# Patient Record
Sex: Female | Born: 1950 | Race: White | Hispanic: No | Marital: Married | Smoking: Never smoker
Health system: Southern US, Community
[De-identification: ages and names within clinical notes are randomized; demographics above are authoritative.]

## PROBLEM LIST (undated history)

## (undated) DIAGNOSIS — E119 Type 2 diabetes mellitus without complications: Secondary | ICD-10-CM

## (undated) DIAGNOSIS — Z8719 Personal history of other diseases of the digestive system: Secondary | ICD-10-CM

## (undated) DIAGNOSIS — E785 Hyperlipidemia, unspecified: Secondary | ICD-10-CM

## (undated) DIAGNOSIS — K5790 Diverticulosis of intestine, part unspecified, without perforation or abscess without bleeding: Secondary | ICD-10-CM

## (undated) HISTORY — DX: Diverticulosis of intestine, part unspecified, without perforation or abscess without bleeding: K57.90

## (undated) HISTORY — DX: Type 2 diabetes mellitus without complications: E11.9

## (undated) HISTORY — DX: Personal history of other diseases of the digestive system: Z87.19

## (undated) HISTORY — DX: Hyperlipidemia, unspecified: E78.5

## (undated) HISTORY — PX: CHOLECYSTECTOMY: SHX55

---

## 1998-06-14 ENCOUNTER — Ambulatory Visit (HOSPITAL_COMMUNITY): Admission: RE | Admit: 1998-06-14 | Discharge: 1998-06-14 | Payer: Self-pay | Admitting: Obstetrics and Gynecology

## 1998-06-14 ENCOUNTER — Encounter: Payer: Self-pay | Admitting: Obstetrics and Gynecology

## 1999-08-15 ENCOUNTER — Encounter: Payer: Self-pay | Admitting: Obstetrics and Gynecology

## 1999-08-15 ENCOUNTER — Ambulatory Visit (HOSPITAL_COMMUNITY): Admission: RE | Admit: 1999-08-15 | Discharge: 1999-08-15 | Payer: Self-pay | Admitting: Obstetrics and Gynecology

## 1999-08-23 ENCOUNTER — Encounter: Payer: Self-pay | Admitting: Obstetrics and Gynecology

## 1999-08-23 ENCOUNTER — Ambulatory Visit (HOSPITAL_COMMUNITY): Admission: RE | Admit: 1999-08-23 | Discharge: 1999-08-23 | Payer: Self-pay

## 1999-11-16 ENCOUNTER — Other Ambulatory Visit: Admission: RE | Admit: 1999-11-16 | Discharge: 1999-11-16 | Payer: Self-pay | Admitting: Obstetrics and Gynecology

## 2000-10-01 ENCOUNTER — Ambulatory Visit (HOSPITAL_COMMUNITY): Admission: RE | Admit: 2000-10-01 | Discharge: 2000-10-01 | Payer: Self-pay | Admitting: Obstetrics and Gynecology

## 2000-10-01 ENCOUNTER — Encounter: Payer: Self-pay | Admitting: Obstetrics and Gynecology

## 2000-11-10 ENCOUNTER — Other Ambulatory Visit: Admission: RE | Admit: 2000-11-10 | Discharge: 2000-11-10 | Payer: Self-pay | Admitting: Obstetrics and Gynecology

## 2001-08-12 HISTORY — PX: OTHER SURGICAL HISTORY: SHX169

## 2001-11-16 ENCOUNTER — Other Ambulatory Visit: Admission: RE | Admit: 2001-11-16 | Discharge: 2001-11-16 | Payer: Self-pay | Admitting: Obstetrics and Gynecology

## 2002-11-11 ENCOUNTER — Other Ambulatory Visit: Admission: RE | Admit: 2002-11-11 | Discharge: 2002-11-11 | Payer: Self-pay | Admitting: Obstetrics and Gynecology

## 2003-08-13 HISTORY — PX: COLONOSCOPY: SHX174

## 2003-09-02 ENCOUNTER — Encounter (INDEPENDENT_AMBULATORY_CARE_PROVIDER_SITE_OTHER): Payer: Self-pay | Admitting: Specialist

## 2003-09-02 ENCOUNTER — Ambulatory Visit (HOSPITAL_COMMUNITY): Admission: RE | Admit: 2003-09-02 | Discharge: 2003-09-02 | Payer: Self-pay | Admitting: *Deleted

## 2003-11-14 ENCOUNTER — Other Ambulatory Visit: Admission: RE | Admit: 2003-11-14 | Discharge: 2003-11-14 | Payer: Self-pay | Admitting: Obstetrics and Gynecology

## 2004-11-14 ENCOUNTER — Other Ambulatory Visit: Admission: RE | Admit: 2004-11-14 | Discharge: 2004-11-14 | Payer: Self-pay | Admitting: *Deleted

## 2005-11-18 ENCOUNTER — Other Ambulatory Visit: Admission: RE | Admit: 2005-11-18 | Discharge: 2005-11-18 | Payer: Self-pay | Admitting: Obstetrics & Gynecology

## 2006-11-24 ENCOUNTER — Other Ambulatory Visit: Admission: RE | Admit: 2006-11-24 | Discharge: 2006-11-24 | Payer: Self-pay | Admitting: Obstetrics & Gynecology

## 2007-04-08 ENCOUNTER — Ambulatory Visit: Payer: Self-pay | Admitting: Gastroenterology

## 2007-04-16 ENCOUNTER — Encounter: Payer: Self-pay | Admitting: Gastroenterology

## 2007-04-16 ENCOUNTER — Ambulatory Visit (HOSPITAL_COMMUNITY): Admission: RE | Admit: 2007-04-16 | Discharge: 2007-04-16 | Payer: Self-pay | Admitting: Gastroenterology

## 2007-04-24 ENCOUNTER — Ambulatory Visit: Payer: Self-pay | Admitting: Gastroenterology

## 2007-11-09 DIAGNOSIS — E118 Type 2 diabetes mellitus with unspecified complications: Secondary | ICD-10-CM | POA: Insufficient documentation

## 2007-11-09 DIAGNOSIS — Z8719 Personal history of other diseases of the digestive system: Secondary | ICD-10-CM | POA: Insufficient documentation

## 2007-11-09 DIAGNOSIS — E119 Type 2 diabetes mellitus without complications: Secondary | ICD-10-CM

## 2007-11-09 DIAGNOSIS — K222 Esophageal obstruction: Secondary | ICD-10-CM | POA: Insufficient documentation

## 2007-11-09 HISTORY — DX: Personal history of other diseases of the digestive system: Z87.19

## 2007-11-09 HISTORY — DX: Type 2 diabetes mellitus without complications: E11.9

## 2007-11-26 ENCOUNTER — Other Ambulatory Visit: Admission: RE | Admit: 2007-11-26 | Discharge: 2007-11-26 | Payer: Self-pay | Admitting: Obstetrics & Gynecology

## 2008-12-13 ENCOUNTER — Other Ambulatory Visit: Admission: RE | Admit: 2008-12-13 | Discharge: 2008-12-13 | Payer: Self-pay | Admitting: Obstetrics and Gynecology

## 2010-12-25 NOTE — Assessment & Plan Note (Signed)
[facility]                         GASTROENTEROLOGY OFFICE NOTE   NAME:Ann Griffith, Ann Griffith                      MRN:          [id number]  DATE:04/08/2007                            DOB:          Mar 17, 1951    PRIMARY CARE PHYSICIAN:  Dr. Rema Fendt at [facility].   REASON FOR VISIT:  Dysphagia.   HISTORY OF PRESENT ILLNESS:  Ann Griffith is a very pleasant 60 year old  woman who has had at least 4 years of mild intermittent dysphagia. This  is solid food only. She says it happens approximately 10 times per year,  does not seem to be getting more frequent, but she did have a more  severe episode about a week ago where the food took about 15 minutes to  clear once it was stuck in her lower esophagus as it usually gets stuck.  She ended up vomiting up and that relieved the symptoms. She has no GERD  symptoms. She is never on PPIs.   REVIEW OF SYSTEMS:  Her weight is essentially stable over the past year.  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   PAST MEDICAL HISTORY:  Status post cholecystectomy in 1976, diabetes for  a year and a half.   CURRENT MEDICATIONS:  Premarin, Prometrium, fish oil.   ALLERGIES:  AMOXICILLIN.   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 Non  smoker, drinks alcohol intermittently.   FAMILY HISTORY:  No colon cancer, colon polyps in family. She herself  had a colonoscopy in 2005. She does not remember who did it but she was  told she had polyps.   PHYSICAL EXAMINATION:  She is 5 feet 7 inches, 191 pounds, blood  pressure 122/78, pulse 68.  CONSTITUTIONALLY: Generally well-appearing.  NEUROLOGIC: Alert and oriented x3.  EYES: Extraocular movements intact.  MOUTH: Oropharynx moist, no lesions.  NECK: Supple, no lymphadenopathy.  CARDIOVASCULAR: HEART: Regular rate and rhythm.  LUNGS: Clear to auscultation bilaterally.  ABDOMEN:  Soft, nontender, nondistended.  EXTREMITIES: No lower extremity edema.  SKIN: No rashes or lesions on visible extremities.   ASSESSMENT/PLAN:  A 60 year old woman with non progressive intermittent  solid food dysphagia for at least 4 years.   I suspect this is a benign etiology from peptic stricturing or perhaps a  lower esophageal ring such as a Schatzki's ring. I see no reason for any  further blood test or imaging studies and I will proceed directly with  upper endoscopy at her soonest convenience, and we will plan for balloon  dilation at that time. She does not want to do this for 2 weeks. She is  going on vacation. I instructed her that in the meantime she should chew  her food as carefully as possible and try to eat slowly. Since she has  no gastroesophageal reflux disease symptoms, I would not put her on a  PPI unless I see esophagitis at the time of her upper endoscopy.     Rachael Fee, MD  Electronically Signed    DPJ/MedQ  DD:  04/08/2007  DT: 04/09/2007  Job #: [id number]

## 2011-04-24 DIAGNOSIS — K635 Polyp of colon: Secondary | ICD-10-CM | POA: Insufficient documentation

## 2011-04-24 DIAGNOSIS — E1169 Type 2 diabetes mellitus with other specified complication: Secondary | ICD-10-CM | POA: Insufficient documentation

## 2011-05-17 LAB — TSH: TSH: 2.9 u[IU]/mL (ref <=5.90)

## 2011-12-11 LAB — LIPID PANEL: Cholesterol: 228 mg/dL — AB (ref 0–200)

## 2011-12-31 LAB — LIPID PANEL
LDL Cholesterol: 156 mg/dL
Triglycerides: 68 mg/dL (ref 40–160)

## 2011-12-31 LAB — HEMOGLOBIN A1C: Hgb A1c MFr Bld: 6.6 % — AB (ref 4.0–6.0)

## 2011-12-31 LAB — BASIC METABOLIC PANEL: Glucose: 133 mg/dL

## 2011-12-31 LAB — CBC AND DIFFERENTIAL: WBC: 5.8 10^3/mL

## 2012-03-30 ENCOUNTER — Ambulatory Visit (INDEPENDENT_AMBULATORY_CARE_PROVIDER_SITE_OTHER): Payer: 59 | Admitting: Family Medicine

## 2012-03-30 ENCOUNTER — Encounter: Payer: Self-pay | Admitting: Family Medicine

## 2012-03-30 VITALS — BP 124/66 | HR 86 | Temp 98.6°F | Resp 16 | Ht 66.5 in | Wt 197.0 lb

## 2012-03-30 DIAGNOSIS — G5600 Carpal tunnel syndrome, unspecified upper limb: Secondary | ICD-10-CM

## 2012-03-30 DIAGNOSIS — G5603 Carpal tunnel syndrome, bilateral upper limbs: Secondary | ICD-10-CM

## 2012-03-30 NOTE — Patient Instructions (Signed)
Given handout from Sports Med HEP book.

## 2012-03-30 NOTE — Progress Notes (Signed)
CC: Ann Griffith is a 61 y.o. female is here for Establish Care and Hand Pain   Subjective: HPI: New patient visit to establish care and do to bilateral hand pain.   bilateral hand pain however left side much more bothersome than the right. Pain is located in the thenar eminence often radiating distally into the thumb. Pain is described only as a discomfort. Pain is worse when painting or doing a bit of movements such as typing or tearing at her work. When pain is at its worst she feels weakness with flexion of the thumb and has trouble opening bottles. Admits to waking in the morning with numb sensation in the thenar region of her hand. No other discomfort, nor motor or sensory disturbances in either hand. This is been present for matter of months and seems to fluctuate on a weekly basis. She believes that she had similar pain in the past and wore a brace however unsure whether or not this helped. No other interventions as of yet. She denies any neck pain, motor sensory disturbances in the upper extremities, elbow pain, skin changes at the site of her discomfort, nor muscle atrophy in the affected hands.   Review Of Systems Outlined In HPI  Past Medical History  Diagnosis Date  . DIABETES MELLITUS, TYPE II 11/09/2007    Qualifier: Diagnosis of  By: Alesia Richards    . CHOLELITHIASIS, HX OF 11/09/2007    Qualifier: Diagnosis of  By: Alesia Richards       History reviewed. No pertinent family history.   History  Substance Use Topics  . Smoking status: Never Smoker   . Smokeless tobacco: Never Used  . Alcohol Use: No     Objective: Filed Vitals:   03/30/12 1534  BP: 124/66  Pulse: 86  Temp: 98.6 F (37 C)  Resp: 16    General: Alert and Oriented, No Acute Distress Neck: Full ROM without pain.   Extremities: No peripheral edema.  Strong peripheral pulses. No muscle atrophy of hands.  Full ROM and strength in bilateral hands/wrists without pain.  No joint enlargement  nor redness/warmth.  Negative finklestein's, no pain in anatomical snuff box.  Negative tinel's, negative Phalen's. Skin: Warm and dry.  Assessment & Plan: Ann Griffith was seen today for establish care and hand pain.  Diagnoses and associated orders for this visit:  Bilateral carpal tunnel syndrome  Discussed with patient that her symptoms and history are most consistent with carpal tunnel syndrome, mild suspicion of osteoarthritis. She was fitted with a wrist brace on the left wrist to immobilize the wrist at night, but can be worn all hours of the day it helping symptoms. We will use this as a diagnostic tool also comparing potential improvement to the right wrist not receiving intervention. Stretching exercises demonstrated and she was given a handout. If not improving in 2-3 weeks we'll consider nerve conduction studies to rule in or out carpal tunnel syndrome prior to more aggressive therapy.   Return in about 2 weeks (around 04/13/2012).  Requested Prescriptions    No prescriptions requested or ordered in this encounter

## 2012-03-31 ENCOUNTER — Encounter: Payer: Self-pay | Admitting: Family Medicine

## 2012-04-14 ENCOUNTER — Ambulatory Visit (INDEPENDENT_AMBULATORY_CARE_PROVIDER_SITE_OTHER): Payer: 59 | Admitting: Family Medicine

## 2012-04-14 ENCOUNTER — Encounter: Payer: Self-pay | Admitting: Family Medicine

## 2012-04-14 ENCOUNTER — Ambulatory Visit (INDEPENDENT_AMBULATORY_CARE_PROVIDER_SITE_OTHER): Payer: 59

## 2012-04-14 VITALS — BP 127/75 | HR 87 | Temp 99.0°F | Resp 16 | Wt 196.0 lb

## 2012-04-14 DIAGNOSIS — M545 Low back pain, unspecified: Secondary | ICD-10-CM

## 2012-04-14 DIAGNOSIS — IMO0002 Reserved for concepts with insufficient information to code with codable children: Secondary | ICD-10-CM

## 2012-04-14 DIAGNOSIS — X500XXA Overexertion from strenuous movement or load, initial encounter: Secondary | ICD-10-CM

## 2012-04-14 DIAGNOSIS — S39012A Strain of muscle, fascia and tendon of lower back, initial encounter: Secondary | ICD-10-CM

## 2012-04-14 DIAGNOSIS — M5137 Other intervertebral disc degeneration, lumbosacral region: Secondary | ICD-10-CM

## 2012-04-14 MED ORDER — HYDROCODONE-ACETAMINOPHEN 5-500 MG PO TABS: 1.0000 | ORAL_TABLET | Freq: Every evening | ORAL | Status: DC | PRN

## 2012-04-14 MED ORDER — HYDROCODONE-ACETAMINOPHEN 5-500 MG PO TABS: 1.0000 | ORAL_TABLET | Freq: Every evening | ORAL | Status: AC | PRN

## 2012-04-14 MED ORDER — METAXALONE 800 MG PO TABS: 800.0000 mg | ORAL_TABLET | Freq: Three times a day (TID) | ORAL | Status: AC | PRN

## 2012-04-14 MED ORDER — METAXALONE 800 MG PO TABS: 800.0000 mg | ORAL_TABLET | Freq: Three times a day (TID) | ORAL | Status: DC | PRN

## 2012-04-14 NOTE — Progress Notes (Signed)
CC: Ann Griffith is a 61 y.o. female is here for Back Pain   Subjective: HPI:  Left low back pain which came on immediately after she was carrying a vacuum cleaner down a flight of stairs. Pain has mild radiation of pain around the left buttock and down the back and side of the proximal left thigh no further than the knee. Pain is exacerbated by lying flat or with bending forward. Pain is been bothersome enough to where it gets in the way of her sleep. She's been using a heating pad without much help of her symptoms. She's never had this before. The pain is described as sharp. Presently 24 hours a day severity 10 out of 10 however she does feel that there is been some mild improvement from this morning now without any specific intervention. She denies any motor or sensory disturbances in the lower extremities, denies saddle paresthesia, bowel or bladder incontinence, knee pain, groin pain, hip pain, rash the site of her discomfort, GI discomfort, nor any urologic abnormalities.   Review Of Systems Outlined In HPI  Past Medical History  Diagnosis Date  . DIABETES MELLITUS, TYPE II 11/09/2007    Qualifier: Diagnosis of  By: Alesia Richards    . CHOLELITHIASIS, HX OF 11/09/2007    Qualifier: Diagnosis of  By: Alesia Richards       Family History  Problem Relation Age of Onset  . Diabetes Mother      History  Substance Use Topics  . Smoking status: Never Smoker   . Smokeless tobacco: Never Used  . Alcohol Use: No     Objective: Filed Vitals:   04/14/12 1510  BP: 127/75  Pulse: 87  Temp: 99 F (37.2 C)  Resp: 16    General: Alert and Oriented, No Acute Distress Cardiac: Regular rate and rhythm.  Abdomen: Soft nontender Extremities: No peripheral edema.  Strong peripheral pulses.  MSK: Negative straight leg raise, negative log roll, no pain with internal or external rotation of the femur, stork test positive on the left, FABER negative. Full range of motion and  strength in both lower extremities. Normal gait. Skin: Warm and dry.  Assessment & Plan: [name] was seen today for back pain.  Diagnoses and associated orders for this visit:  Left low back pain - DG Lumbar Spine Complete; Future  Back strain - Discontinue: HYDROcodone-acetaminophen (VICODIN) 5-500 MG per tablet; Take 1 tablet by mouth at bedtime as needed for pain. - Discontinue: metaxalone (SKELAXIN) 800 MG tablet; Take 1 tablet (800 mg total) by mouth 3 (three) times daily as needed for pain. - HYDROcodone-acetaminophen (VICODIN) 5-500 MG per tablet; Take 1 tablet by mouth at bedtime as needed for pain. - metaxalone (SKELAXIN) 800 MG tablet; Take 1 tablet (800 mg total) by mouth 3 (three) times daily as needed for pain.    Plain films of the back not showing any acute bony abnormality. We'll treat as muscle/back strain sprain with above medications, encourage patient to stay relatively active as tolerated. Signs and symptoms requring emergent/urgent reevaluation were discussed with the patient. Return in 2 weeks to ensure improvement.  Return in about 2 weeks (around 04/28/2012).  Requested Prescriptions   Signed Prescriptions Disp Refills  . HYDROcodone-acetaminophen (VICODIN) 5-500 MG per tablet 30 tablet 0    Sig: Take 1 tablet by mouth at bedtime as needed for pain.  . metaxalone (SKELAXIN) 800 MG tablet 40 tablet 1    Sig: Take 1 tablet (800 mg total) by  mouth 3 (three) times daily as needed for pain.

## 2012-04-15 ENCOUNTER — Encounter: Payer: Self-pay | Admitting: Family Medicine

## 2012-04-16 ENCOUNTER — Encounter: Payer: Self-pay | Admitting: Family Medicine

## 2012-06-23 ENCOUNTER — Encounter: Payer: Self-pay | Admitting: Family Medicine

## 2012-06-23 DIAGNOSIS — E119 Type 2 diabetes mellitus without complications: Secondary | ICD-10-CM

## 2012-10-09 DIAGNOSIS — E559 Vitamin D deficiency, unspecified: Secondary | ICD-10-CM | POA: Insufficient documentation

## 2013-01-14 ENCOUNTER — Encounter: Payer: Self-pay | Admitting: *Deleted

## 2013-01-15 ENCOUNTER — Encounter: Payer: Self-pay | Admitting: Nurse Practitioner

## 2013-01-15 ENCOUNTER — Ambulatory Visit (INDEPENDENT_AMBULATORY_CARE_PROVIDER_SITE_OTHER): Payer: 59 | Admitting: Nurse Practitioner

## 2013-01-15 VITALS — BP 138/70 | HR 74 | Resp 14 | Ht 66.5 in | Wt 196.6 lb

## 2013-01-15 DIAGNOSIS — N952 Postmenopausal atrophic vaginitis: Secondary | ICD-10-CM

## 2013-01-15 DIAGNOSIS — N898 Other specified noninflammatory disorders of vagina: Secondary | ICD-10-CM

## 2013-01-15 MED ORDER — FLUCONAZOLE 150 MG PO TABS: 150.0000 mg | ORAL_TABLET | Freq: Once | ORAL | Status: DC

## 2013-01-15 MED ORDER — ESTRADIOL 10 MCG VA TABS: 1.0000 | ORAL_TABLET | VAGINAL | Status: DC

## 2013-01-15 NOTE — Progress Notes (Signed)
Subjective:     Patient ID: Ann Griffith, female   DOB: 08-19-1950, 62 y.o.   MRN: [id number]  Vaginal Pain The patient's primary symptoms include genital itching. The current episode started in the past 7 days. The problem occurs intermittently. The problem has been waxing and waning. Pertinent negatives include no chills, dysuria, fever, flank pain, frequency, hematuria, nausea, urgency or vomiting. The symptoms are aggravated by intercourse. She has tried antifungals for the symptoms. The treatment provided moderate relief. She is postmenopausal.   Symptoms of vaginal itching and discharge that she used OTC Monistat for several wk's ago.  symptoms were improved then again after sexual activity had discomfort with slight itching again. She feels dryness with sex and feels like 'cuts' in the vagina. Uses lubrication prn. She is on HRT.  Getting ready to go to [place] for 2 wk's this weekend and wants to make sure no problems while she is gone. No change in partners.  Review of Systems  Constitutional: Negative.  Negative for fever and chills.  Respiratory: Negative.   Gastrointestinal: Negative.  Negative for nausea and vomiting.  Genitourinary: Positive for vaginal pain and dyspareunia. Negative for dysuria, urgency, frequency, hematuria and flank pain.       Symptoms are related to dyspareunia.  Neurological: Negative.   Psychiatric/Behavioral: Negative.        Objective:   Physical Exam  Constitutional: She is oriented to person, place, and time. She appears well-developed and well-nourished.  Pulmonary/Chest: Effort normal.  Abdominal: Soft. She exhibits no distension and no mass. There is no tenderness. There is no rebound and no guarding.  Genitourinary:  Very small amount of vaginal disharge.  Ph 4.5, NSS - atrophic changes no clue cells, KOH no yeast.  Neurological: She is alert and oriented to person, place, and time.  Psychiatric: She has a normal mood and affect. Her behavior  is normal. Judgment and thought content normal.       Assessment:    Atrophic Vaginitis Recent Yeast vaginitis    Plan:     Samples of Vagifem 2 X week until AEX in about a month Rx. For Diflucan to take with her on trip in case of a recurrent yeast infection Call back if symptoms worsens

## 2013-01-15 NOTE — Patient Instructions (Signed)
Call back if symptoms worsens  

## 2013-01-18 NOTE — Progress Notes (Signed)
Reviewed personally.  M. Suzanne Govani Radloff, MD.  

## 2013-04-21 ENCOUNTER — Ambulatory Visit: Payer: Self-pay | Admitting: Obstetrics & Gynecology

## 2013-04-23 ENCOUNTER — Encounter: Payer: Self-pay | Admitting: Obstetrics & Gynecology

## 2013-04-23 ENCOUNTER — Ambulatory Visit (INDEPENDENT_AMBULATORY_CARE_PROVIDER_SITE_OTHER): Payer: 59 | Admitting: Obstetrics & Gynecology

## 2013-04-23 VITALS — BP 138/70 | HR 60 | Resp 20 | Ht 66.5 in | Wt 198.0 lb

## 2013-04-23 DIAGNOSIS — E119 Type 2 diabetes mellitus without complications: Secondary | ICD-10-CM

## 2013-04-23 DIAGNOSIS — Z0000 Encounter for general adult medical examination without abnormal findings: Secondary | ICD-10-CM

## 2013-04-23 DIAGNOSIS — R6882 Decreased libido: Secondary | ICD-10-CM

## 2013-04-23 DIAGNOSIS — Z01419 Encounter for gynecological examination (general) (routine) without abnormal findings: Secondary | ICD-10-CM

## 2013-04-23 LAB — COMPREHENSIVE METABOLIC PANEL
Albumin: 4 g/dL (ref 3.5–5.2)
Alkaline Phosphatase: 40 U/L (ref 39–117)
BUN: 16 mg/dL (ref 6–23)
Calcium: 8.9 mg/dL (ref 8.4–10.5)
Chloride: 105 mEq/L (ref 96–112)
Glucose, Bld: 144 mg/dL — ABNORMAL HIGH (ref 70–99)
Potassium: 4.2 mEq/L (ref 3.5–5.3)

## 2013-04-23 LAB — HEMOGLOBIN A1C
Hgb A1c MFr Bld: 7.6 % — ABNORMAL HIGH (ref <5.7)
Mean Plasma Glucose: 171 mg/dL — ABNORMAL HIGH (ref <117)

## 2013-04-23 LAB — POCT URINALYSIS DIPSTICK
Bilirubin, UA: NEGATIVE
Ketones, UA: NEGATIVE
pH, UA: 5

## 2013-04-23 LAB — LIPID PANEL
HDL: 62 mg/dL (ref >39)
LDL Cholesterol: 129 mg/dL — ABNORMAL HIGH (ref 0–99)
Triglycerides: 76 mg/dL (ref <150)

## 2013-04-23 MED ORDER — TESTOSTERONE PROPIONATE 2 % TD OINT: TOPICAL_OINTMENT | TRANSDERMAL | Status: DC

## 2013-04-23 MED ORDER — ESTRADIOL-NORETHINDRONE ACET 1-0.5 MG PO TABS: 1.0000 | ORAL_TABLET | Freq: Every day | ORAL | Status: DC

## 2013-04-23 NOTE — Progress Notes (Signed)
62 y.o. G1P1 MarriedCaucasianF here for annual exam.  Noticing vulvar itching.  Tried olive oil, vagisil, vit E lotion, and OTC product for itching.  Nothing helped.  No bleeding or discharge.  No odor.    Patient's last menstrual period was 08/12/2000.          Sexually active: yes  The current method of family planning is none.    Exercising: no  not regularly Smoker:  no  Health Maintenance: Pap:  03/26/12 WNL/negative HR HPV History of abnormal Pap:  no MMG:  01/28/12 normal/aware due, will make appointment Colonoscopy:  2009-scheduled for 10/14 BMD:   6/09 (normal) TDaP:  12/21/09 Zostavax: done Screening Labs: done today, Hb today: 12.5, Urine today: WBC-+1, RBC-+1   reports that she has never smoked. She has never used smokeless tobacco. She reports that she drinks about 0.5 ounces of alcohol per week. She reports that she does not use illicit drugs.  Past Medical History  Diagnosis Date  . DIABETES MELLITUS, TYPE II 11/09/2007    Qualifier: Diagnosis of  By: Alesia Richards    . CHOLELITHIASIS, HX OF 11/09/2007    Qualifier: Diagnosis of  By: Alesia Richards    . Hyperlipidemia   . Diverticulosis     Past Surgical History  Procedure Laterality Date  . Cholecystectomy    . Colonoscopy  08/2003  . Bone spur removed  2003    left heel    Current Outpatient Prescriptions  Medication Sig Dispense Refill  . aspirin 81 MG tablet Take 81 mg by mouth daily.      . cholecalciferol (VITAMIN D) 1000 UNITS tablet Take 1,000 Units by mouth 3 (three) times daily.       . Cinnamon 500 MG capsule Take 1,000 mg by mouth daily.      . Estradiol 10 MCG TABS Place 1 tablet (10 mcg total) vaginally 2 (two) times a week. Lot # [id number] Exp. 02/2105  8 tablet  11  . estradiol-norethindrone (ACTIVELLA) 1-0.5 MG per tablet Take 1 tablet by mouth daily.      . fish oil-omega-3 fatty acids 1000 MG capsule Take 1 g by mouth daily.      . Red Yeast Rice 600 MG CAPS Take by mouth daily.       . Testosterone Propionate 2 % OINT Place onto the skin 2 (two) times a week.       No current facility-administered medications for this visit.    Family History  Problem Relation Age of Onset  . Diabetes Mother     ROS:  Pertinent items are noted in HPI.  Otherwise, a comprehensive ROS was negative.  Exam:   BP 138/70  Pulse 60  Resp 20  Ht 5' 6.5" (1.689 m)  Wt 198 lb (89.812 kg)  BMI 31.48 kg/m2  LMP 08/12/2000  Weight change: +2  Height: 5' 6.5" (168.9 cm)  Ht Readings from Last 3 Encounters:  04/23/13 5' 6.5" (1.689 m)  01/15/13 5' 6.5" (1.689 m)  03/30/12 5' 6.5" (1.689 m)    General appearance: alert, cooperative and appears stated age Head: Normocephalic, without obvious abnormality, atraumatic Neck: no adenopathy, supple, symmetrical, trachea midline and thyroid normal to inspection and palpation Lungs: clear to auscultation bilaterally Breasts: normal appearance, no masses or tenderness Heart: regular rate and rhythm Abdomen: soft, non-tender; bowel sounds normal; no masses,  no organomegaly Extremities: extremities normal, atraumatic, no cyanosis or edema Skin: Skin color, texture, turgor normal. No rashes or lesions  Lymph nodes: Cervical, supraclavicular, and axillary nodes normal. No abnormal inguinal nodes palpated Neurologic: Grossly normal   Pelvic: External genitalia:  no lesions              Urethra:  normal appearing urethra with no masses, tenderness or lesions              Bartholins and Skenes: normal                 Vagina: normal appearing vagina with normal color and discharge, no lesions              Cervix: no lesions              Pap taken: no Bimanual Exam:  Uterus:  normal size, contour, position, consistency, mobility, non-tender              Adnexa: normal adnexa and no mass, fullness, tenderness               Rectovaginal: Confirms               Anus:  normal sphincter tone, no lesions  A:  Well Woman with normal exam PMP on  HRT Diet controlled DM, PCP has moved Sun exposure--sees derm yearly Decreased libido  P:   Mammogram yearly pap smear with neg HR HPV 8/13.  No Pap today. CMP, TSH, Vit D, Lipids Total testosterone HbA1C Continue HRT 1.0/0.5mg  daily #90/4RF Topical testosterone 2% ointment twice weekly.  #60 grams/1 RF. return annually or prn  An After Visit Summary was printed and given to the patient.

## 2013-04-23 NOTE — Addendum Note (Signed)
Addended by: Jerene Bears on: 04/23/2013 02:23 PM   Modules accepted: Orders

## 2013-04-23 NOTE — Addendum Note (Signed)
Addended by: Jerene Bears on: 04/23/2013 02:25 PM   Modules accepted: Orders

## 2013-04-23 NOTE — Patient Instructions (Addendum)

## 2013-04-24 LAB — TESTOSTERONE: Testosterone: 49 ng/dL (ref 10–70)

## 2013-04-27 ENCOUNTER — Telehealth: Payer: Self-pay

## 2013-04-27 NOTE — Telephone Encounter (Signed)
[email or web address]

## 2013-04-29 ENCOUNTER — Telehealth: Payer: Self-pay

## 2013-04-29 NOTE — Telephone Encounter (Signed)
[email or web address]

## 2013-04-29 NOTE — Telephone Encounter (Signed)
Message copied by Elisha Headland on Thu Apr 29, 2013  9:25 AM ------      Message from: Jerene Bears      Created: Wed Apr 28, 2013  2:08 PM       Please call.  HbA1C 7.6.  Diabetes not controlled.  Needs PCP (old one moved).  She needs to be on medication for this.  Cholesterol Ok.  LDLs a little up but ok to watch.  TSH ok.  Vit D ok.  CMP ok. ------

## 2013-04-30 ENCOUNTER — Ambulatory Visit: Payer: 59 | Admitting: Physician Assistant

## 2013-04-30 NOTE — Telephone Encounter (Signed)
Patient did not make appointment and so it was cancelled. Appointment just needs rescheduled.

## 2013-05-03 NOTE — Telephone Encounter (Signed)
New appointment made for patient 05/05/13. Left detailed message on voicemail.

## 2013-05-04 NOTE — Telephone Encounter (Signed)
Pt has some more questions regarding her  results

## 2013-05-05 ENCOUNTER — Ambulatory Visit (INDEPENDENT_AMBULATORY_CARE_PROVIDER_SITE_OTHER): Payer: 59 | Admitting: Physician Assistant

## 2013-05-05 ENCOUNTER — Encounter: Payer: Self-pay | Admitting: Physician Assistant

## 2013-05-05 VITALS — BP 127/71 | HR 77 | Wt 201.0 lb

## 2013-05-05 DIAGNOSIS — I7300 Raynaud's syndrome without gangrene: Secondary | ICD-10-CM

## 2013-05-05 DIAGNOSIS — E119 Type 2 diabetes mellitus without complications: Secondary | ICD-10-CM

## 2013-05-05 LAB — POCT UA - MICROALBUMIN

## 2013-05-05 MED ORDER — METFORMIN HCL 500 MG PO TABS: ORAL_TABLET | ORAL | Status: DC

## 2013-05-05 MED ORDER — AMLODIPINE BESYLATE 2.5 MG PO TABS: 2.5000 mg | ORAL_TABLET | Freq: Every day | ORAL | Status: DC

## 2013-05-05 NOTE — Telephone Encounter (Signed)
Message left to return call to nurse at [phone].   

## 2013-05-05 NOTE — Patient Instructions (Addendum)
Diabetes Meal Planning Guide The diabetes meal planning guide is a tool to help you plan your meals and snacks. It is important for people with diabetes to manage their blood glucose (sugar) levels. Choosing the right foods and the right amounts throughout your day will help control your blood glucose. Eating right can even help you improve your blood pressure and reach or maintain a healthy weight. CARBOHYDRATE COUNTING MADE EASY When you eat carbohydrates, they turn to sugar. This raises your blood glucose level. Counting carbohydrates can help you control this level so you feel better. When you plan your meals by counting carbohydrates, you can have more flexibility in what you eat and balance your medicine with your food intake. Carbohydrate counting simply means adding up the total amount of carbohydrate grams in your meals and snacks. Try to eat about the same amount at each meal. Foods with carbohydrates are listed below. Each portion below is 1 carbohydrate serving or 15 grams of carbohydrates. Ask your dietician how many grams of carbohydrates you should eat at each meal or snack. Grains and Starches  1 slice bread.   English muffin or hotdog/hamburger bun.   cup cold cereal (unsweetened).   cup cooked pasta or rice.   cup starchy vegetables (corn, potatoes, peas, beans, winter squash).  1 tortilla (6 inches).   bagel.  1 waffle or pancake (size of a CD).   cup cooked cereal.  4 to 6 small crackers. *Whole grain is recommended. Fruit  1 cup fresh unsweetened berries, melon, papaya, pineapple.  1 small fresh fruit.   banana or mango.   cup fruit juice (4 oz unsweetened).   cup canned fruit in natural juice or water.  2 tbs dried fruit.  12 to 15 grapes or cherries. Milk and Yogurt  1 cup fat-free or 1% milk.  1 cup soy milk.  6 oz light yogurt with sugar-free sweetener.  6 oz low-fat soy yogurt.  6 oz plain yogurt. Vegetables  1 cup raw or  cup  cooked is counted as 0 carbohydrates or a "free" food.  If you eat 3 or more servings at 1 meal, count them as 1 carbohydrate serving. Other Carbohydrates   oz chips or pretzels.   cup ice cream or frozen yogurt.   cup sherbet or sorbet.  2 inch square cake, no frosting.  1 tbs honey, sugar, jam, jelly, or syrup.  2 small cookies.  3 squares of graham crackers.  3 cups popcorn.  6 crackers.  1 cup broth-based soup.  Count 1 cup casserole or other mixed foods as 2 carbohydrate servings.  Foods with less than 20 calories in a serving may be counted as 0 carbohydrates or a "free" food. You may want to purchase a book or computer software that lists the carbohydrate gram counts of different foods. In addition, the nutrition facts panel on the labels of the foods you eat are a good source of this information. The label will tell you how big the serving size is and the total number of carbohydrate grams you will be eating per serving. Divide this number by 15 to obtain the number of carbohydrate servings in a portion. Remember, 1 carbohydrate serving equals 15 grams of carbohydrate. SERVING SIZES Measuring foods and serving sizes helps you make sure you are getting the right amount of food. The list below tells how big or small some common serving sizes are.  1 oz.........4 stacked dice.  3 oz.........Deck of cards.  1 tsp........Tip   of little finger.  1 tbs......[name] KitchenMarland KitchenThumb.  2 tbs.......[name] KitchenGolf ball.   cup......[name] KitchenHalf of a fist.  1 cup.......[name] KitchenA fist. SAMPLE DIABETES MEAL PLAN Below is a sample meal plan that includes foods from the grain and starches, dairy, vegetable, fruit, and meat groups. A dietician can individualize a meal plan to fit your calorie needs and tell you the number of servings needed from each food group. However, controlling the total amount of carbohydrates in your meal or snack is more important than making sure you include all of the food groups at every  meal. You may interchange carbohydrate containing foods (dairy, starches, and fruits). The meal plan below is an example of a 2000 calorie diet using carbohydrate counting. This meal plan has 17 carbohydrate servings. Breakfast  1 cup oatmeal (2 carb servings).   cup light yogurt (1 carb serving).  1 cup blueberries (1 carb serving).   cup almonds. Snack  1 large apple (2 carb servings).  1 low-fat string cheese stick. Lunch  Chicken breast salad.  1 cup spinach.   cup chopped tomatoes.  2 oz chicken breast, sliced.  2 tbs low-fat [place] dressing.  12 whole-wheat crackers (2 carb servings).  12 to 15 grapes (1 carb serving).  1 cup low-fat milk (1 carb serving). Snack  1 cup carrots.   cup hummus (1 carb serving). Dinner  3 oz broiled salmon.  1 cup brown rice (3 carb servings). Snack  1  cups steamed broccoli (1 carb serving) drizzled with 1 tsp olive oil and lemon juice.  1 cup light pudding (2 carb servings). DIABETES MEAL PLANNING WORKSHEET Your dietician can use this worksheet to help you decide how many servings of foods and what types of foods are right for you.  BREAKFAST Food Group and Servings / Carb Servings Grain/Starches __________________________________ Dairy __________________________________________ Vegetable ______________________________________ Fruit ___________________________________________ Meat __________________________________________ Fat ____________________________________________ LUNCH Food Group and Servings / Carb Servings Grain/Starches ___________________________________ Dairy ___________________________________________ Fruit ____________________________________________ Meat ___________________________________________ Fat _____________________________________________ Laural Golden Food Group and Servings / Carb Servings Grain/Starches ___________________________________ Dairy  ___________________________________________ Fruit ____________________________________________ Meat ___________________________________________ Fat _____________________________________________ SNACKS Food Group and Servings / Carb Servings Grain/Starches ___________________________________ Dairy ___________________________________________ Vegetable _______________________________________ Fruit ____________________________________________ Meat ___________________________________________ Fat _____________________________________________ DAILY TOTALS Starches _________________________ Vegetable ________________________ Fruit ____________________________ Dairy ____________________________ Meat ____________________________ Fat ______________________________ Document Released: 04/25/2005 Document Revised: 10/21/2011 Document Reviewed: 03/06/2009 [employer] Patient Information 2014 [employer].   Raynaud's Syndrome Raynaud's Syndrome is a disorder of the blood vessels in your hands and feet. It occurs when small arteries of the arms/hands or legs/feet become sensitive to cold or emotional upset. This causes the arteries to constrict, or narrow, and reduces blood flow to the area. The color in the fingers or toes changes from white to bluish to red and this is not usually painful. There may be numbness and tingling. Sores on the skin (ulcers) can form. Symptoms are usually relieved by warming. HOME CARE INSTRUCTIONS   Avoid exposure to cold. Keep your whole body warm and dry. Dress in layers. Wear mittens or gloves when handling ice or frozen food and when outdoors. Use holders for glasses or cans containing cold drinks. If possible, stay indoors during cold weather.  Limit your use of caffeine. Switch to decaffeinated coffee, tea, and soda pop. Avoid chocolate.  Avoid smoking or being around cigarette smoke. Smoke will make symptoms worse.  Wear loose fitting socks and comfortable, roomy  shoes.  Avoid vibrating tools and machinery.  If possible, avoid stressful and emotional situations. Exercise, meditation and  yoga may help you cope with stress. Biofeedback may be useful.  Ask your caregiver about medicine (calcium channel blockers) that may control Raynaud's phenomena. SEEK MEDICAL CARE IF:   Your discomfort becomes worse, despite conservative treatment.  You develop sores on your fingers and toes that do not heal. Document Released: 07/26/2000 Document Revised: 10/21/2011 Document Reviewed: 08/02/2008 [facility] Patient Information 2014 [place], [place].

## 2013-05-05 NOTE — Progress Notes (Signed)
  Subjective:    Patient ID: Ann Griffith, female    DOB: 1950/10/17, 62 y.o.   MRN: [id number]  HPI Patient is a 62 year old female who presents to the clinic to discuss elevated A1c. Patient was seen by her GYN earlier this month on September 12. She had fasting labs done in A1c was elevated at 7.6. She has never been on diabetic medication. She does have a glucometer at home and checks her sugars occasionally. Her fasting sugars are usually around 140. She denies any chest pains, palpitations, vision blurriness or tingling in feet. Her last eye visit was last November. She plans on making another appointment before the end of the year. Patient wears glasses but no other abnormality seen. Patient admits to a healthy diet. She was walking regularly before the summer but has since stopped walking. She has been to the diabetic nutrition class before.  Patient does have ongoing cold toes that sometimes turn blue. She's had this for many years. Does not seem to be getting better or worse. She has never tried anything to make better.   Review of Systems     Objective:   Physical Exam  Constitutional: She is oriented to person, place, and time. She appears well-developed and well-nourished.  HENT:  Head: Normocephalic and atraumatic.  Cardiovascular: Normal rate, regular rhythm and normal heart sounds.   Pulmonary/Chest: Effort normal and breath sounds normal.  Neurological: She is alert and oriented to person, place, and time.  Skin: Skin is warm and dry.  Psychiatric: She has a normal mood and affect.          Assessment & Plan:  Diabetes mellitus type 2-A1c was 7.6 this month. Patient was started on metformin 500 mg twice a day with meals. If patient able to tolerate medication well then he can increase to 1000 mg twice a day with food. Side effects of nausea and diarrhea were discussed. Patient had kidney function recently checked and creatinine was perfect. Patient was given a handout  on diabetic meal planning. Patient was offered a nutritionist referral. Patient declined stating she hurriedly spoken with nutritionist. Encouraged patient to decrease carbohydrates and increase protein. Patient encouraged to stay active and walking regularly. Recheck A1c in 3 months. Microalbumin was done today and normal. Patient will get a flu shot at work and reports early have pneumonia vaccine.  Raynauds syndrome- started patient on low dose of amlodipine 2.5mg  to see if helps with symptoms. Follow up as needed. Gave HO.

## 2013-05-07 NOTE — Telephone Encounter (Signed)
Patient had appointment 05/05/13 with primary care. Will close encounter.

## 2013-05-07 NOTE — Telephone Encounter (Signed)
Attempted phone call. Phone rang and rang. No answer.

## 2013-06-04 ENCOUNTER — Telehealth: Payer: Self-pay | Admitting: Obstetrics & Gynecology

## 2013-06-04 NOTE — Telephone Encounter (Signed)
Left Message To Call Back  

## 2013-06-04 NOTE — Telephone Encounter (Signed)
Patient needs a refill of her hormone replacement.  [facility] [place]

## 2013-06-07 ENCOUNTER — Encounter: Payer: Self-pay | Admitting: Family Medicine

## 2013-06-07 DIAGNOSIS — K5790 Diverticulosis of intestine, part unspecified, without perforation or abscess without bleeding: Secondary | ICD-10-CM | POA: Insufficient documentation

## 2013-06-07 NOTE — Telephone Encounter (Signed)
Left Message To Call Back  Re: Patient needing refill on HRT  04/23/13 Activella #90/4 refills was sent to [facility] 04/23/13 Testosterone Propionate 2% #60 gm/1 refill was sent to [facility]

## 2013-06-09 NOTE — Telephone Encounter (Signed)
Left Message To Call Back  

## 2013-06-14 NOTE — Telephone Encounter (Signed)
Contacted patient x3 regarding patient needing a refill of HRT  04/23/13 Activella #90/4 refills was sent to [facility]  04/23/13 Testosterone Propionate 2% #60 gm/1 refill was sent to [facility] pharmacy  Please advise on next step?

## 2013-06-14 NOTE — Telephone Encounter (Signed)
You have notified her and left a message.  Refills are done.  Ok to close encounter.

## 2013-07-06 ENCOUNTER — Encounter: Payer: Self-pay | Admitting: Family Medicine

## 2013-08-09 ENCOUNTER — Ambulatory Visit (INDEPENDENT_AMBULATORY_CARE_PROVIDER_SITE_OTHER): Payer: 59

## 2013-08-09 ENCOUNTER — Encounter: Payer: Self-pay | Admitting: Physician Assistant

## 2013-08-09 ENCOUNTER — Ambulatory Visit (INDEPENDENT_AMBULATORY_CARE_PROVIDER_SITE_OTHER): Payer: 59 | Admitting: Physician Assistant

## 2013-08-09 VITALS — BP 126/69 | HR 84 | Wt 190.0 lb

## 2013-08-09 DIAGNOSIS — I7300 Raynaud's syndrome without gangrene: Secondary | ICD-10-CM

## 2013-08-09 DIAGNOSIS — M25561 Pain in right knee: Secondary | ICD-10-CM

## 2013-08-09 DIAGNOSIS — M25569 Pain in unspecified knee: Secondary | ICD-10-CM

## 2013-08-09 DIAGNOSIS — E119 Type 2 diabetes mellitus without complications: Secondary | ICD-10-CM

## 2013-08-09 MED ORDER — METFORMIN HCL 500 MG PO TABS: ORAL_TABLET | ORAL | Status: DC

## 2013-08-09 NOTE — Patient Instructions (Addendum)
Raynaud's Syndrome Raynaud's Syndrome is a disorder of the blood vessels in your hands and feet. It occurs when small arteries of the arms/hands or legs/feet become sensitive to cold or emotional upset. This causes the arteries to constrict, or narrow, and reduces blood flow to the area. The color in the fingers or toes changes from white to bluish to red and this is not usually painful. There may be numbness and tingling. Sores on the skin (ulcers) can form. Symptoms are usually relieved by warming. HOME CARE INSTRUCTIONS   Avoid exposure to cold. Keep your whole body warm and dry. Dress in layers. Wear mittens or gloves when handling ice or frozen food and when outdoors. Use holders for glasses or cans containing cold drinks. If possible, stay indoors during cold weather.  Limit your use of caffeine. Switch to decaffeinated coffee, tea, and soda pop. Avoid chocolate.  Avoid smoking or being around cigarette smoke. Smoke will make symptoms worse.  Wear loose fitting socks and comfortable, roomy shoes.  Avoid vibrating tools and machinery.  If possible, avoid stressful and emotional situations. Exercise, meditation and yoga may help you cope with stress. Biofeedback may be useful.  Ask your caregiver about medicine (calcium channel blockers) that may control Raynaud's phenomena. SEEK MEDICAL CARE IF:   Your discomfort becomes worse, despite conservative treatment.  You develop sores on your fingers and toes that do not heal. Document Released: 07/26/2000 Document Revised: 10/21/2011 Document Reviewed: 08/02/2008 [facility] Patient Information 2014 [place], [place].  Knee Exercises EXERCISES RANGE OF MOTION(ROM) AND STRETCHING EXERCISES These exercises may help you when beginning to rehabilitate your injury. Your symptoms may resolve with or without further involvement from your physician, physical therapist or athletic trainer. While completing these exercises, remember:   Restoring tissue  flexibility helps normal motion to return to the joints. This allows healthier, less painful movement and activity.  An effective stretch should be held for at least 30 seconds.  A stretch should never be painful. You should only feel a gentle lengthening or release in the stretched tissue. STRETCH - Knee Extension, Prone  Lie on your stomach on a firm surface, such as a bed or countertop. Place your right / left knee and leg just beyond the edge of the surface. You may wish to place a towel under the far end of your right / left thigh for comfort.  Relax your leg muscles and allow gravity to straighten your knee. Your clinician may advise you to add an ankle weight if more resistance is helpful for you.  You should feel a stretch in the back of your right / left knee. Hold this position for __________ seconds. Repeat __________ times. Complete this stretch __________ times per day. * Your physician, physical therapist or athletic trainer may ask you to add ankle weight to enhance your stretch.  RANGE OF MOTION - Knee Flexion, Active  Lie on your back with both knees straight. (If this causes back discomfort, bend your opposite knee, placing your foot flat on the floor.)  Slowly slide your heel back toward your buttocks until you feel a gentle stretch in the front of your knee or thigh.  Hold for __________ seconds. Slowly slide your heel back to the starting position. Repeat __________ times. Complete this exercise __________ times per day.  STRETCH - Quadriceps, Prone   Lie on your stomach on a firm surface, such as a bed or padded floor.  Bend your right / left knee and grasp your ankle. If  you are unable to reach, your ankle or pant leg, use a belt around your foot to lengthen your reach.  Gently pull your heel toward your buttocks. Your knee should not slide out to the side. You should feel a stretch in the front of your thigh and/or knee.  Hold this position for __________  seconds. Repeat __________ times. Complete this stretch __________ times per day.  STRETCH  Hamstrings, Supine   Lie on your back. Loop a belt or towel over the ball of your right / left foot.  Straighten your right / left knee and slowly pull on the belt to raise your leg. Do not allow the right / left knee to bend. Keep your opposite leg flat on the floor.  Raise the leg until you feel a gentle stretch behind your right / left knee or thigh. Hold this position for __________ seconds. Repeat __________ times. Complete this stretch __________ times per day.  STRENGTHENING EXERCISES These exercises may help you when beginning to rehabilitate your injury. They may resolve your symptoms with or without further involvement from your physician, physical therapist or athletic trainer. While completing these exercises, remember:   Muscles can gain both the endurance and the strength needed for everyday activities through controlled exercises.  Complete these exercises as instructed by your physician, physical therapist or athletic trainer. Progress the resistance and repetitions only as guided.  You may experience muscle soreness or fatigue, but the pain or discomfort you are trying to eliminate should never worsen during these exercises. If this pain does worsen, stop and make certain you are following the directions exactly. If the pain is still present after adjustments, discontinue the exercise until you can discuss the trouble with your clinician. STRENGTH - Quadriceps, Isometrics  Lie on your back with your right / left leg extended and your opposite knee bent.  Gradually tense the muscles in the front of your right / left thigh. You should see either your knee cap slide up toward your hip or increased dimpling just above the knee. This motion will push the back of the knee down toward the floor/mat/bed on which you are lying.  Hold the muscle as tight as you can without increasing your pain  for __________ seconds.  Relax the muscles slowly and completely in between each repetition. Repeat __________ times. Complete this exercise __________ times per day.  STRENGTH - Quadriceps, Short Arcs   Lie on your back. Place a __________ inch towel roll under your knee so that the knee slightly bends.  Raise only your lower leg by tightening the muscles in the front of your thigh. Do not allow your thigh to rise.  Hold this position for __________ seconds. Repeat __________ times. Complete this exercise __________ times per day.  OPTIONAL ANKLE WEIGHTS: Begin with ____________________, but DO NOT exceed ____________________. Increase in 1 pound/0.5 kilogram increments.  STRENGTH - Quadriceps, Straight Leg Raises  Quality counts! Watch for signs that the quadriceps muscle is working to insure you are strengthening the correct muscles and not "cheating" by substituting with healthier muscles.  Lay on your back with your right / left leg extended and your opposite knee bent.  Tense the muscles in the front of your right / left thigh. You should see either your knee cap slide up or increased dimpling just above the knee. Your thigh may even quiver.  Tighten these muscles even more and raise your leg 4 to 6 inches off the floor. Hold for __________ seconds.  Keeping these muscles tense, lower your leg.  Relax the muscles slowly and completely in between each repetition. Repeat __________ times. Complete this exercise __________ times per day.  STRENGTH - Hamstring, Curls  Lay on your stomach with your legs extended. (If you lay on a bed, your feet may hang over the edge.)  Tighten the muscles in the back of your thigh to bend your right / left knee up to 90 degrees. Keep your hips flat on the bed/floor.  Hold this position for __________ seconds.  Slowly lower your leg back to the starting position. Repeat __________ times. Complete this exercise __________ times per day.  OPTIONAL  ANKLE WEIGHTS: Begin with ____________________, but DO NOT exceed ____________________. Increase in 1 pound/0.5 kilogram increments.  STRENGTH  Quadriceps, Squats  Stand in a door frame so that your feet and knees are in line with the frame.  Use your hands for balance, not support, on the frame.  Slowly lower your weight, bending at the hips and knees. Keep your lower legs upright so that they are parallel with the door frame. Squat only within the range that does not increase your knee pain. Never let your hips drop below your knees.  Slowly return upright, pushing with your legs, not pulling with your hands. Repeat __________ times. Complete this exercise __________ times per day.  STRENGTH - Quadriceps, Wall Slides  Follow guidelines for form closely. Increased knee pain often results from poorly placed feet or knees.  Lean against a smooth wall or door and walk your feet out 18-24 inches. Place your feet hip-width apart.  Slowly slide down the wall or door until your knees bend __________ degrees.* Keep your knees over your heels, not your toes, and in line with your hips, not falling to either side.  Hold for __________ seconds. Stand up to rest for __________ seconds in between each repetition. Repeat __________ times. Complete this exercise __________ times per day. * Your physician, physical therapist or athletic trainer will alter this angle based on your symptoms and progress. Document Released: 06/12/2005 Document Revised: 10/21/2011 Document Reviewed: 11/10/2008 [facility] Patient Information 2014 [place], [place].

## 2013-08-09 NOTE — Progress Notes (Signed)
   Subjective:    Patient ID: Ann Griffith, female    DOB: July 26, 1951, 62 y.o.   MRN: [id number]  HPI Pt presents to the clinic to follow up on Diabetes and discuss right knee pain.   DM- pt is taking 1000mg  metformin twice a day. She had some GI SE at first but they improved signifcantly after a couple of weeks. Pt has been eating much less and trying to make much better choices. She has lost 11lbs in 3 months. She continues to check her sugars mostly fasting and have been around 120 and sometimes less. Continues to have toes turning blue and very cold. Using amdopline and not noticed a lot of difference but with medication her feet have not hurt like they were.   Right knee pain started approximately 16 months ago when she slid outside. She never had knee evaluated. She is concerned that she is off and on discomfort of the lateral right knee. This past week he felt like it was bothering her more. Sometimes there is shooting pain in the lateral side of the to. She denies any ever giving way or he'll not she's going to fall. Nothing seems to make worse. She has not tried anything to make better. She has not taken any ibuprofen because she does not like to take medication.   Review of Systems     Objective:   Physical Exam  Constitutional: She is oriented to person, place, and time. She appears well-developed and well-nourished.  HENT:  Head: Normocephalic and atraumatic.  Cardiovascular: Normal rate, regular rhythm and normal heart sounds.   Pulmonary/Chest: Effort normal and breath sounds normal.  Musculoskeletal:  Range of motion of right knee normal and without pain. Some mild 1+ crepitus over her right knee. Patient able to reach full extension and flexion. No significant joint tenderness to palpation. Negative McMurray's. Patient points to discomfort over the more lateral side of knee close to where the IT band inserts.  Neurological: She is alert and oriented to person, place, and  time.  Psychiatric: She has a normal mood and affect. Her behavior is normal.          Assessment & Plan:  DM-  Lab Results  Component Value Date   HGBA1C 6.3 08/09/2013  down from 7.6. Great job. Continue on metformin and work on weight loss over the next 3 months and will consider dropping and down and potentially taking off medication if needed. Sent refill today.   Raynaud's syndrome-reassured patient that Norvasc was not decreasing her blood pressure significantly. She wants to stop for a month and see if there is any real benefit in taking daily. I discussed that time if you have that let me know if she wants a refill. Gave handout with home treatment on different ways to make symptoms better.  Right knee pain-will get x-ray today. I suspect there is some arthritis and possibly some tendinitis or IT band inflammation around the knee. I sent her home with some stretches for the knee. Encouraged ibuprofen regularly for the next couple of weeks. If not improving or worsening could consider MRI of the knee or a trial of physical therapy. Followup in 6 weeks.

## 2013-10-04 ENCOUNTER — Other Ambulatory Visit: Payer: Self-pay | Admitting: *Deleted

## 2013-10-04 ENCOUNTER — Telehealth: Payer: Self-pay | Admitting: Obstetrics & Gynecology

## 2013-10-04 MED ORDER — TESTOSTERONE PROPIONATE 2 % TD OINT: TOPICAL_OINTMENT | TRANSDERMAL | Status: DC

## 2013-10-04 NOTE — Telephone Encounter (Signed)
Patient requesting a refill on testosterone.  [place] [phone]

## 2013-10-04 NOTE — Telephone Encounter (Addendum)
Last AEX and refill 04/23/2013, 60g/1 refill Next appt 05/09/14.

## 2013-10-05 NOTE — Telephone Encounter (Signed)
Patient notified that rx was sent to pharmacy  ?

## 2013-10-28 ENCOUNTER — Other Ambulatory Visit: Payer: Self-pay | Admitting: Physician Assistant

## 2013-11-05 ENCOUNTER — Encounter: Payer: Self-pay | Admitting: Physician Assistant

## 2013-11-05 ENCOUNTER — Ambulatory Visit (INDEPENDENT_AMBULATORY_CARE_PROVIDER_SITE_OTHER): Payer: 59 | Admitting: Physician Assistant

## 2013-11-05 VITALS — BP 115/61 | HR 75 | Ht 67.0 in | Wt 188.0 lb

## 2013-11-05 DIAGNOSIS — E119 Type 2 diabetes mellitus without complications: Secondary | ICD-10-CM

## 2013-11-05 DIAGNOSIS — I7300 Raynaud's syndrome without gangrene: Secondary | ICD-10-CM

## 2013-11-05 LAB — POCT GLYCOSYLATED HEMOGLOBIN (HGB A1C): HEMOGLOBIN A1C: 6.5

## 2013-11-05 MED ORDER — METFORMIN HCL 500 MG PO TABS: ORAL_TABLET | ORAL | Status: DC

## 2013-11-05 MED ORDER — AMLODIPINE BESYLATE 2.5 MG PO TABS: ORAL_TABLET | ORAL | Status: DC

## 2013-11-05 MED ORDER — AMBULATORY NON FORMULARY MEDICATION: Status: DC

## 2013-11-05 NOTE — Progress Notes (Signed)
   Subjective:    Patient ID: Ann Griffith, female    DOB: 09/12/1950, 63 y.o.   MRN: [id number]  HPI Patient presents to the clinic to followup on diabetes.  DM-patient stooled well on metformin twice a day. Patient denies any hypoglycemic events. She does not check her sugars regularly but when she does it is fasting and usually around 120. She denies any exercise but does try to keep to a low sugar, low carb diet. She denies any increased thirst, neuropathy, vision changes. She does have a cold sensation in her feet from Raynaud syndrome that Norvasc is helping.   Review of Systems     Objective:   Physical Exam  Constitutional: She is oriented to person, place, and time. She appears well-developed and well-nourished.  HENT:  Head: Normocephalic and atraumatic.  Cardiovascular: Normal rate, regular rhythm and normal heart sounds.   Pulmonary/Chest: Effort normal and breath sounds normal.  Neurological: She is alert and oriented to person, place, and time.  Pedal pulses 2+ bilateral.  Skin: Skin is dry.  Bilateral feet are normal color with no cyanosis or color changes. Capillary refill is less than 3 seconds.    Psychiatric: She has a normal mood and affect. Her behavior is normal.          Assessment & Plan:  Diabetes Mellitus, controlled- A1C 6.5. Discussed with patient to stay on metformin daily. We have discussed tapering down his A1c continue to improve. It has actually gone up by .2 point. Continue with low carb, low sugar diet as well as staying active with exercise. I exam is up to date. Exam was normal with good sensation. Labs are up to date. We'll recheck in 6 months.  Raynaud syndrome-per patient Norvasc has helped with pain but now with cold sensation. She does feel like improving with warmer weather. Discuss circulation testing. Since pulses were normal as well as covering do not to light is negative. Continue on Norvasc 2.5 mg daily.

## 2014-04-25 ENCOUNTER — Telehealth: Payer: Self-pay | Admitting: Obstetrics & Gynecology

## 2014-04-25 NOTE — Telephone Encounter (Signed)
Left message to call back about rescheduled appointment

## 2014-04-27 ENCOUNTER — Ambulatory Visit: Payer: 59 | Admitting: Obstetrics & Gynecology

## 2014-05-02 ENCOUNTER — Ambulatory Visit (INDEPENDENT_AMBULATORY_CARE_PROVIDER_SITE_OTHER): Payer: 59 | Admitting: Physician Assistant

## 2014-05-02 ENCOUNTER — Encounter: Payer: Self-pay | Admitting: Physician Assistant

## 2014-05-02 VITALS — BP 130/75 | HR 79 | Ht 67.0 in | Wt 178.0 lb

## 2014-05-02 DIAGNOSIS — Z79899 Other long term (current) drug therapy: Secondary | ICD-10-CM

## 2014-05-02 DIAGNOSIS — H6990 Unspecified Eustachian tube disorder, unspecified ear: Secondary | ICD-10-CM | POA: Insufficient documentation

## 2014-05-02 DIAGNOSIS — E119 Type 2 diabetes mellitus without complications: Secondary | ICD-10-CM

## 2014-05-02 DIAGNOSIS — H6980 Other specified disorders of Eustachian tube, unspecified ear: Secondary | ICD-10-CM | POA: Insufficient documentation

## 2014-05-02 DIAGNOSIS — H9209 Otalgia, unspecified ear: Secondary | ICD-10-CM

## 2014-05-02 DIAGNOSIS — I7300 Raynaud's syndrome without gangrene: Secondary | ICD-10-CM

## 2014-05-02 DIAGNOSIS — H6981 Other specified disorders of Eustachian tube, right ear: Secondary | ICD-10-CM

## 2014-05-02 DIAGNOSIS — Z13220 Encounter for screening for lipoid disorders: Secondary | ICD-10-CM

## 2014-05-02 DIAGNOSIS — Z0189 Encounter for other specified special examinations: Secondary | ICD-10-CM

## 2014-05-02 DIAGNOSIS — Z23 Encounter for immunization: Secondary | ICD-10-CM

## 2014-05-02 DIAGNOSIS — Z0000 Encounter for general adult medical examination without abnormal findings: Secondary | ICD-10-CM

## 2014-05-02 DIAGNOSIS — E118 Type 2 diabetes mellitus with unspecified complications: Secondary | ICD-10-CM

## 2014-05-02 DIAGNOSIS — H9201 Otalgia, right ear: Secondary | ICD-10-CM

## 2014-05-02 LAB — POCT GLYCOSYLATED HEMOGLOBIN (HGB A1C): Hemoglobin A1C: 6.1

## 2014-05-02 MED ORDER — METHYLPREDNISOLONE (PAK) 4 MG PO TABS: ORAL_TABLET | ORAL | Status: DC

## 2014-05-02 MED ORDER — FLUTICASONE PROPIONATE 50 MCG/ACT NA SUSP: 2.0000 | Freq: Every day | NASAL | Status: DC

## 2014-05-02 MED ORDER — METFORMIN HCL 500 MG PO TABS: ORAL_TABLET | ORAL | Status: DC

## 2014-05-02 MED ORDER — AMLODIPINE BESYLATE 2.5 MG PO TABS: ORAL_TABLET | ORAL | Status: DC

## 2014-05-02 NOTE — Progress Notes (Signed)
   Subjective:    Patient ID: Ann Griffith, female    DOB: 09-29-1950, 63 y.o.   MRN: [id number]  HPI Pt presents to the clinic for 6 month DM follow up.   DM- doing well. Continues to walk 2 miles a day. She has lost 23lbs over last year. She does not regularly check sugars. She is on metformin 1000mg  bid. Denies any hypoglyemic events.   She has had on and off again right ear pain. Not tried anything. Sometimes will get sharp pain shoot through right ear after drinking a diet soda. No fever, chills, ST. Off and on sinus pressure. She has been having on and off dizziness.   She continues to have problems with coldness, pain and color changes of toes bilaterally. They have started to hurt more at night until she can't sleep until she takes ibuprofen pm. Taking norvasc daily. Toes never stay blue but do stay cold most of the time. Seems to be worse at night.   Review of Systems  All other systems reviewed and are negative.      Objective:   Physical Exam  Constitutional: She is oriented to person, place, and time. She appears well-developed and well-nourished.  HENT:  Head: Normocephalic and atraumatic.  Right Ear: External ear normal.  Left Ear: External ear normal.  Nose: Nose normal.  Mouth/Throat: Oropharynx is clear and moist. No oropharyngeal exudate.  Right ear seems to be slightly bulging due to fluid behind ear. No pain to palpation. No blood or pus.   Eyes: Conjunctivae are normal. Right eye exhibits no discharge. Left eye exhibits no discharge.  Neck: Normal range of motion. Neck supple.  Cardiovascular: Normal rate, regular rhythm and normal heart sounds.   Pulmonary/Chest: Effort normal and breath sounds normal. She has no wheezes.  Lymphadenopathy:    She has no cervical adenopathy.  Neurological: She is alert and oriented to person, place, and time.  Skin: Skin is dry.  Pedal pulse 2+ symmetric. Bilateral toes cold but not blue.   Psychiatric: She has a normal  mood and affect. Her behavior is normal.          Assessment & Plan:  DM- .[name] Lab Results  Component Value Date   HGBA1C 6.1 05/02/2014   Improved from 6 months ago. Discussed going down to metformin 500mg  1 po bid. Recheck in 6 months. Continue diet and exercise. Way to go on weight loss. Last eye exam 06/2013. Discussed with patient. Follow up in 6 months.   Flu shot given without complication today.   Raynaud syndrome- gave handout. Discussed most treatment is preventative. Decrease caffeine and keep toes warm and covered. Increase Norvasc to 5mg  daily. If no significant improvement follow up to discuss other treatment options. Red flag of toes staying blue discussed and to call office. Continue to take ibuprofen as needed for pain. Will check cmp.   Ordered fasting labs. Needs CPE.   Eustachian tube dysfunction right ear- flonase 2 sprays each nostril daily. If not improving in next week. Take medrol dose pak. Continue on maintence flonase. Follow up as needed.

## 2014-05-02 NOTE — Patient Instructions (Signed)
flonase 2 sprays each nostril for next 3-5 days.  If not improving start medrol dose pak for next couple of days.   Raynaud's Syndrome Raynaud's Syndrome is a disorder of the blood vessels in your hands and feet. It occurs when small arteries of the arms/hands or legs/feet become sensitive to cold or emotional upset. This causes the arteries to constrict, or narrow, and reduces blood flow to the area. The color in the fingers or toes changes from white to bluish to red and this is not usually painful. There may be numbness and tingling. Sores on the skin (ulcers) can form. Symptoms are usually relieved by warming. HOME CARE INSTRUCTIONS   Avoid exposure to cold. Keep your whole body warm and dry. Dress in layers. Wear mittens or gloves when handling ice or frozen food and when outdoors. Use holders for glasses or cans containing cold drinks. If possible, stay indoors during cold weather.  Limit your use of caffeine. Switch to decaffeinated coffee, tea, and soda pop. Avoid chocolate.  Avoid smoking or being around cigarette smoke. Smoke will make symptoms worse.  Wear loose fitting socks and comfortable, roomy shoes.  Avoid vibrating tools and machinery.  If possible, avoid stressful and emotional situations. Exercise, meditation and yoga may help you cope with stress. Biofeedback may be useful.  Ask your caregiver about medicine (calcium channel blockers) that may control Raynaud's phenomena. SEEK MEDICAL CARE IF:   Your discomfort becomes worse, despite conservative treatment.  You develop sores on your fingers and toes that do not heal. Document Released: 07/26/2000 Document Revised: 10/21/2011 Document Reviewed: 08/02/2008 [facility] Patient Information 2015 [place], [place]. This information is not intended to replace advice given to you by your health care provider. Make sure you discuss any questions you have with your health care provider.

## 2014-05-05 ENCOUNTER — Other Ambulatory Visit: Payer: Self-pay

## 2014-05-05 NOTE — Telephone Encounter (Signed)
Last AEX: 04/23/13 Last refill:04/23/13 #90 X 4 Current AEX:11/11/14  Please advise

## 2014-05-06 MED ORDER — ESTRADIOL-NORETHINDRONE ACET 1-0.5 MG PO TABS: 1.0000 | ORAL_TABLET | Freq: Every day | ORAL | Status: DC

## 2014-05-09 ENCOUNTER — Ambulatory Visit: Payer: 59 | Admitting: Obstetrics & Gynecology

## 2014-05-30 ENCOUNTER — Telehealth: Payer: Self-pay | Admitting: Obstetrics & Gynecology

## 2014-05-30 NOTE — Telephone Encounter (Signed)
lmtcb re: aex with Dr. [name] Heck 06/02/14

## 2014-06-13 ENCOUNTER — Encounter: Payer: Self-pay | Admitting: Physician Assistant

## 2014-06-16 ENCOUNTER — Ambulatory Visit (INDEPENDENT_AMBULATORY_CARE_PROVIDER_SITE_OTHER): Payer: 59 | Admitting: Obstetrics & Gynecology

## 2014-06-16 ENCOUNTER — Encounter: Payer: Self-pay | Admitting: Obstetrics & Gynecology

## 2014-06-16 VITALS — BP 130/66 | HR 80 | Resp 18 | Ht 67.0 in | Wt 180.0 lb

## 2014-06-16 DIAGNOSIS — Z124 Encounter for screening for malignant neoplasm of cervix: Secondary | ICD-10-CM

## 2014-06-16 DIAGNOSIS — Z01419 Encounter for gynecological examination (general) (routine) without abnormal findings: Secondary | ICD-10-CM

## 2014-06-16 DIAGNOSIS — Z0000 Encounter for general adult medical examination without abnormal findings: Secondary | ICD-10-CM

## 2014-06-16 LAB — POCT URINALYSIS DIPSTICK
Bilirubin, UA: NEGATIVE
GLUCOSE UA: NEGATIVE
KETONES UA: NEGATIVE
Nitrite, UA: NEGATIVE
Protein, UA: NEGATIVE
UROBILINOGEN UA: NEGATIVE
pH, UA: 5

## 2014-06-16 MED ORDER — ESTRADIOL-NORETHINDRONE ACET 1-0.5 MG PO TABS: 1.0000 | ORAL_TABLET | Freq: Every day | ORAL | Status: DC

## 2014-06-16 MED ORDER — ESTRADIOL 0.1 MG/GM VA CREA: TOPICAL_CREAM | VAGINAL | Status: DC

## 2014-06-16 NOTE — Progress Notes (Signed)
63 y.o. G1P1 MarriedCaucasianF here for annual exam.  Doing well.  hbA1C is 6.1.   Now on metformin 500mg  bid.    Sees dermatologist every two years now.  Has been seen this year.    Reports increased pain with intercourse, just with insertion.  Fine once finally gets past this point.  Patient's last menstrual period was 08/12/2000.          Sexually active: Yes.    The current method of family planning is post menopausal status.    Exercising: No.  The patient does not participate in regular exercise at present. Smoker:  no  Health Maintenance: Pap: 03/2012 Neg. HR HPV: neg History of abnormal Pap:  no MMG: 06/2013 BIRADS1: Neg. Has appt next month Colonoscopy: 06/2013, Dr. Michail Sermon Repeat 5 years  BMD:  01/2008 Normal, recommended doing this year TDaP: 2011 Screening Labs: PCP, Hb today: PCP, Urine today: RBC:Mod, WBC:Mod   reports that she has never smoked. She has never used smokeless tobacco. She reports that she drinks about 0.5 oz of alcohol per week. She reports that she does not use illicit drugs.  Past Medical History  Diagnosis Date  . DIABETES MELLITUS, TYPE II 11/09/2007    Qualifier: Diagnosis of  By: [name] Mcalpine    . CHOLELITHIASIS, HX OF 11/09/2007    Qualifier: Diagnosis of  By: [name] Mcalpine    . Hyperlipidemia   . Diverticulosis     Past Surgical History  Procedure Laterality Date  . Cholecystectomy    . Colonoscopy  08/2003  . Bone spur removed  2003    left heel    Current Outpatient Prescriptions  Medication Sig Dispense Refill  . AMBULATORY NON FORMULARY MEDICATION Test strips and lancets.  Testing once a day.   Dx: 250.00 100 strip 1  . amLODipine (NORVASC) 2.5 MG tablet TAKE 1-2 TABLET (2.5 MG TOTAL) BY MOUTH DAILY. 60 tablet 5  . aspirin 81 MG tablet Take 81 mg by mouth daily.    . cholecalciferol (VITAMIN D) 1000 UNITS tablet Take 1,000 Units by mouth 3 (three) times daily.     . Cinnamon 500 MG capsule Take 1,000 mg by mouth  daily.    [name] estradiol-norethindrone (ACTIVELLA) 1-0.5 MG per tablet Take 1 tablet by mouth daily. 90 tablet 1  . fish oil-omega-3 fatty acids 1000 MG capsule Take 1 g by mouth daily.    [name] ibuprofen (ADVIL,MOTRIN) 800 MG tablet Take 800 mg by mouth.    . metFORMIN (GLUCOPHAGE) 500 MG tablet Take 2 tabs twice a day with meals. 180 tablet 5  . Misc. Devices MISC One touch Ultra test strips 50/package    . PREVIDENT 5000 SENSITIVE 1.1-5 % PSTE   6  . Testosterone Propionate 2 % OINT Apply 1/4 tsp twice weekly (Patient taking differently: Apply once a month) 60 g 1  . clobetasol ointment (TEMOVATE) 3.42 % Apply 1 application topically 2 (two) times daily.   0  . fluticasone (FLONASE) 50 MCG/ACT nasal spray Place 2 sprays into both nostrils daily. 16 g 2   No current facility-administered medications for this visit.    Family History  Problem Relation Age of Onset  . Diabetes Mother     ROS:  Pertinent items are noted in HPI.  Otherwise, a comprehensive ROS was negative.  Exam:   BP 130/66 mmHg  Pulse 80  Resp 18  Ht 5\' 7"  (1.702 m)  Wt 180 lb (81.647 kg)  BMI 28.19 kg/m2  LMP 08/12/2000  Weight change: -18#  Height: 5\' 7"  (170.2 cm)  Ht Readings from Last 3 Encounters:  06/16/14 5\' 7"  (1.702 m)  05/02/14 5\' 7"  (1.702 m)  11/05/13 5\' 7"  (1.702 m)   General appearance: alert, cooperative and appears stated age Head: Normocephalic, without obvious abnormality, atraumatic Neck: no adenopathy, supple, symmetrical, trachea midline and thyroid normal to inspection and palpation Lungs: clear to auscultation bilaterally Breasts: normal appearance, no masses or tenderness Heart: regular rate and rhythm Abdomen: soft, non-tender; bowel sounds normal; no masses,  no organomegaly Extremities: extremities normal, atraumatic, no cyanosis or edema Skin: Skin color, texture, turgor normal. No rashes or lesions Lymph nodes: Cervical, supraclavicular, and axillary nodes normal. No abnormal  inguinal nodes palpated Neurologic: Grossly normal   Pelvic: External genitalia:  no lesions              Urethra:  normal appearing urethra with no masses, tenderness or lesions              Bartholins and Skenes: normal                 Vagina: normal appearing vagina with normal color and discharge, no lesions              Cervix: no lesions              Pap taken: Yes.   Bimanual Exam:  Uterus:  normal size, contour, position, consistency, mobility, non-tender              Adnexa: normal adnexa and no mass, fullness, tenderness               Rectovaginal: Confirms               Anus:  normal sphincter tone, no lesions  A:  Well Woman with normal exam PMP on HRT DM, on metformin.  Being seen every six months now. Sun exposure--sees derm yearly Decreased libido Vaginal atrophy  P: Mammogram yearly pap smear with neg HR HPV 8/13. Pap today Continue HRT 1.0/0.5mg  daily #90/4RF.  Will decrease to 1/2 tab daily starting in January and see if she can wean down her dosage. Topical estrace cream, small amount externally twice weekly Topical testosterone 2% ointment twice weekly. #60 grams/1 RF.  Written rx to pt. Labs now with PCP. F/U yearly or prn  An After Visit Summary was printed and given to the patient.

## 2014-06-17 LAB — IPS PAP TEST WITH REFLEX TO HPV

## 2014-07-25 LAB — HM DIABETES EYE EXAM

## 2014-08-16 ENCOUNTER — Encounter: Payer: Self-pay | Admitting: Physician Assistant

## 2014-08-31 ENCOUNTER — Telehealth: Payer: Self-pay | Admitting: Obstetrics & Gynecology

## 2014-08-31 NOTE — Telephone Encounter (Signed)
Pharmacy calling requesting prescription information. "How much testosterone cream to apply daily". Last seen 06/16/14.

## 2014-08-31 NOTE — Telephone Encounter (Signed)
[place] and s/w Pamala Hurry clarified that patient is to apply 1/4 tsp twice weekly.  Routed to provider for review, encounter closed.

## 2014-11-02 ENCOUNTER — Encounter: Payer: Self-pay | Admitting: Physician Assistant

## 2014-11-02 ENCOUNTER — Ambulatory Visit (INDEPENDENT_AMBULATORY_CARE_PROVIDER_SITE_OTHER): Payer: Medicare HMO | Admitting: Physician Assistant

## 2014-11-02 ENCOUNTER — Other Ambulatory Visit: Payer: Self-pay | Admitting: Obstetrics & Gynecology

## 2014-11-02 VITALS — BP 132/84 | HR 85 | Ht 67.0 in

## 2014-11-02 DIAGNOSIS — Z13220 Encounter for screening for lipoid disorders: Secondary | ICD-10-CM | POA: Diagnosis not present

## 2014-11-02 DIAGNOSIS — E118 Type 2 diabetes mellitus with unspecified complications: Secondary | ICD-10-CM

## 2014-11-02 DIAGNOSIS — R809 Proteinuria, unspecified: Secondary | ICD-10-CM

## 2014-11-02 DIAGNOSIS — Z79899 Other long term (current) drug therapy: Secondary | ICD-10-CM | POA: Diagnosis not present

## 2014-11-02 DIAGNOSIS — H8111 Benign paroxysmal vertigo, right ear: Secondary | ICD-10-CM | POA: Insufficient documentation

## 2014-11-02 DIAGNOSIS — H8113 Benign paroxysmal vertigo, bilateral: Secondary | ICD-10-CM

## 2014-11-02 LAB — COMPLETE METABOLIC PANEL WITH GFR
ALT: 15 U/L (ref 0–35)
AST: 15 U/L (ref 0–37)
Albumin: 4.4 g/dL (ref 3.5–5.2)
Alkaline Phosphatase: 33 U/L — ABNORMAL LOW (ref 39–117)
BILIRUBIN TOTAL: 0.5 mg/dL (ref 0.2–1.2)
BUN: 12 mg/dL (ref 6–23)
CHLORIDE: 103 meq/L (ref 96–112)
CO2: 24 meq/L (ref 19–32)
Calcium: 9.7 mg/dL (ref 8.4–10.5)
Creat: 0.96 mg/dL (ref 0.50–1.10)
GFR, Est African American: 72 mL/min
GFR, Est Non African American: 63 mL/min
GLUCOSE: 128 mg/dL — AB (ref 70–99)
Potassium: 4.3 mEq/L (ref 3.5–5.3)
Sodium: 138 mEq/L (ref 135–145)
Total Protein: 7.3 g/dL (ref 6.0–8.3)

## 2014-11-02 LAB — LIPID PANEL
CHOL/HDL RATIO: 3.8 ratio
Cholesterol: 253 mg/dL — ABNORMAL HIGH (ref 0–200)
HDL: 67 mg/dL (ref >=46)
LDL Cholesterol: 170 mg/dL — ABNORMAL HIGH (ref 0–99)
TRIGLYCERIDES: 82 mg/dL (ref <150)
VLDL: 16 mg/dL (ref 0–40)

## 2014-11-02 LAB — POCT UA - MICROALBUMIN
Creatinine, POC: 50 mg/dL
MICROALBUMIN (UR) POC: 10 mg/L

## 2014-11-02 LAB — POCT GLYCOSYLATED HEMOGLOBIN (HGB A1C): Hemoglobin A1C: 6.4

## 2014-11-02 LAB — TSH: TSH: 2.578 u[IU]/mL (ref 0.350–4.500)

## 2014-11-02 MED ORDER — LISINOPRIL 2.5 MG PO TABS: 2.5000 mg | ORAL_TABLET | Freq: Every day | ORAL | Status: DC

## 2014-11-02 MED ORDER — METFORMIN HCL 500 MG PO TABS: ORAL_TABLET | ORAL | Status: DC

## 2014-11-02 NOTE — Telephone Encounter (Signed)
Medication refill request: Mimvey. Pt is on Activella  Last AEX: 06/16/14 SM Next AEX: 08/24/15 SM Last MMG (if hormonal medication request): 06/28/13 BIRADS1:Neg Refill authorized: 06/16/14 #90tabs/4Refills to [facility]

## 2014-11-02 NOTE — Progress Notes (Signed)
   Subjective:    Patient ID: Ann Griffith, female    DOB: 02-27-1951, 64 y.o.   MRN: [id number]  HPI Patient is a 64 year old female who presents to the clinic to follow-up on her well-controlled diabetes. She has no complaints or concerns today. She is not regularly checking her sugars. She denies any hypoglycemic events. She is only taking metformin 500 mg 1 tab twice a day. She is trying to stay active and watching her carbs and sugar intake.  Raynauds syndrome-patient does need refill on Norvasc. Seems to be helping significantly.  BPV- episodes of vertigo. None today. Off and on.    Review of Systems  All other systems reviewed and are negative.      Objective:   Physical Exam  Constitutional: She is oriented to person, place, and time. She appears well-developed and well-nourished.  HENT:  Head: Normocephalic and atraumatic.  Cardiovascular: Normal rate, regular rhythm and normal heart sounds.   Pulmonary/Chest: Effort normal and breath sounds normal. She has no wheezes.  Neurological: She is alert and oriented to person, place, and time.  Skin: Skin is dry.  Psychiatric: She has a normal mood and affect. Her behavior is normal.          Assessment & Plan:  DM- .[name] Lab Results  Component Value Date   HGBA1C 6.4 11/02/2014   mircoalbumin elevated. Added lisinopril today. BG controlled. BP borderline for diabetic. We'll continue to monitor. Continue metformin 500 mg 1 tab twice daily. Patient did not get her lipid level drawn at last visit. Reordered today along with cmp, TSH, and vitamin D.   raynauds syndrome- refilled Norvasc today.  BPV- gave copy of epley manuevers to use as needed. If allergy related consider flonase.

## 2014-11-03 ENCOUNTER — Other Ambulatory Visit: Payer: Self-pay | Admitting: Physician Assistant

## 2014-11-03 ENCOUNTER — Encounter: Payer: Self-pay | Admitting: Physician Assistant

## 2014-11-03 DIAGNOSIS — E785 Hyperlipidemia, unspecified: Secondary | ICD-10-CM | POA: Insufficient documentation

## 2014-11-03 LAB — VITAMIN D 25 HYDROXY (VIT D DEFICIENCY, FRACTURES): Vit D, 25-Hydroxy: 29 ng/mL — ABNORMAL LOW (ref 30–100)

## 2014-11-07 ENCOUNTER — Other Ambulatory Visit: Payer: Self-pay | Admitting: Physician Assistant

## 2014-11-07 MED ORDER — ATORVASTATIN CALCIUM 80 MG PO TABS: 80.0000 mg | ORAL_TABLET | Freq: Every day | ORAL | Status: DC

## 2014-11-11 ENCOUNTER — Ambulatory Visit: Payer: 59 | Admitting: Obstetrics & Gynecology

## 2014-11-16 ENCOUNTER — Telehealth: Payer: Self-pay | Admitting: Obstetrics & Gynecology

## 2014-11-16 NOTE — Telephone Encounter (Signed)
Patient wants to talk with the nurse she is available after 1pm today . No information given

## 2014-11-16 NOTE — Telephone Encounter (Signed)
Cut in 1/2 for one month and then take every other day for about a month and then every third day until they are gone.  If symptoms return and are significant, she will need to call back.  Thanks.

## 2014-11-16 NOTE — Telephone Encounter (Signed)
Spoke with patient and given instructions from Dr. [name] Heck patient feels she has enough tablets to last her through April, advised to cut in half and continue as directed until out of medication. Patient advised to call back if any problems. Patient verbalized understanding.   Routing to provider for final review. Patient agreeable to disposition. Will close encounter

## 2014-11-16 NOTE — Telephone Encounter (Signed)
Spoke with patient. She is on Mimvey 1-0.5 mg. She states that price has increased from $10.00 per month to greater than $100.00. She is asking if Dr. [name] Heck would be okay if she DC use. Advised patient that it is likely Dr. [name] Heck will be okay with DC but will obtain instructions on how to best wean off. Patient states she has enough pills to last her for one month. Advised would return call with instructions. Patient agreeable.

## 2014-12-02 ENCOUNTER — Ambulatory Visit (INDEPENDENT_AMBULATORY_CARE_PROVIDER_SITE_OTHER): Payer: Medicare HMO | Admitting: Physician Assistant

## 2014-12-02 ENCOUNTER — Encounter: Payer: Self-pay | Admitting: Physician Assistant

## 2014-12-02 VITALS — BP 116/75 | HR 84 | Ht 67.0 in | Wt 179.0 lb

## 2014-12-02 DIAGNOSIS — M546 Pain in thoracic spine: Secondary | ICD-10-CM | POA: Diagnosis not present

## 2014-12-02 DIAGNOSIS — M549 Dorsalgia, unspecified: Secondary | ICD-10-CM

## 2014-12-02 MED ORDER — CYCLOBENZAPRINE HCL 10 MG PO TABS: 10.0000 mg | ORAL_TABLET | Freq: Three times a day (TID) | ORAL | Status: DC | PRN

## 2014-12-02 MED ORDER — IBUPROFEN 800 MG PO TABS: 800.0000 mg | ORAL_TABLET | Freq: Three times a day (TID) | ORAL | Status: DC | PRN

## 2014-12-02 NOTE — Progress Notes (Signed)
   Subjective:    Patient ID: Ann Griffith, female    DOB: 1951-01-04, 64 y.o.   MRN: [id number]  HPI  Pt presents to the clinic with on and off right upper back pain that sometimes will radiate numbness into right arm. Most of time resolves with time and rest. 3 days ago making pizza and rolling out dough and started to feel discomfort. Burning and discomfort just would not go away. No injury. No neck pain.  1 week ago arm and hand 1 week pizza. Called [facility]eq[facility]. Tried to use some heat and did help some. Seems a little better today.    Review of Systems  All other systems reviewed and are negative.      Objective:   Physical Exam  Constitutional: She is oriented to person, place, and time. She appears well-developed and well-nourished.  HENT:  Head: Normocephalic and atraumatic.  Musculoskeletal:  Tenderness over right supraspinatus and infraspinatues.  No cspine tenderness.  Full ROM without pain of right shoulder.  No bony tenderness at the shoulder.  Negative hawkins and empty can.  Hand grip 5/5.   Neurological: She is alert and oriented to person, place, and time.  Skin: Skin is dry.  Psychiatric: She has a normal mood and affect. Her behavior is normal.          Assessment & Plan:  Upper right back pain- I do think there is some spasms and possible impingement of the supraspinatus and infraspinatus. Discussed Ice. Given specific exercise. ibupropfen 800mg  given. Flexeril also given. Sedation warning. Follow up in worsening or no improvement in 1 week.

## 2014-12-21 ENCOUNTER — Telehealth: Payer: Self-pay | Admitting: Obstetrics & Gynecology

## 2014-12-21 NOTE — Telephone Encounter (Signed)
Spoke with patient. Patient states that she has reduced her Mimvey 1-0.5mg   to taking 1/2 pill every three days to wean off. "I am having a lot of hot flashes and only have 11 pills left. I am trying to come off of it because it is so expensive. Is there anything that I could take that would be less expensive? I also want to ask her if I can continue using my testosterone cream?" Advised patient will speak with Dr.Miller regarding medication alternatives and continuation and return call with further recommendations. Patient is agreeable.

## 2014-12-21 NOTE — Telephone Encounter (Signed)
Left message to call [name] at [phone]. 

## 2014-12-21 NOTE — Telephone Encounter (Signed)
Patient calling with questions about her hormones and hot flashes.

## 2014-12-21 NOTE — Telephone Encounter (Signed)
Returning a call to [name]. °

## 2014-12-22 NOTE — Telephone Encounter (Signed)
She could switch to estradiol 0.5mg  tabs with provera 2.5mg  tabs.  She will need to take two but this will cost a lot less.  Ok to continue using the testosterone cream as well.

## 2014-12-22 NOTE — Telephone Encounter (Signed)
Left message to call Girtrude Enslin at [phone]. 

## 2014-12-22 NOTE — Telephone Encounter (Signed)
Returning  A call to [facility]

## 2014-12-22 NOTE — Telephone Encounter (Signed)
Spoke with patient. Advised of message as seen below from [place]. Patient is agreeable and verbalizes understanding. Patient would like to check with her insurance company regarding medication coverage and return call.

## 2015-02-07 NOTE — Telephone Encounter (Signed)
Patient to check with insurance company regarding change of medication and return call if change is desire. Routing to provider as Juluis Rainier. Will close encounter.

## 2015-02-07 NOTE — Telephone Encounter (Signed)
Has this been resolved or is follow up needed?

## 2015-02-10 ENCOUNTER — Other Ambulatory Visit: Payer: Self-pay | Admitting: Physician Assistant

## 2015-03-14 ENCOUNTER — Encounter: Payer: Self-pay | Admitting: Family Medicine

## 2015-03-14 ENCOUNTER — Ambulatory Visit (INDEPENDENT_AMBULATORY_CARE_PROVIDER_SITE_OTHER): Payer: Medicare HMO | Admitting: Family Medicine

## 2015-03-14 VITALS — BP 119/74 | HR 77 | Ht 67.0 in | Wt 178.0 lb

## 2015-03-14 DIAGNOSIS — L219 Seborrheic dermatitis, unspecified: Secondary | ICD-10-CM | POA: Diagnosis not present

## 2015-03-14 MED ORDER — KETOCONAZOLE 2 % EX CREA: 1.0000 "application " | TOPICAL_CREAM | Freq: Every day | CUTANEOUS | Status: DC

## 2015-03-14 NOTE — Progress Notes (Signed)
Ann Griffith is a 64 y.o. female who presents to [place]  today for rash. Patient has a mild dry patchy macular rash on the left nasal labile fold. This is been present for a few weeks. She has not tried any treatment. It is mildly itchy. No new cosmetics or medicines. No fevers chills nausea vomiting or diarrhea.   Past Medical History  Diagnosis Date  . DIABETES MELLITUS, TYPE II 11/09/2007    Qualifier: Diagnosis of  By: [name] Mcalpine    . CHOLELITHIASIS, HX OF 11/09/2007    Qualifier: Diagnosis of  By: [name] Mcalpine    . Hyperlipidemia   . Diverticulosis    Past Surgical History  Procedure Laterality Date  . Cholecystectomy    . Colonoscopy  08/2003  . Bone spur removed  2003    left heel   History  Substance Use Topics  . Smoking status: Never Smoker   . Smokeless tobacco: Never Used  . Alcohol Use: 0.5 oz/week    1 drink(s) per week   ROS as above Medications: Current Outpatient Prescriptions  Medication Sig Dispense Refill  . AMBULATORY NON FORMULARY MEDICATION Test strips and lancets.  Testing once a day.   Dx: 250.00 100 strip 1  . amLODipine (NORVASC) 2.5 MG tablet TAKE 1-2 TABLET (2.5 MG TOTAL) BY MOUTH DAILY. 60 tablet 5  . aspirin 81 MG tablet Take 81 mg by mouth daily.    [name] atorvastatin (LIPITOR) 80 MG tablet Take 1 tablet (80 mg total) by mouth daily. 90 tablet 4  . cholecalciferol (VITAMIN D) 1000 UNITS tablet Take 1,000 Units by mouth 3 (three) times daily.     . Cinnamon 500 MG capsule Take 1,000 mg by mouth daily.    . clobetasol ointment (TEMOVATE) 1.74 % Apply 1 application topically as needed.   0  . cyclobenzaprine (FLEXERIL) 10 MG tablet Take 1 tablet (10 mg total) by mouth 3 (three) times daily as needed for muscle spasms. 60 tablet 0  . estradiol (ESTRACE) 0.1 MG/GM vaginal cream Use small amount topically twice weekly 42.5 g 1  . estradiol-norethindrone (ACTIVELLA) 1-0.5 MG per tablet Take 1  tablet by mouth daily.    . fish oil-omega-3 fatty acids 1000 MG capsule Take 1 g by mouth daily.    [name] ibuprofen (ADVIL,MOTRIN) 800 MG tablet Take 1 tablet (800 mg total) by mouth every 8 (eight) hours as needed. 60 tablet 1  . lisinopril (PRINIVIL,ZESTRIL) 2.5 MG tablet Take 1 tablet (2.5 mg total) by mouth daily. 30 tablet 5  . metFORMIN (GLUCOPHAGE) 500 MG tablet Take 2 tabs twice a day with meals. 180 tablet 5  . Misc. Devices MISC One touch Ultra test strips 50/package    . PREVIDENT 5000 SENSITIVE 1.1-5 % PSTE   6  . Testosterone Propionate 2 % OINT Apply 1/4 tsp twice weekly (Patient taking differently: Apply once a month) 60 g 1  . ketoconazole (NIZORAL) 2 % cream Apply 1 application topically daily. 30 g 1   No current facility-administered medications for this visit.   Allergies  Allergen Reactions  . Amoxicillin Swelling     Exam:  BP 119/74 mmHg  Pulse 77  Ht 5\' 7"  (1.702 m)  Wt 178 lb (80.74 kg)  BMI 27.87 kg/m2  LMP 08/12/2000 Gen: Well NAD HEENT: EOMI,  MMM Lungs: Normal work of breathing. CTABL Heart: RRR no MRG Abd: NABS, Soft. Nondistended, Nontender Exts: Brisk capillary refill, warm and well  perfused.  Skin: Mildly erythematous scaly rash left nasolabial fold. Nontender.  No results found for this or any previous visit (from the past 24 hour(s)). No results found.   Please see individual assessment and plan sections.

## 2015-03-14 NOTE — Assessment & Plan Note (Signed)
Treated with ketoconazole and hydrocortisone cream.

## 2015-03-14 NOTE — Patient Instructions (Signed)
Thank you for coming in today. Use the ketoconazole cream and OTC hydrocortisone cream as needed.   Seborrheic Dermatitis Seborrheic dermatitis involves pink or red skin with greasy, flaky scales. This is often found on the scalp, eyebrows, nose, bearded area, and on or behind the ears. It can also occur on the central chest. It often occurs where there are more oil (sebaceous) glands. This condition is also known as dandruff. When this condition affects a baby's scalp, it is called cradle cap. It may come and go for no known reason. It can occur at any time of life from infancy to old age. CAUSES  The cause is unknown. It is not the result of too little moisture or too much oil. In some people, seborrheic dermatitis flare-ups seem to be triggered by stress. It also commonly occurs in people with certain diseases such as Parkinson's disease or HIV/AIDS. SYMPTOMS   Thick scales on the scalp.  Redness on the face or in the armpits.  The skin may seem oily or dry, but moisturizers do not help.  In infants, seborrheic dermatitis appears as scaly redness that does not seem to bother the baby. In some babies, it affects only the scalp. In others, it also affects the neck creases, armpits, groin, or behind the ears.  In adults and adolescents, seborrheic dermatitis may affect only the scalp. It may look patchy or spread out, with areas of redness and flaking. Other areas commonly affected include:  Eyebrows.  Eyelids.  Forehead.  Skin behind the ears.  Outer ears.  Chest.  Armpits.  Nose creases.  Skin creases under the breasts.  Skin between the buttocks.  Groin.  Some adults and adolescents feel itching or burning in the affected areas. DIAGNOSIS  Your caregiver can usually tell what the problem is by doing a physical exam. TREATMENT   Cortisone (steroid) ointments, creams, and lotions can help decrease inflammation.  Babies can be treated with baby oil to soften the  scales, then they may be washed with baby shampoo. If this does not help, a prescription topical steroid medicine may work.  Adults can use medicated shampoos.  Your caregiver may prescribe corticosteroid cream and shampoo containing an antifungal or yeast medicine (ketoconazole). Hydrocortisone or anti-yeast cream can be rubbed directly onto seborrheic dermatitis patches. Yeast does not cause seborrheic dermatitis, but it seems to add to the problem. In infants, seborrheic dermatitis is often worst during the first year of life. It tends to disappear on its own as the child grows. However, it may return during the teenage years. In adults and adolescents, seborrheic dermatitis tends to be a long-lasting condition that comes and goes over many years. HOME CARE INSTRUCTIONS   Use prescribed medicines as directed.  In infants, do not aggressively remove the scales or flakes on the scalp with a comb or by other means. This may lead to hair loss. SEEK MEDICAL CARE IF:   The problem does not improve from the medicated shampoos, lotions, or other medicines given by your caregiver.  You have any other questions or concerns. Document Released: 07/29/2005 Document Revised: 01/28/2012 Document Reviewed: 12/18/2009 [facility] Patient Information 2015 [place], [place]. This information is not intended to replace advice given to you by your health care provider. Make sure you discuss any questions you have with your health care provider.

## 2015-05-05 ENCOUNTER — Ambulatory Visit (INDEPENDENT_AMBULATORY_CARE_PROVIDER_SITE_OTHER): Payer: Medicare HMO | Admitting: Physician Assistant

## 2015-05-05 ENCOUNTER — Encounter: Payer: Self-pay | Admitting: Physician Assistant

## 2015-05-05 VITALS — BP 125/67 | HR 67 | Ht 67.0 in | Wt 176.0 lb

## 2015-05-05 DIAGNOSIS — E118 Type 2 diabetes mellitus with unspecified complications: Secondary | ICD-10-CM | POA: Diagnosis not present

## 2015-05-05 DIAGNOSIS — R413 Other amnesia: Secondary | ICD-10-CM

## 2015-05-05 DIAGNOSIS — R194 Change in bowel habit: Secondary | ICD-10-CM

## 2015-05-05 DIAGNOSIS — Z23 Encounter for immunization: Secondary | ICD-10-CM | POA: Diagnosis not present

## 2015-05-05 DIAGNOSIS — E785 Hyperlipidemia, unspecified: Secondary | ICD-10-CM

## 2015-05-05 LAB — POCT GLYCOSYLATED HEMOGLOBIN (HGB A1C): Hemoglobin A1C: 6.6

## 2015-05-05 MED ORDER — LISINOPRIL 2.5 MG PO TABS: 2.5000 mg | ORAL_TABLET | Freq: Every day | ORAL | Status: DC

## 2015-05-05 MED ORDER — SIMVASTATIN 40 MG PO TABS: 40.0000 mg | ORAL_TABLET | Freq: Every day | ORAL | Status: DC

## 2015-05-05 NOTE — Addendum Note (Signed)
Addended by: Beatris Ship L on: 05/05/2015 10:16 AM   Modules accepted: Orders

## 2015-05-05 NOTE — Patient Instructions (Signed)
Align/probiotic IBS diet.  Irritable Bowel Syndrome Irritable bowel syndrome (IBS) is caused by a disturbance of normal bowel function and is a common digestive disorder. You may also hear this condition called spastic colon, mucous colitis, and irritable colon. There is no cure for IBS. However, symptoms often gradually improve or disappear with a good diet, stress management, and medicine. This condition usually appears in late adolescence or early adulthood. Women develop it twice as often as men. CAUSES  After food has been digested and absorbed in the small intestine, waste material is moved into the large intestine, or colon. In the colon, water and salts are absorbed from the undigested products coming from the small intestine. The remaining residue, or fecal material, is held for elimination. Under normal circumstances, gentle, rhythmic contractions of the bowel walls push the fecal material along the colon toward the rectum. In IBS, however, these contractions are irregular and poorly coordinated. The fecal material is either retained too long, resulting in constipation, or expelled too soon, producing diarrhea. SIGNS AND SYMPTOMS  The most common symptom of IBS is abdominal pain. It is often in the lower left side of the abdomen, but it may occur anywhere in the abdomen. The pain comes from spasms of the bowel muscles happening too much and from the buildup of gas and fecal material in the colon. This pain:  Can range from sharp abdominal cramps to a dull, continuous ache.  Often worsens soon after eating.  Is often relieved by having a bowel movement or passing gas. Abdominal pain is usually accompanied by constipation, but it may also produce diarrhea. The diarrhea often occurs right after a meal or upon waking up in the morning. The stools are often soft, watery, and flecked with mucus. Other symptoms of IBS include:  Bloating.  Loss of appetite.  Heartburn.  Backache.  Dull  pain in the arms or shoulders.  Nausea.  Burping.  Vomiting.  Gas. IBS may also cause symptoms that are unrelated to the digestive system, such as:  Fatigue.  Headaches.  Anxiety.  Shortness of breath.  Trouble concentrating.  Dizziness. These symptoms tend to come and go. DIAGNOSIS  The symptoms of IBS may seem like symptoms of other, more serious digestive disorders. Your health care provider may want to perform tests to exclude these disorders.  TREATMENT Many medicines are available to help correct bowel function or relieve bowel spasms and abdominal pain. Among the medicines available are:  Laxatives for severe constipation and to help restore normal bowel habits.  Specific antidiarrheal medicines to treat severe or lasting diarrhea.  Antispasmodic agents to relieve intestinal cramps. Your health care provider may also decide to treat you with a mild tranquilizer or sedative during unusually stressful periods in your life. Your health care provider may also prescribe antidepressant medicine. The use of this medicine has been shown to reduce pain and other symptoms of IBS. Remember that if any medicine is prescribed for you, you should take it exactly as directed. Make sure your health care provider knows how well it worked for you. HOME CARE INSTRUCTIONS   Take all medicines as directed by your health care provider.  Avoid foods that are high in fat or oils, such as heavy cream, butter, frankfurters, sausage, and other fatty meats.  Avoid foods that make you go to the bathroom, such as fruit, fruit juice, and dairy products.  Cut out carbonated drinks, chewing gum, and "gassy" foods such as beans and cabbage. This may help  relieve bloating and burping.  Eat foods with bran, and drink plenty of liquids with the bran foods. This helps relieve constipation.  Keep track of what foods seem to bring on your symptoms.  Avoid emotionally charged situations or  circumstances that produce anxiety.  Start or continue exercising.  Get plenty of rest and sleep. Document Released: 07/29/2005 Document Revised: 08/03/2013 Document Reviewed: 03/18/2008 [facility] Patient Information 2015 [place], [place]. This information is not intended to replace advice given to you by your health care provider. Make sure you discuss any questions you have with your health care provider.

## 2015-05-05 NOTE — Progress Notes (Addendum)
Ann Griffith is a 64 y.o. female who presents to [facility]  today for follow up for diabetes. She reports she has discontinued her atorvastatin due to memory impairment, she feels that her memory/congition has returned to full function since discontinuing atorvastatin. She would like to try simvastatin instead because her husband takes it without any side effects. She has also discontinued her pm dose of 1000 mg metformin, but is still taking her am dose. She reports eating a healthy diet and exercising regularly with her husband at the [facility]. She also voices concerns of alternating diarrhea and constipation. She denies any medication or diet changes. She denies any N/V or abdominal pain. Not tried anything to make better or worse. No blood in stool.    Past Medical History  Diagnosis Date  . DIABETES MELLITUS, TYPE II 11/09/2007    Qualifier: Diagnosis of  By: [name] Mcalpine    . CHOLELITHIASIS, HX OF 11/09/2007    Qualifier: Diagnosis of  By: [name] Mcalpine    . Hyperlipidemia   . Diverticulosis    Past Surgical History  Procedure Laterality Date  . Cholecystectomy    . Colonoscopy  08/2003  . Bone spur removed  2003    left heel   Social History  Substance Use Topics  . Smoking status: Never Smoker   . Smokeless tobacco: Never Used  . Alcohol Use: 0.5 oz/week    1 drink(s) per week   family history includes Diabetes in her mother.  ROS as above Medications: Current Outpatient Prescriptions  Medication Sig Dispense Refill  . AMBULATORY NON FORMULARY MEDICATION Test strips and lancets.  Testing once a day.   Dx: 250.00 100 strip 1  . amLODipine (NORVASC) 2.5 MG tablet TAKE 1-2 TABLET (2.5 MG TOTAL) BY MOUTH DAILY. 60 tablet 5  . aspirin 81 MG tablet Take 81 mg by mouth daily.    . cholecalciferol (VITAMIN D) 1000 UNITS tablet Take 1,000 Units by mouth 3 (three) times daily.     . Cinnamon 500 MG capsule Take 1,000 mg by mouth  daily.    . clobetasol ointment (TEMOVATE) 8.67 % Apply 1 application topically as needed.   0  . cyclobenzaprine (FLEXERIL) 10 MG tablet Take 1 tablet (10 mg total) by mouth 3 (three) times daily as needed for muscle spasms. 60 tablet 0  . estradiol (ESTRACE) 0.1 MG/GM vaginal cream Use small amount topically twice weekly 42.5 g 1  . estradiol-norethindrone (ACTIVELLA) 1-0.5 MG per tablet Take 1 tablet by mouth daily.    . fish oil-omega-3 fatty acids 1000 MG capsule Take 1 g by mouth daily.    [name] ibuprofen (ADVIL,MOTRIN) 800 MG tablet Take 1 tablet (800 mg total) by mouth every 8 (eight) hours as needed. 60 tablet 1  . ketoconazole (NIZORAL) 2 % cream Apply 1 application topically daily. 30 g 1  . lisinopril (PRINIVIL,ZESTRIL) 2.5 MG tablet Take 1 tablet (2.5 mg total) by mouth daily. 30 tablet 5  . metFORMIN (GLUCOPHAGE) 500 MG tablet Take 2 tabs twice a day with meals. 180 tablet 5  . Misc. Devices MISC One touch Ultra test strips 50/package    . PREVIDENT 5000 SENSITIVE 1.1-5 % PSTE   6  . Testosterone Propionate 2 % OINT Apply 1/4 tsp twice weekly (Patient taking differently: Apply once a month) 60 g 1   No current facility-administered medications for this visit.   Allergies  Allergen Reactions  . Amoxicillin Swelling  .  Atorvastatin Other (See Comments)    Memory loss     Exam:  BP 125/67 mmHg  Pulse 67  Ht 5\' 7"  (1.702 m)  Wt 176 lb (79.833 kg)  BMI 27.56 kg/m2  LMP 08/12/2000 Gen: Well NAD, alert and oriented to time, person, and place HEENT: EOMI,  MMM Lungs: Normal work of breathing. CTABL Heart: RRR no MRG Abd: NABS, Soft. Nondistended, Nontender Exts: Brisk capillary refill, warm and well perfused.   Results for orders placed or performed in visit on 05/05/15 (from the past 24 hour(s))  POCT HgB A1C     Status: None   Collection Time: 05/05/15  8:54 AM  Result Value Ref Range   Hemoglobin A1C 6.6    No results found.   6CIT: 2- normal   Assessment and  Plan:  DM- Continue am 1000 mg dose of metformin and restart pm 1000 mg dose of metformin. Continue to improve diet by eliminating sweets, and continue exercise regimen. .. Lab Results  Component Value Date   HGBA1C 6.6 05/05/2015   A!C looks good. Increased a little from 3 months ago.  Eye exam up to date.  Flu and prevnar given today.   Hyperlipidemia- Allow patient to try 40 mg simvastatin. Discontinue immediately if memory/congition symptoms return.    Memory Changes- Patient feels that atorvastatin caused changes in memory/cognition, but deficits resolved completely after discontinuing atorvastatin. 6CIT score today was 2, and no deficits were observed today. I'm not convinced atorvastatin caused a true cognition deficit. Will monitor cognition on simvastatin and discontinue if symptoms return.   Bowel habit changes- Encouraged patient to keep log of foods eaten and associated symptoms. Encouraged patient to increase daily intake of fiber/water, as well as try a probiotic. Return if symptoms do not improve with diet changes. Certainly seems like could be start of IBS symptoms. Keep food diary.   Well adult screening- Administered prevnar 13 vaccine in office today   Reviewed and changes made by Iran Planas PA-C

## 2015-05-05 NOTE — Progress Notes (Deleted)
   Subjective:    Patient ID: Ann Griffith, female    DOB: 23-Sep-1950, 64 y.o.   MRN: [id number]  HPI   Wants simvastatin  Review of Systems     Objective:   Physical Exam        Assessment & Plan:

## 2015-05-07 ENCOUNTER — Other Ambulatory Visit: Payer: Self-pay | Admitting: Physician Assistant

## 2015-08-04 ENCOUNTER — Encounter: Payer: Self-pay | Admitting: Physician Assistant

## 2015-08-04 ENCOUNTER — Ambulatory Visit (INDEPENDENT_AMBULATORY_CARE_PROVIDER_SITE_OTHER): Payer: Medicare HMO | Admitting: Physician Assistant

## 2015-08-04 VITALS — BP 130/62 | HR 74 | Ht 67.0 in | Wt 169.0 lb

## 2015-08-04 DIAGNOSIS — M7551 Bursitis of right shoulder: Secondary | ICD-10-CM

## 2015-08-04 DIAGNOSIS — Z114 Encounter for screening for human immunodeficiency virus [HIV]: Secondary | ICD-10-CM

## 2015-08-04 DIAGNOSIS — Z79899 Other long term (current) drug therapy: Secondary | ICD-10-CM | POA: Diagnosis not present

## 2015-08-04 DIAGNOSIS — M79604 Pain in right leg: Secondary | ICD-10-CM

## 2015-08-04 DIAGNOSIS — E785 Hyperlipidemia, unspecified: Secondary | ICD-10-CM | POA: Diagnosis not present

## 2015-08-04 DIAGNOSIS — G4700 Insomnia, unspecified: Secondary | ICD-10-CM

## 2015-08-04 DIAGNOSIS — E118 Type 2 diabetes mellitus with unspecified complications: Secondary | ICD-10-CM

## 2015-08-04 DIAGNOSIS — Z1159 Encounter for screening for other viral diseases: Secondary | ICD-10-CM

## 2015-08-04 LAB — POCT GLYCOSYLATED HEMOGLOBIN (HGB A1C): Hemoglobin A1C: 6.2

## 2015-08-04 MED ORDER — IBUPROFEN 800 MG PO TABS: 800.0000 mg | ORAL_TABLET | Freq: Three times a day (TID) | ORAL | Status: DC | PRN

## 2015-08-04 NOTE — Patient Instructions (Addendum)
Magnesium 500mg  daily. See treatment for RLS.  Melatonin 3mg  but can go up to 10mg .  Stay on same dose of diabetic medication.   Restless Legs Syndrome Restless legs syndrome is a condition that causes uncomfortable feelings or sensations in the legs, especially while sitting or lying down. The sensations usually cause an overwhelming urge to move the legs. The arms can also sometimes be affected. The condition can range from mild to severe. The symptoms often interfere with a person's ability to sleep. CAUSES The cause of this condition is not known. RISK FACTORS This condition is more likely to develop in:  People who are older than age 65.  Pregnant women. In general, restless legs syndrome is more common in women than in men.  People who have a family history of the condition.  People who have certain medical conditions, such as iron deficiency, kidney disease, Parkinson disease, or nerve damage.  People who take certain medicines, such as medicines for high blood pressure, nausea, colds, allergies, depression, and some heart conditions. SYMPTOMS The main symptom of this condition is uncomfortable sensations in the legs. These sensations may be:  Described as pulling, tingling, prickling, throbbing, crawling, or burning.  Worse while you are sitting or lying down.  Worse during periods of rest or inactivity.  Worse at night, often interfering with your sleep.  Accompanied by a very strong urge to move your legs.  Temporarily relieved by movement of your legs. The sensations usually affect both sides of the body. The arms can also be affected, but this is rare. People who have this condition often have tiredness during the day because of their lack of sleep at night. DIAGNOSIS This condition may be diagnosed based on your description of the symptoms. You may also have tests, including blood tests, to check for other conditions that may lead to your symptoms. In some cases, you  may be asked to spend some time in a sleep lab so your sleeping can be monitored. TREATMENT Treatment for this condition is focused on managing the symptoms. Treatment may include:  Self-help and lifestyle changes.  Medicines. HOME CARE INSTRUCTIONS  Take medicines only as directed by your health care provider.  Try these methods to get temporary relief from the uncomfortable sensations:  Massage your legs.  Walk or stretch.  Take a cold or hot bath.  Practice good sleep habits. For example, go to bed and get up at the same time every day.  Exercise regularly.  Practice ways of relaxing, such as yoga or meditation.  Avoid caffeine and alcohol.  Do not use any tobacco products, including cigarettes, chewing tobacco, or electronic cigarettes. If you need help quitting, ask your health care provider.  Keep all follow-up visits as directed by your health care provider. This is important. SEEK MEDICAL CARE IF: Your symptoms do not improve with treatment, or they get worse.   This information is not intended to replace advice given to you by your health care provider. Make sure you discuss any questions you have with your health care provider.   Document Released: 07/19/2002 Document Revised: 12/13/2014 Document Reviewed: 07/25/2014 Elsevier Interactive Patient Education [employer].

## 2015-08-08 DIAGNOSIS — M7550 Bursitis of unspecified shoulder: Secondary | ICD-10-CM | POA: Insufficient documentation

## 2015-08-08 DIAGNOSIS — M79604 Pain in right leg: Secondary | ICD-10-CM | POA: Insufficient documentation

## 2015-08-08 DIAGNOSIS — G4700 Insomnia, unspecified: Secondary | ICD-10-CM | POA: Insufficient documentation

## 2015-08-08 NOTE — Progress Notes (Signed)
   Subjective:    Patient ID: Ann Griffith, female    DOB: Dec 11, 1950, 64 y.o.   MRN: [id number]  HPI Patient is a 64 year old female who presents to the clinic for follow-up on diabetes. She does not check her sugars. She has taken metformin. She has no open wounds or ulcers that are nonhealing. She denies any hypoglycemic event.  Patient needs her cholesterol checked since starting Zocor. She has had no problems with the medication.  Patient would like to be screened for hep C and HIV.  Patient continues to have pain with right shoulder. She is taking ibuprofen which helps. Certainly worse with range of motion. She feels like it's getting a little weaker. She denies any known injury.  Right leg pain she continues to have some occasional right leg pain that occurs mostly at night. Ibuprofen does seem to help. Her legs often do feel jumpy but always worse on the right side. She does have some problems sleeping. She has trouble going to sleep and staying asleep.   Review of Systems  All other systems reviewed and are negative.      Objective:   Physical Exam  Constitutional: She is oriented to person, place, and time. She appears well-developed and well-nourished.  HENT:  Head: Normocephalic and atraumatic.  Cardiovascular: Normal rate, regular rhythm and normal heart sounds.   Pulmonary/Chest: Effort normal and breath sounds normal. She has no wheezes.  Musculoskeletal:  Right shoulder: pain with palpation over posterior shoulder over bursa.  Strength 4/5 of upper extremity.  ROM of right shoulder normal.    Neurological: She is alert and oriented to person, place, and time.  Psychiatric: She has a normal mood and affect. Her behavior is normal.          Assessment & Plan:  DM type II- .[name] Lab Results  Component Value Date   HGBA1C 6.2 08/04/2015   Looks good continue on metformin.  On ACE.  Has flu shot and pneumonia vaccine.  Follow up in 3 months.   Hep c and  HIV screening done.  cmp ordered.   Hyperlipidemia- will recheck and adjust accordingly.   Right shoulder bursitis- exercises given. Injection done today. Ibuprofen 800mg  as needed.   Shoulder Injection Procedure Note  Pre-operative Diagnosis: right shoulder bursitis  Post-operative Diagnosis: same  Indications: shoulder bursitis and pain  Anesthesia: ethyl chloride  Procedure Details   Verbal consent was obtained for the procedure. The shoulder was prepped with iodine and the skin was anesthetized. Using a 22 gauge needle the glenohumeral joint is injected with 9 mL 1% lidocaine and 1 mL of depo medrol under the posterior aspect of the acromion. The injection site was cleansed with topical isopropyl alcohol and a dressing was applied.  Complications:  None; patient tolerated the procedure well.    Right leg pain- could be some muscle cramps. Magnesium 500mg  bid. cmp ordered. Some possible RLS symptomatic HO given.   Insomnia-try melatonin up to 10mg  daily for sleep.

## 2015-08-15 ENCOUNTER — Other Ambulatory Visit: Payer: Self-pay

## 2015-08-15 MED ORDER — AMLODIPINE BESYLATE 2.5 MG PO TABS: ORAL_TABLET | ORAL | Status: DC

## 2015-08-15 MED ORDER — METFORMIN HCL 500 MG PO TABS: ORAL_TABLET | ORAL | Status: DC

## 2015-08-15 MED ORDER — LISINOPRIL 2.5 MG PO TABS: 2.5000 mg | ORAL_TABLET | Freq: Every day | ORAL | Status: DC

## 2015-08-24 ENCOUNTER — Telehealth: Payer: Self-pay | Admitting: Obstetrics & Gynecology

## 2015-08-24 ENCOUNTER — Encounter: Payer: Self-pay | Admitting: Obstetrics & Gynecology

## 2015-08-24 ENCOUNTER — Ambulatory Visit (INDEPENDENT_AMBULATORY_CARE_PROVIDER_SITE_OTHER): Payer: Medicare HMO | Admitting: Obstetrics & Gynecology

## 2015-08-24 VITALS — BP 110/60 | HR 82 | Resp 18 | Ht 67.0 in | Wt 167.0 lb

## 2015-08-24 DIAGNOSIS — Z01419 Encounter for gynecological examination (general) (routine) without abnormal findings: Secondary | ICD-10-CM

## 2015-08-24 MED ORDER — ESTRADIOL 0.1 MG/GM VA CREA: TOPICAL_CREAM | VAGINAL | Status: DC

## 2015-08-24 NOTE — Addendum Note (Signed)
Addended by: Megan Salon on: 08/24/2015 09:51 AM   Modules accepted: Miquel Dunn

## 2015-08-24 NOTE — Telephone Encounter (Signed)
Patient says her insurance will not cover the estrace cream and would like to try something else.

## 2015-08-24 NOTE — Progress Notes (Signed)
65 y.o. G1P1 MarriedCaucasianF here for annual exam.  Doing well.  Denies vaginal bleeding.    PCP:  Iran Planas, PA.  Being seen every three months.  Last hbA1C was 6.2.  Will do blood work with her this year.  Will do Hep C testing with her this year.    Patient's last menstrual period was 08/12/2000.          Sexually active: Yes.    The current method of family planning is post menopausal status.    Exercising: Yes.    Walking, gym Smoker:  no  Health Maintenance: Pap:  06/16/14 Neg History of abnormal Pap:  no MMG:  11/17/14 BIRADS1:neg Colonoscopy:  05/2013 Normal. Repeat 5 years  BMD:   01/2008 normal.  Pt declines doing this as she would decline medication. TDaP:  2011 Screening Labs: PCP, Hb today: PCP, Urine today: PCP   reports that she has never smoked. She has never used smokeless tobacco. She reports that she drinks about 0.5 oz of alcohol per week. She reports that she does not use illicit drugs.  Past Medical History  Diagnosis Date  . DIABETES MELLITUS, TYPE II 11/09/2007    Qualifier: Diagnosis of  By: [name] Mcalpine    . CHOLELITHIASIS, HX OF 11/09/2007    Qualifier: Diagnosis of  By: [name] Mcalpine    . Hyperlipidemia   . Diverticulosis     Past Surgical History  Procedure Laterality Date  . Cholecystectomy    . Colonoscopy  08/2003  . Bone spur removed  2003    left heel    Current Outpatient Prescriptions  Medication Sig Dispense Refill  . AMBULATORY NON FORMULARY MEDICATION Test strips and lancets.  Testing once a day.   Dx: 250.00 100 strip 1  . amLODipine (NORVASC) 2.5 MG tablet TAKE 1-2 TABLET (2.5 MG TOTAL) BY MOUTH DAILY. 180 tablet 2  . aspirin 81 MG tablet Take 81 mg by mouth daily.    . cholecalciferol (VITAMIN D) 1000 UNITS tablet Take 1,000 Units by mouth 3 (three) times daily.     . Cinnamon 500 MG capsule Take 1,000 mg by mouth daily.    . Cyanocobalamin (VITAMIN B-12 PO) Take 5,000 mcg by mouth.    . estradiol (ESTRACE) 0.1  MG/GM vaginal cream Use small amount topically twice weekly 42.5 g 1  . fish oil-omega-3 fatty acids 1000 MG capsule Take 1 g by mouth daily.    [name] ibuprofen (ADVIL,MOTRIN) 800 MG tablet Take 1 tablet (800 mg total) by mouth every 8 (eight) hours as needed. 60 tablet 2  . lisinopril (PRINIVIL,ZESTRIL) 2.5 MG tablet Take 1 tablet (2.5 mg total) by mouth daily. 90 tablet 2  . magnesium 30 MG tablet Take 30 mg by mouth daily.    . metFORMIN (GLUCOPHAGE) 500 MG tablet Take 2 tabs twice a day with meals. 360 tablet 2  . Misc. Devices MISC One touch Ultra test strips 50/package    . simvastatin (ZOCOR) 40 MG tablet Take 1 tablet (40 mg total) by mouth daily. 30 tablet 0   No current facility-administered medications for this visit.    Family History  Problem Relation Age of Onset  . Diabetes Mother     ROS:  Pertinent items are noted in HPI.  Otherwise, a comprehensive ROS was negative.  Exam:   BP 110/60 mmHg  Pulse 82  Resp 18  Ht 5\' 7"  (1.702 m)  Wt 167 lb (75.751 kg)  BMI 26.15 kg/m2  LMP 08/12/2000  Weight change: -13#   Height: 5\' 7"  (170.2 cm)  Ht Readings from Last 3 Encounters:  08/24/15 5\' 7"  (1.702 m)  08/04/15 5\' 7"  (1.702 m)  05/05/15 5\' 7"  (1.702 m)    General appearance: alert, cooperative and appears stated age Head: Normocephalic, without obvious abnormality, atraumatic Neck: no adenopathy, supple, symmetrical, trachea midline and thyroid normal to inspection and palpation Lungs: clear to auscultation bilaterally Breasts: normal appearance, no masses or tenderness Heart: regular rate and rhythm Abdomen: soft, non-tender; bowel sounds normal; no masses,  no organomegaly Extremities: extremities normal, atraumatic, no cyanosis or edema Skin: Skin color, texture, turgor normal. No rashes or lesions Lymph nodes: Cervical, supraclavicular, and axillary nodes normal. No abnormal inguinal nodes palpated Neurologic: Grossly normal   Pelvic: External genitalia:  no  lesions              Urethra:  normal appearing urethra with no masses, tenderness or lesions              Bartholins and Skenes: normal                 Vagina: normal appearing vagina with normal color and discharge, no lesions              Cervix: no lesions              Pap taken: No. Bimanual Exam:  Uterus:  normal size, contour, position, consistency, mobility, non-tender              Adnexa: normal adnexa and no mass, fullness, tenderness               Rectovaginal: Confirms               Anus:  normal sphincter tone, no lesions  Chaperone was present for exam.  A:  Well Woman with normal exam PMP, stopped HRT last year DM, on metformin. Being seen every six months now. Sun exposure--sees derm every other year Vaginal atrophy  P: Mammogram yearly pap smear with neg HR HPV 8/13. Pap t11/15.  No pap today.  RF for vaginal estrace cream 1/2 gm pv twice weekly.  #42.5gm/3RF.  Labs now with PCP. F/U yearly or prn

## 2015-08-24 NOTE — Telephone Encounter (Signed)
Per review of patient's formulary Estrace is covered at tier 4. Vagifem 10 mcg is covered at tier 3. Premarin and Estring are not on the patient's formulary. Please review and advise.

## 2015-08-25 NOTE — Telephone Encounter (Signed)
Patient returning your call won't be able to be reached until this afternoon.

## 2015-08-25 NOTE — Telephone Encounter (Signed)
It's ok to switch to Vagifem 43meq pv twice weekly.  #24/4RF.  Please instruct on use and for her to call if symptom improvement is not as good.  Alternatively, she can try coconut oil.  Use the solid and use a small amount (like the size of a nail) and place vaginally at night twice weekly.  This will give good moisture as well and is much less expensive.  Just get the coconut oil solid in the cooking section.

## 2015-08-25 NOTE — Telephone Encounter (Signed)
Left message to call [name] at [phone]. 

## 2015-08-25 NOTE — Telephone Encounter (Signed)
Spoke with patient. Advised of message as seen below from [place]. Patient would like to try using Coconut Oil at this time. Will return call if this is not providing enough moisture and she would like to try Vagifem.  Routing to provider for final review. Patient agreeable to disposition. Will close encounter.

## 2015-09-11 ENCOUNTER — Telehealth: Payer: Self-pay | Admitting: *Deleted

## 2015-09-11 NOTE — Telephone Encounter (Signed)
Ok to send valtrex 2,000mg  I po bid for one day. vasoline can also help moistuize lips.

## 2015-09-11 NOTE — Telephone Encounter (Signed)
Pt left vm stating that her lips have gotten really sunburned while being in [place]. They have now split a little and she is having pain going down to her chin.  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  She wants to know if you'd be willing to send her something down to a [facility] in [place]. Please advise.

## 2015-09-11 NOTE — Telephone Encounter (Signed)
LMOM for pt to return call to advise of which [facility].

## 2015-09-13 ENCOUNTER — Other Ambulatory Visit: Payer: Self-pay | Admitting: *Deleted

## 2015-09-13 ENCOUNTER — Other Ambulatory Visit: Payer: Self-pay

## 2015-09-13 MED ORDER — VALACYCLOVIR HCL 1 G PO TABS: 1000.0000 mg | ORAL_TABLET | Freq: Two times a day (BID) | ORAL | Status: DC

## 2015-09-13 NOTE — Telephone Encounter (Signed)
[facility] [place]  Pt notified.

## 2015-10-18 DIAGNOSIS — L219 Seborrheic dermatitis, unspecified: Secondary | ICD-10-CM | POA: Diagnosis not present

## 2015-10-18 DIAGNOSIS — Z872 Personal history of diseases of the skin and subcutaneous tissue: Secondary | ICD-10-CM | POA: Diagnosis not present

## 2015-10-18 DIAGNOSIS — L821 Other seborrheic keratosis: Secondary | ICD-10-CM | POA: Diagnosis not present

## 2015-10-18 DIAGNOSIS — Z23 Encounter for immunization: Secondary | ICD-10-CM | POA: Diagnosis not present

## 2015-10-18 DIAGNOSIS — D225 Melanocytic nevi of trunk: Secondary | ICD-10-CM | POA: Diagnosis not present

## 2015-10-18 DIAGNOSIS — B009 Herpesviral infection, unspecified: Secondary | ICD-10-CM | POA: Diagnosis not present

## 2015-10-18 DIAGNOSIS — L811 Chloasma: Secondary | ICD-10-CM | POA: Diagnosis not present

## 2015-10-20 DIAGNOSIS — H524 Presbyopia: Secondary | ICD-10-CM | POA: Diagnosis not present

## 2015-10-20 DIAGNOSIS — Z0100 Encounter for examination of eyes and vision without abnormal findings: Secondary | ICD-10-CM | POA: Diagnosis not present

## 2015-10-20 LAB — HM DIABETES EYE EXAM

## 2015-10-30 DIAGNOSIS — Z79899 Other long term (current) drug therapy: Secondary | ICD-10-CM | POA: Diagnosis not present

## 2015-10-30 DIAGNOSIS — E785 Hyperlipidemia, unspecified: Secondary | ICD-10-CM | POA: Diagnosis not present

## 2015-10-30 DIAGNOSIS — E118 Type 2 diabetes mellitus with unspecified complications: Secondary | ICD-10-CM | POA: Diagnosis not present

## 2015-10-31 LAB — LIPID PANEL
CHOL/HDL RATIO: 2 ratio (ref <=5.0)
Cholesterol: 168 mg/dL (ref 125–200)
HDL: 85 mg/dL (ref >=46)
LDL CALC: 71 mg/dL (ref <130)
Triglycerides: 61 mg/dL (ref <150)
VLDL: 12 mg/dL (ref <30)

## 2015-10-31 LAB — COMPLETE METABOLIC PANEL WITH GFR
ALT: 11 U/L (ref 6–29)
AST: 16 U/L (ref 10–35)
Albumin: 4.2 g/dL (ref 3.6–5.1)
Alkaline Phosphatase: 45 U/L (ref 33–130)
BUN: 16 mg/dL (ref 7–25)
CHLORIDE: 99 mmol/L (ref 98–110)
CO2: 24 mmol/L (ref 20–31)
Calcium: 9.6 mg/dL (ref 8.6–10.4)
Creat: 0.87 mg/dL (ref 0.50–0.99)
GFR, EST NON AFRICAN AMERICAN: 70 mL/min (ref >=60)
GFR, Est African American: 81 mL/min (ref >=60)
GLUCOSE: 113 mg/dL — AB (ref 65–99)
POTASSIUM: 4.6 mmol/L (ref 3.5–5.3)
SODIUM: 135 mmol/L (ref 135–146)
Total Bilirubin: 0.4 mg/dL (ref 0.2–1.2)
Total Protein: 7 g/dL (ref 6.1–8.1)

## 2015-10-31 LAB — HIV ANTIBODY (ROUTINE TESTING W REFLEX): HIV 1&2 Ab, 4th Generation: NONREACTIVE

## 2015-10-31 LAB — HEPATITIS C ANTIBODY: HCV Ab: NEGATIVE

## 2015-11-03 ENCOUNTER — Ambulatory Visit (INDEPENDENT_AMBULATORY_CARE_PROVIDER_SITE_OTHER): Payer: Medicare HMO | Admitting: Physician Assistant

## 2015-11-03 ENCOUNTER — Encounter: Payer: Self-pay | Admitting: Physician Assistant

## 2015-11-03 VITALS — BP 119/68 | HR 74 | Ht 67.0 in | Wt 168.0 lb

## 2015-11-03 DIAGNOSIS — E118 Type 2 diabetes mellitus with unspecified complications: Secondary | ICD-10-CM

## 2015-11-03 DIAGNOSIS — R809 Proteinuria, unspecified: Secondary | ICD-10-CM

## 2015-11-03 DIAGNOSIS — E785 Hyperlipidemia, unspecified: Secondary | ICD-10-CM

## 2015-11-03 LAB — POCT GLYCOSYLATED HEMOGLOBIN (HGB A1C): HEMOGLOBIN A1C: 5.9

## 2015-11-03 LAB — POCT UA - MICROALBUMIN
Albumin/Creatinine Ratio, Urine, POC: <30
Creatinine, POC: 100 mg/dL
MICROALBUMIN (UR) POC: 10 mg/L

## 2015-11-03 NOTE — Progress Notes (Signed)
   Subjective:    Patient ID: Ann Griffith, female    DOB: 1950-10-18, 65 y.o.   MRN: [id number]  HPI Pt presents to the clinic for DM follow up and to discuss labs.   DM- not checking sugars. No hypoglycemic events. No open sores or wounds. Taking metformin bid. Trying to stay active with exercise and healthy diet. Recent eye exam by my eye doctor of [place].    Review of Systems  All other systems reviewed and are negative.      Objective:   Physical Exam  Constitutional: She is oriented to person, place, and time. She appears well-developed and well-nourished.  HENT:  Head: Normocephalic and atraumatic.  Cardiovascular: Normal rate, regular rhythm and normal heart sounds.   Pulmonary/Chest: Effort normal and breath sounds normal.  Neurological: She is alert and oriented to person, place, and time.  Psychiatric: She has a normal mood and affect. Her behavior is normal.          Assessment & Plan:  DM, type II, controlled- .[name] Lab Results  Component Value Date   HGBA1C 5.9 11/03/2015   5.9 doing great.  Continue on metformin.  On ACE.  On STATIN.  EYE exam up to date per pt.  BP looks great.  CMP looks great.  6 month follow up.   Hyperlipidemia- discussed under 100 and looks great. Pt does not need refill of zocor.

## 2015-11-16 DIAGNOSIS — M5416 Radiculopathy, lumbar region: Secondary | ICD-10-CM | POA: Diagnosis not present

## 2015-11-16 DIAGNOSIS — M7551 Bursitis of right shoulder: Secondary | ICD-10-CM | POA: Diagnosis not present

## 2015-11-16 DIAGNOSIS — R69 Illness, unspecified: Secondary | ICD-10-CM | POA: Diagnosis not present

## 2015-12-19 DIAGNOSIS — Z1231 Encounter for screening mammogram for malignant neoplasm of breast: Secondary | ICD-10-CM | POA: Diagnosis not present

## 2016-01-10 ENCOUNTER — Encounter: Payer: Self-pay | Admitting: Obstetrics & Gynecology

## 2016-01-11 ENCOUNTER — Telehealth: Payer: Self-pay | Admitting: Obstetrics & Gynecology

## 2016-01-11 NOTE — Telephone Encounter (Signed)
Left detailed message at number provided [phone], okay per ROI. Advised per our records she still has refills remaining on [facility] Advised she may refill this rx or may use solid coconut oil and will place this vaginally twice per week or as needed for symptoms. Advised to return call with any further questions.  Routing to provider for final review. Patient agreeable to disposition. Will close encounter.

## 2016-01-11 NOTE — Telephone Encounter (Signed)
Patient called and said, "I am out of estrace cream. Can I use coconut oil instead? I seem to remember Dr. [name] Heck telling me that. If so, what kind should I use? Please tell the nurse it is fine to leave a detailed message on my voicemail."

## 2016-01-17 DIAGNOSIS — H25812 Combined forms of age-related cataract, left eye: Secondary | ICD-10-CM | POA: Diagnosis not present

## 2016-01-17 DIAGNOSIS — H527 Unspecified disorder of refraction: Secondary | ICD-10-CM | POA: Diagnosis not present

## 2016-01-17 DIAGNOSIS — E119 Type 2 diabetes mellitus without complications: Secondary | ICD-10-CM | POA: Diagnosis not present

## 2016-01-17 DIAGNOSIS — D2311 Other benign neoplasm of skin of right eyelid, including canthus: Secondary | ICD-10-CM | POA: Diagnosis not present

## 2016-01-17 DIAGNOSIS — H2511 Age-related nuclear cataract, right eye: Secondary | ICD-10-CM | POA: Diagnosis not present

## 2016-02-10 HISTORY — PX: CATARACT EXTRACTION: SUR2

## 2016-02-12 ENCOUNTER — Other Ambulatory Visit: Payer: Self-pay | Admitting: Physician Assistant

## 2016-02-14 DIAGNOSIS — D2312 Other benign neoplasm of skin of left eyelid, including canthus: Secondary | ICD-10-CM | POA: Diagnosis not present

## 2016-02-14 DIAGNOSIS — D2311 Other benign neoplasm of skin of right eyelid, including canthus: Secondary | ICD-10-CM | POA: Diagnosis not present

## 2016-02-14 DIAGNOSIS — H25812 Combined forms of age-related cataract, left eye: Secondary | ICD-10-CM | POA: Diagnosis not present

## 2016-02-14 DIAGNOSIS — H268 Other specified cataract: Secondary | ICD-10-CM | POA: Diagnosis not present

## 2016-02-29 DIAGNOSIS — E1136 Type 2 diabetes mellitus with diabetic cataract: Secondary | ICD-10-CM | POA: Diagnosis not present

## 2016-02-29 DIAGNOSIS — I10 Essential (primary) hypertension: Secondary | ICD-10-CM | POA: Diagnosis not present

## 2016-02-29 DIAGNOSIS — E669 Obesity, unspecified: Secondary | ICD-10-CM | POA: Diagnosis not present

## 2016-02-29 DIAGNOSIS — Z881 Allergy status to other antibiotic agents status: Secondary | ICD-10-CM | POA: Diagnosis not present

## 2016-02-29 DIAGNOSIS — Z7982 Long term (current) use of aspirin: Secondary | ICD-10-CM | POA: Diagnosis not present

## 2016-02-29 DIAGNOSIS — E785 Hyperlipidemia, unspecified: Secondary | ICD-10-CM | POA: Diagnosis not present

## 2016-02-29 DIAGNOSIS — H25812 Combined forms of age-related cataract, left eye: Secondary | ICD-10-CM | POA: Diagnosis not present

## 2016-02-29 DIAGNOSIS — Z79899 Other long term (current) drug therapy: Secondary | ICD-10-CM | POA: Diagnosis not present

## 2016-02-29 DIAGNOSIS — Z6827 Body mass index (BMI) 27.0-27.9, adult: Secondary | ICD-10-CM | POA: Diagnosis not present

## 2016-03-29 ENCOUNTER — Ambulatory Visit (INDEPENDENT_AMBULATORY_CARE_PROVIDER_SITE_OTHER): Payer: Medicare HMO | Admitting: Physician Assistant

## 2016-03-29 ENCOUNTER — Ambulatory Visit (INDEPENDENT_AMBULATORY_CARE_PROVIDER_SITE_OTHER): Payer: Medicare HMO

## 2016-03-29 ENCOUNTER — Encounter: Payer: Self-pay | Admitting: Physician Assistant

## 2016-03-29 VITALS — BP 137/70 | HR 78 | Ht 67.0 in | Wt 174.0 lb

## 2016-03-29 DIAGNOSIS — M25511 Pain in right shoulder: Secondary | ICD-10-CM

## 2016-03-29 DIAGNOSIS — M7711 Lateral epicondylitis, right elbow: Secondary | ICD-10-CM | POA: Diagnosis not present

## 2016-03-29 DIAGNOSIS — G2581 Restless legs syndrome: Secondary | ICD-10-CM

## 2016-03-29 DIAGNOSIS — M7710 Lateral epicondylitis, unspecified elbow: Secondary | ICD-10-CM | POA: Insufficient documentation

## 2016-03-29 MED ORDER — ROPINIROLE HCL 0.25 MG PO TABS: ORAL_TABLET | ORAL | 1 refills | Status: DC

## 2016-03-29 NOTE — Progress Notes (Signed)
   Subjective:    Patient ID: Ann Griffith, female    DOB: Jun 28, 1951, 65 y.o.   MRN: [id number]  HPI Patient is a 65 yo female who presents to the clinic to follow-up on right shoulder pain. She was seen in December/2016 and I suspect it bursitis. Injection was done and she got symptomatic relief for 6 months. She started noticing it during the summer that her shoulder was getting more painful with movement. She is very active and she does a lot of gardening and lifting. Anti-inflammatories do seem to help some. She does not like to take a lot of medicines.  She was also seen about muscle cramps and leg discomfort at bedtime. It comes about 1-2 hours before bed. Magnesium seem to help for a little bit and then stop. We mentioned restless leg syndrome and she would like to seek treatment.  Patient is also having some right lateral elbow pain. Notices it more with a twisting motion. Sometimes it aches when she touches it. Nothing seems to make better. Movement at times makes worse.   Review of Systems  All other systems reviewed and are negative.      Objective:   Physical Exam  Constitutional: She is oriented to person, place, and time. She appears well-developed and well-nourished.  HENT:  Head: Normocephalic and atraumatic.  Cardiovascular: Normal rate, regular rhythm and normal heart sounds.   Pulmonary/Chest: Effort normal and breath sounds normal.  Musculoskeletal:  Right elbow: normal ROM. Tenderness over lateral epicondyle and with pronation of right hand.   Right shoulder-range of motion limited abduction to 110. Tenderness over posterior right shoulder just under acromion.  Strength of right arm 5. No tenderness to palpation over muscle surrounding right shoulder. No pinpoint tenderness over acromion, clavicle or coracoid process  Neurological: She is alert and oriented to person, place, and time.  Psychiatric: She has a normal mood and affect. Her behavior is normal.           Assessment & Plan:  Right shoulder pain- Last injection lasted about 6 months. We'll repeat injection today. If no improvement we'll send sports medicine. NSAIDs, ice, rest as needed. Ordered xray of right shoulder today.  Shoulder Injection Procedure Note  Pre-operative Diagnosis: right shoulder pain/bursitis   Post-operative Diagnosis: same  Indications: bursitis  Anesthesia: ethyl chloride  Procedure Details   Verbal consent was obtained for the procedure. The shoulder was prepped with iodine and the skin was anesthetized. Using a 22 gauge needle the glenohumeral joint is injected with 9 mL 1% lidocaine and 1 mL of depo medrol under the posterior aspect of the acromion. The injection site was cleansed with topical isopropyl alcohol and a dressing was applied.  Complications:  None; patient tolerated the procedure well.   Right lateral epicondylitis-discuss exercises, rest, ice, anti-inflammatories, brace to go below the elbow. Patient is very active and this seems to be worse when she is playing her games. Follow-up with sports medicine this does not improve.  RLS/muscle cramps-

## 2016-03-29 NOTE — Patient Instructions (Signed)
Restless Legs Syndrome Restless legs syndrome is a condition that causes uncomfortable feelings or sensations in the legs, especially while sitting or lying down. The sensations usually cause an overwhelming urge to move the legs. The arms can also sometimes be affected. The condition can range from mild to severe. The symptoms often interfere with a person's ability to sleep. CAUSES The cause of this condition is not known. RISK FACTORS This condition is more likely to develop in:  People who are older than age 50.  Pregnant women. In general, restless legs syndrome is more common in women than in men.  People who have a family history of the condition.  People who have certain medical conditions, such as iron deficiency, kidney disease, Parkinson disease, or nerve damage.  People who take certain medicines, such as medicines for high blood pressure, nausea, colds, allergies, depression, and some heart conditions. SYMPTOMS The main symptom of this condition is uncomfortable sensations in the legs. These sensations may be:  Described as pulling, tingling, prickling, throbbing, crawling, or burning.  Worse while you are sitting or lying down.  Worse during periods of rest or inactivity.  Worse at night, often interfering with your sleep.  Accompanied by a very strong urge to move your legs.  Temporarily relieved by movement of your legs. The sensations usually affect both sides of the body. The arms can also be affected, but this is rare. People who have this condition often have tiredness during the day because of their lack of sleep at night. DIAGNOSIS This condition may be diagnosed based on your description of the symptoms. You may also have tests, including blood tests, to check for other conditions that may lead to your symptoms. In some cases, you may be asked to spend some time in a sleep lab so your sleeping can be monitored. TREATMENT Treatment for this condition is  focused on managing the symptoms. Treatment may include:  Self-help and lifestyle changes.  Medicines. HOME CARE INSTRUCTIONS  Take medicines only as directed by your health care provider.  Try these methods to get temporary relief from the uncomfortable sensations:  Massage your legs.  Walk or stretch.  Take a cold or hot bath.  Practice good sleep habits. For example, go to bed and get up at the same time every day.  Exercise regularly.  Practice ways of relaxing, such as yoga or meditation.  Avoid caffeine and alcohol.  Do not use any tobacco products, including cigarettes, chewing tobacco, or electronic cigarettes. If you need help quitting, ask your health care provider.  Keep all follow-up visits as directed by your health care provider. This is important. SEEK MEDICAL CARE IF: Your symptoms do not improve with treatment, or they get worse.   This information is not intended to replace advice given to you by your health care provider. Make sure you discuss any questions you have with your health care provider.   Document Released: 07/19/2002 Document Revised: 12/13/2014 Document Reviewed: 07/25/2014 Elsevier Interactive Patient Education 2016 [employer]  

## 2016-04-01 ENCOUNTER — Encounter: Payer: Self-pay | Admitting: Physician Assistant

## 2016-04-01 DIAGNOSIS — M25511 Pain in right shoulder: Secondary | ICD-10-CM | POA: Insufficient documentation

## 2016-04-01 DIAGNOSIS — M19011 Primary osteoarthritis, right shoulder: Secondary | ICD-10-CM | POA: Insufficient documentation

## 2016-04-01 DIAGNOSIS — G8929 Other chronic pain: Secondary | ICD-10-CM | POA: Insufficient documentation

## 2016-04-14 ENCOUNTER — Other Ambulatory Visit: Payer: Self-pay | Admitting: Physician Assistant

## 2016-04-18 ENCOUNTER — Other Ambulatory Visit: Payer: Self-pay

## 2016-04-18 MED ORDER — IBUPROFEN 800 MG PO TABS: ORAL_TABLET | ORAL | 2 refills | Status: DC

## 2016-05-09 ENCOUNTER — Other Ambulatory Visit: Payer: Self-pay | Admitting: Physician Assistant

## 2016-05-16 ENCOUNTER — Other Ambulatory Visit: Payer: Self-pay | Admitting: Physician Assistant

## 2016-05-21 ENCOUNTER — Ambulatory Visit: Payer: Medicare HMO | Admitting: Physician Assistant

## 2016-05-21 DIAGNOSIS — M7551 Bursitis of right shoulder: Secondary | ICD-10-CM | POA: Diagnosis not present

## 2016-05-21 DIAGNOSIS — M5416 Radiculopathy, lumbar region: Secondary | ICD-10-CM | POA: Diagnosis not present

## 2016-05-21 DIAGNOSIS — R69 Illness, unspecified: Secondary | ICD-10-CM | POA: Diagnosis not present

## 2016-06-04 ENCOUNTER — Ambulatory Visit (INDEPENDENT_AMBULATORY_CARE_PROVIDER_SITE_OTHER): Payer: Medicare HMO | Admitting: Physician Assistant

## 2016-06-04 ENCOUNTER — Encounter: Payer: Self-pay | Admitting: Physician Assistant

## 2016-06-04 VITALS — BP 130/61 | HR 65 | Ht 67.0 in | Wt 179.0 lb

## 2016-06-04 DIAGNOSIS — H25012 Cortical age-related cataract, left eye: Secondary | ICD-10-CM | POA: Diagnosis not present

## 2016-06-04 DIAGNOSIS — E785 Hyperlipidemia, unspecified: Secondary | ICD-10-CM

## 2016-06-04 DIAGNOSIS — G2581 Restless legs syndrome: Secondary | ICD-10-CM

## 2016-06-04 DIAGNOSIS — R413 Other amnesia: Secondary | ICD-10-CM

## 2016-06-04 DIAGNOSIS — Z23 Encounter for immunization: Secondary | ICD-10-CM

## 2016-06-04 DIAGNOSIS — I7300 Raynaud's syndrome without gangrene: Secondary | ICD-10-CM

## 2016-06-04 DIAGNOSIS — E118 Type 2 diabetes mellitus with unspecified complications: Secondary | ICD-10-CM | POA: Diagnosis not present

## 2016-06-04 DIAGNOSIS — B001 Herpesviral vesicular dermatitis: Secondary | ICD-10-CM

## 2016-06-04 LAB — POCT GLYCOSYLATED HEMOGLOBIN (HGB A1C): HEMOGLOBIN A1C: 5.9

## 2016-06-04 MED ORDER — METFORMIN HCL 500 MG PO TABS: ORAL_TABLET | ORAL | 2 refills | Status: DC

## 2016-06-04 MED ORDER — SIMVASTATIN 40 MG PO TABS: 40.0000 mg | ORAL_TABLET | Freq: Every day | ORAL | 5 refills | Status: DC

## 2016-06-04 MED ORDER — AMLODIPINE BESYLATE 2.5 MG PO TABS: ORAL_TABLET | ORAL | 2 refills | Status: DC

## 2016-06-04 MED ORDER — ROPINIROLE HCL 0.25 MG PO TABS: ORAL_TABLET | ORAL | 2 refills | Status: DC

## 2016-06-04 MED ORDER — MISC. DEVICES MISC: 1 refills | Status: DC

## 2016-06-04 MED ORDER — LISINOPRIL 2.5 MG PO TABS: 2.5000 mg | ORAL_TABLET | Freq: Every day | ORAL | 2 refills | Status: DC

## 2016-06-04 MED ORDER — VALACYCLOVIR HCL 500 MG PO TABS: ORAL_TABLET | ORAL | 1 refills | Status: DC

## 2016-06-04 NOTE — Progress Notes (Signed)
   Subjective:    Patient ID: Ann Griffith, female    DOB: 1951/03/03, 65 y.o.   MRN: [id number]  HPI Pt is a 65 yo female who presents to the clinic for DM follow up. She is not checking her sugars. She is taking metformin daily. She is walking but not watching what she eats. She denies any hypoglycemia. Denies any wounds or sores on legs.  She went to annual eye exam and cataract on left eye diagnosed.   She is a little concerned with memory changes. She just feels like she forgets things a lot. She does not forget persons or names usually. She gives example of leaving with husband and saying I'm going to go by the bank and completely forgetting to go to bank.  No other person has brought up this concern.   RLS doing well. Controlled on requip.   Going on a trip would like valtrex for fever blister outbreak if were to have.    Review of Systems  All other systems reviewed and are negative.      Objective:   Physical Exam  Constitutional: She is oriented to person, place, and time. She appears well-developed and well-nourished.  Cardiovascular: Normal rate, regular rhythm and normal heart sounds.   Pulmonary/Chest: Effort normal and breath sounds normal.  Neurological: She is alert and oriented to person, place, and time.  Psychiatric: She has a normal mood and affect. Her behavior is normal.          Assessment & Plan:  [name] was seen today for diabetes.  Diagnoses and all orders for this visit:  Type 2 diabetes mellitus with complication, without long-term current use of insulin (HCC) -     POCT HgB A1C -     Misc. Devices MISC; One touch Ultra test strips 50/package  To test once a day for DM type II controlled. -     lisinopril (PRINIVIL,ZESTRIL) 2.5 MG tablet; Take 1 tablet (2.5 mg total) by mouth daily. -     metFORMIN (GLUCOPHAGE) 500 MG tablet; Take 2 tabs twice a day with meals. -     simvastatin (ZOCOR) 40 MG tablet; Take 1 tablet (40 mg total) by mouth  daily.  Influenza vaccine needed -     Flu Vaccine QUAD 36+ mos PF IM (Fluarix & Fluzone Quad PF)  Cataract cortical, senile, left  Memory changes  Need for prophylactic vaccination against Streptococcus pneumoniae (pneumococcus) -     Pneumococcal polysaccharide vaccine 23-valent greater than or equal to 2yo subcutaneous/IM  RLS (restless legs syndrome) -     rOPINIRole (REQUIP) 0.25 MG tablet; TAKE 1-4 TABLETS BY MOUTH AT BEDTIME  Raynaud's phenomenon without gangrene -     amLODipine (NORVASC) 2.5 MG tablet; TAKE 1-2 TABLET (2.5 MG TOTAL) BY MOUTH DAILY.  Hyperlipidemia LDL goal <100 -     simvastatin (ZOCOR) 40 MG tablet; Take 1 tablet (40 mg total) by mouth daily.  Recurrent cold sores -     valACYclovir (VALTREX) 500 MG tablet; Take 2 tablets twice a day for 1 day at onset of symptoms.  DM doing great with a1c of 5.4. Metformin refilled. Follow up in 6 months.   Reassurance given on memory changes. 6CIT perfect score. Discussed starting crossword puzzles and using brain more. Continue on b vitamins. Follow up as needed.

## 2016-06-06 DIAGNOSIS — R69 Illness, unspecified: Secondary | ICD-10-CM | POA: Diagnosis not present

## 2016-06-06 DIAGNOSIS — M7551 Bursitis of right shoulder: Secondary | ICD-10-CM | POA: Diagnosis not present

## 2016-06-06 DIAGNOSIS — M5416 Radiculopathy, lumbar region: Secondary | ICD-10-CM | POA: Diagnosis not present

## 2016-06-17 ENCOUNTER — Ambulatory Visit (INDEPENDENT_AMBULATORY_CARE_PROVIDER_SITE_OTHER): Payer: Medicare HMO | Admitting: Physician Assistant

## 2016-06-17 ENCOUNTER — Encounter: Payer: Self-pay | Admitting: Physician Assistant

## 2016-06-17 VITALS — BP 144/71 | HR 75 | Ht 67.0 in | Wt 179.0 lb

## 2016-06-17 DIAGNOSIS — M19011 Primary osteoarthritis, right shoulder: Secondary | ICD-10-CM

## 2016-06-17 DIAGNOSIS — M7551 Bursitis of right shoulder: Secondary | ICD-10-CM

## 2016-06-17 NOTE — Patient Instructions (Signed)
Consider follow up with sports medicine.

## 2016-06-18 NOTE — Progress Notes (Signed)
   Subjective:    Patient ID: Ann Griffith, female    DOB: 10-Sep-1950, 65 y.o.   MRN: [id number]  HPI Pt is a 65 yo female who presents to the clinic wanting another steroid shot in right shoulder for shoulder bursitis. This will be her 3rd injection. 1st injection lasted 6 months, 2nd injection lasted 3 months.    Review of Systems  All other systems reviewed and are negative.      Objective:   Physical Exam  Constitutional: She is oriented to person, place, and time. She appears well-developed and well-nourished.  HENT:  Head: Normocephalic and atraumatic.  Cardiovascular: Normal rate, regular rhythm and normal heart sounds.   Pulmonary/Chest: Effort normal and breath sounds normal.  Musculoskeletal:  Normal ROM of right shoulder.  Tenderness to palpation posterior shoulder.  Strength 5/5 of upper extermities.   Neurological: She is alert and oriented to person, place, and time.  Psychiatric: She has a normal mood and affect. Her behavior is normal.          Assessment & Plan:  Right shoulder pain-  Shoulder Injection Procedure Note  Pre-operative Diagnosis: right shoulder bursitis/pain  Post-operative Diagnosis: same  Indications: pain  Anesthesia: ethyl chloride  Procedure Details   Verbal consent was obtained for the procedure. The shoulder was prepped with iodine and the skin was anesthetized. Using a 22 gauge needle the glenohumeral joint is injected with 9 mL 1% lidocaine and 1 mL of depo medrol 40mg  under the posterior aspect of the acromion. The injection site was cleansed with topical isopropyl alcohol and a dressing was applied.  Complications:  None; patient tolerated the procedure well.  Offered pt appt with sports medicine today to do ultrasound guided injection for better and perhaps longer relief. Pt declined. Discussed next time needed to consider this.

## 2016-06-21 ENCOUNTER — Other Ambulatory Visit: Payer: Self-pay | Admitting: Physician Assistant

## 2016-07-30 DIAGNOSIS — M79674 Pain in right toe(s): Secondary | ICD-10-CM | POA: Diagnosis not present

## 2016-07-30 DIAGNOSIS — L03031 Cellulitis of right toe: Secondary | ICD-10-CM | POA: Diagnosis not present

## 2016-09-02 ENCOUNTER — Other Ambulatory Visit: Payer: Self-pay | Admitting: Physician Assistant

## 2016-09-02 DIAGNOSIS — B001 Herpesviral vesicular dermatitis: Secondary | ICD-10-CM

## 2016-10-20 ENCOUNTER — Other Ambulatory Visit: Payer: Self-pay | Admitting: Physician Assistant

## 2016-10-20 DIAGNOSIS — G2581 Restless legs syndrome: Secondary | ICD-10-CM

## 2016-10-28 ENCOUNTER — Other Ambulatory Visit (HOSPITAL_COMMUNITY)
Admission: RE | Admit: 2016-10-28 | Discharge: 2016-10-28 | Disposition: A | Discharge status: Home / Self Care | Payer: Medicare HMO | Source: Ambulatory Visit | Attending: Obstetrics & Gynecology | Admitting: Obstetrics & Gynecology

## 2016-10-28 ENCOUNTER — Ambulatory Visit (INDEPENDENT_AMBULATORY_CARE_PROVIDER_SITE_OTHER): Payer: Medicare HMO | Admitting: Obstetrics & Gynecology

## 2016-10-28 ENCOUNTER — Encounter: Payer: Self-pay | Admitting: Obstetrics & Gynecology

## 2016-10-28 VITALS — BP 112/66 | HR 84 | Resp 14 | Ht 66.5 in | Wt 177.8 lb

## 2016-10-28 DIAGNOSIS — Z124 Encounter for screening for malignant neoplasm of cervix: Secondary | ICD-10-CM

## 2016-10-28 DIAGNOSIS — Z01419 Encounter for gynecological examination (general) (routine) without abnormal findings: Secondary | ICD-10-CM

## 2016-10-28 MED ORDER — EFLORNITHINE HCL 13.9 % EX CREA: 1.0000 "application " | TOPICAL_CREAM | Freq: Two times a day (BID) | CUTANEOUS | 5 refills | Status: DC

## 2016-10-28 NOTE — Addendum Note (Signed)
Addended by: Megan Salon on: 10/28/2016 05:15 PM   Modules accepted: Orders

## 2016-10-28 NOTE — Progress Notes (Signed)
66 y.o. G1P1 Married Caucasian F here for annual exam.  Doing well except for shoulder bursitis.  Has been recommended to see a sports medicine physician.  Pt reports she's not sure she wants to proceed with this at this point.  Has some mild facial hair.  Would like to try topical treatment for this.  Patient's last menstrual period was 08/12/2000.          Sexually active: Yes.    The current method of family planning is post menopausal status.    Exercising: Yes.    walking, machines Smoker:  no  Health Maintenance: Pap:  06/16/14 negative  History of abnormal Pap:  no MMG:  12/19/15 BIRADS 1 negative  Colonoscopy:  06/03/13 normal- repeat 5 years  BMD:   01/11/08  TDaP:  12/21/09 Pneumonia vaccine(s):  05/05/15, 06/04/16  Zostavax:   PCP Hep C testing: 10/30/15 negative  Screening Labs: PCP, Hb today: PCP   reports that she has never smoked. She has never used smokeless tobacco. She reports that she drinks about 0.5 oz of alcohol per week . She reports that she does not use drugs.  Past Medical History:  Diagnosis Date  . CHOLELITHIASIS, HX OF 11/09/2007   Qualifier: Diagnosis of  By: [name] Mcalpine    . DIABETES MELLITUS, TYPE II 11/09/2007   Qualifier: Diagnosis of  By: [name] Mcalpine    . Diverticulosis   . Hyperlipidemia     Past Surgical History:  Procedure Laterality Date  . bone spur removed  2003   left heel  . CHOLECYSTECTOMY    . COLONOSCOPY  08/2003    Current Outpatient Prescriptions  Medication Sig Dispense Refill  . AMBULATORY NON FORMULARY MEDICATION Test strips and lancets.  Testing once a day.   Dx: 250.00 100 strip 1  . amLODipine (NORVASC) 2.5 MG tablet TAKE 1-2 TABLET (2.5 MG TOTAL) BY MOUTH DAILY. 180 tablet 2  . aspirin 81 MG tablet Take 81 mg by mouth daily.    . cholecalciferol (VITAMIN D) 1000 UNITS tablet Take 1,000 Units by mouth 3 (three) times daily.     . Cyanocobalamin (VITAMIN B-12 PO) Take 5,000 mcg by mouth.    . fish  oil-omega-3 fatty acids 1000 MG capsule Take 1 g by mouth daily.    [name] ibuprofen (ADVIL,MOTRIN) 800 MG tablet TAKE 1 TABLET (800 MG TOTAL) BY MOUTH EVERY 8 (EIGHT) HOURS AS NEEDED. 60 tablet 2  . lisinopril (PRINIVIL,ZESTRIL) 2.5 MG tablet Take 1 tablet (2.5 mg total) by mouth daily. 90 tablet 2  . metFORMIN (GLUCOPHAGE) 500 MG tablet Take 2 tabs twice a day with meals. 360 tablet 2  . Misc. Devices MISC One touch Ultra test strips 50/package  To test once a day for DM type II controlled. 50 each 1  . rOPINIRole (REQUIP) 0.25 MG tablet TAKE 1-4 TABLETS BY MOUTH AT BEDTIME 90 tablet 5  . simvastatin (ZOCOR) 40 MG tablet Take 1 tablet (40 mg total) by mouth daily. 30 tablet 5  . valACYclovir (VALTREX) 500 MG tablet TAKE 2 TABLETS TWICE A DAY FOR 1 DAY AT ONSET OF SYMPTOMS. 32 tablet 1   No current facility-administered medications for this visit.     Family History  Problem Relation Age of Onset  . Diabetes Mother     ROS:  Pertinent items are noted in HPI.  Otherwise, a comprehensive ROS was negative.  Exam:   BP 112/66 (BP Location: Right Arm, Patient Position: Sitting, Cuff Size:  Normal)   Pulse 84   Resp 14   Ht 5' 6.5" (1.689 m)   Wt 177 lb 12.8 oz (80.6 kg)   LMP 08/12/2000   BMI 28.27 kg/m   Weight change: +10# Height: 5' 6.5" (168.9 cm)  Ht Readings from Last 3 Encounters:  10/28/16 5' 6.5" (1.689 m)  06/17/16 5\' 7"  (1.702 m)  06/04/16 5\' 7"  (1.702 m)    General appearance: alert, cooperative and appears stated age Head: Normocephalic, without obvious abnormality, atraumatic Neck: no adenopathy, supple, symmetrical, trachea midline and thyroid normal to inspection and palpation Lungs: clear to auscultation bilaterally Breasts: normal appearance, no masses or tenderness Heart: regular rate and rhythm Abdomen: soft, non-tender; bowel sounds normal; no masses,  no organomegaly Extremities: extremities normal, atraumatic, no cyanosis or edema Skin: Skin color, texture,  turgor normal. No rashes or lesions Lymph nodes: Cervical, supraclavicular, and axillary nodes normal. No abnormal inguinal nodes palpated Neurologic: Grossly normal   Pelvic: External genitalia:  no lesions              Urethra:  normal appearing urethra with no masses, tenderness or lesions              Bartholins and Skenes: normal                 Vagina: normal appearing vagina with normal color and discharge, no lesions              Cervix: no lesions              Pap taken: Yes.   Bimanual Exam:  Uterus:  normal size, contour, position, consistency, mobility, non-tender              Adnexa: normal adnexa and no mass, fullness, tenderness               Rectovaginal: Confirms               Anus:  normal sphincter tone, no lesions  Chaperone was present for exam.  A:  Well Woman with normal exam PMP, off HRT Diabetes, on metformin with HbA1C 5.9 last year Sun exposure--sees derm every other year Vaginal atrophy Facial hair, menopausal related  P:   Mammogram yearly.  Guidelines reviewed. pap smear obtained today.  Neg pap with neg 8/13 Vaginal estrace cream 1/2 gm Labs and vaccine are UTD with PCP Vaniqa 13.9% cream BID.  #45gm tube/5RF.  Rx to pt.  She knows to use on other part of body first to ensure does not cause a topical reaction. return annually or prn

## 2016-10-28 NOTE — Patient Instructions (Signed)
Plan to schedule your bone density the same day you do your mammogram.

## 2016-11-01 ENCOUNTER — Telehealth: Payer: Self-pay | Admitting: Obstetrics & Gynecology

## 2016-11-01 LAB — CYTOLOGY - PAP: DIAGNOSIS: NEGATIVE

## 2016-11-01 NOTE — Telephone Encounter (Signed)
We do not have samples for this.  I'm sorry.  I wrote for the generic for her.  Brand is more expensive.  Another option for her to look into is laser hair removal but this will likely cost more but is permanent.  Waxing is possible as well.

## 2016-11-01 NOTE — Telephone Encounter (Signed)
Left message to call [place] at [phone].  Rx for Eflornithine HCL 13.9 % cream is the generic for Vaniqa 13.9 % cream.

## 2016-11-01 NOTE — Telephone Encounter (Signed)
Spoke with patient. Patient states that she spoke with her insurance company regarding coverage for Eflornithine. States that even with Good Rx coupon this will be over $100. Advised patient this is the generic for the brand of the medication Vaniqa. Patient is also asking about possible samples from the manufacturer. Patient does not want to purchase rx at such a high cost in case she has a reaction to it. Advised will review with Dr.Miller regarding recommendations.

## 2016-11-01 NOTE — Telephone Encounter (Signed)
Patient is calling to see if there is a generic medication because her insurance wont cover the brand name. She is using [facility] [address].

## 2016-11-04 NOTE — Telephone Encounter (Signed)
Left message to call Farrie Sann at [phone]. 

## 2016-11-05 ENCOUNTER — Telehealth: Payer: Self-pay | Admitting: *Deleted

## 2016-11-05 NOTE — Telephone Encounter (Signed)
Call to patient. Notified of pap smear results and verbalized understanding.

## 2016-11-12 ENCOUNTER — Other Ambulatory Visit: Payer: Self-pay | Admitting: Physician Assistant

## 2016-11-12 DIAGNOSIS — E118 Type 2 diabetes mellitus with unspecified complications: Secondary | ICD-10-CM

## 2016-11-21 NOTE — Telephone Encounter (Signed)
Left message to call [name] at [phone]. 

## 2016-11-27 NOTE — Telephone Encounter (Signed)
Dr.Miller, I have attempted to reach this patient x 2 without return call. Okay to close encounter?

## 2016-11-28 NOTE — Telephone Encounter (Signed)
Yes, ok to close encounter. 

## 2016-11-29 ENCOUNTER — Telehealth: Payer: Self-pay | Admitting: Obstetrics & Gynecology

## 2016-11-29 NOTE — Telephone Encounter (Signed)
Left message to call Ann Griffith at [phone].  

## 2016-11-29 NOTE — Telephone Encounter (Signed)
Patient states she is returning a call from last week but she is not sure what it was about. Routing to triage since there is no open phone note.

## 2016-12-03 ENCOUNTER — Encounter: Payer: Self-pay | Admitting: Physician Assistant

## 2016-12-03 ENCOUNTER — Ambulatory Visit (INDEPENDENT_AMBULATORY_CARE_PROVIDER_SITE_OTHER): Payer: Medicare HMO | Admitting: Physician Assistant

## 2016-12-03 VITALS — BP 122/70 | HR 81 | Ht 65.5 in | Wt 176.0 lb

## 2016-12-03 DIAGNOSIS — E7800 Pure hypercholesterolemia, unspecified: Secondary | ICD-10-CM

## 2016-12-03 DIAGNOSIS — R5383 Other fatigue: Secondary | ICD-10-CM | POA: Diagnosis not present

## 2016-12-03 DIAGNOSIS — E119 Type 2 diabetes mellitus without complications: Secondary | ICD-10-CM

## 2016-12-03 DIAGNOSIS — M25511 Pain in right shoulder: Secondary | ICD-10-CM | POA: Diagnosis not present

## 2016-12-03 DIAGNOSIS — G8929 Other chronic pain: Secondary | ICD-10-CM

## 2016-12-03 DIAGNOSIS — Z79899 Other long term (current) drug therapy: Secondary | ICD-10-CM

## 2016-12-03 DIAGNOSIS — G2581 Restless legs syndrome: Secondary | ICD-10-CM

## 2016-12-03 LAB — VITAMIN B12: Vitamin B-12: 745 pg/mL (ref 200–1100)

## 2016-12-03 LAB — COMPLETE METABOLIC PANEL WITH GFR
ALT: 10 U/L (ref 6–29)
AST: 14 U/L (ref 10–35)
Albumin: 4.3 g/dL (ref 3.6–5.1)
Alkaline Phosphatase: 46 U/L (ref 33–130)
BUN: 11 mg/dL (ref 7–25)
CHLORIDE: 102 mmol/L (ref 98–110)
CO2: 22 mmol/L (ref 20–31)
Calcium: 9.7 mg/dL (ref 8.6–10.4)
Creat: 1.03 mg/dL — ABNORMAL HIGH (ref 0.50–0.99)
GFR, EST AFRICAN AMERICAN: 65 mL/min (ref >=60)
GFR, EST NON AFRICAN AMERICAN: 56 mL/min — AB (ref >=60)
GLUCOSE: 123 mg/dL — AB (ref 65–99)
Potassium: 4.2 mmol/L (ref 3.5–5.3)
SODIUM: 136 mmol/L (ref 135–146)
Total Bilirubin: 0.5 mg/dL (ref 0.2–1.2)
Total Protein: 7.1 g/dL (ref 6.1–8.1)

## 2016-12-03 LAB — TSH: TSH: 2.47 m[IU]/L

## 2016-12-03 LAB — LIPID PANEL W/REFLEX DIRECT LDL
Cholesterol: 190 mg/dL (ref <200)
HDL: 82 mg/dL (ref >50)
LDL-CHOLESTEROL: 88 mg/dL
NON-HDL CHOLESTEROL (CALC): 108 mg/dL (ref <130)
TRIGLYCERIDES: 103 mg/dL (ref <150)
Total CHOL/HDL Ratio: 2.3 Ratio (ref <5.0)

## 2016-12-03 NOTE — Patient Instructions (Signed)
Carpal Tunnel Syndrome Carpal tunnel syndrome is a condition that causes pain in your hand and arm. The carpal tunnel is a narrow area that is on the palm side of your wrist. Repeated wrist motion or certain diseases may cause swelling in the tunnel. This swelling can pinch the main nerve in the wrist (median nerve). Follow these instructions at home: If you have a splint:   Wear it as told by your doctor. Remove it only as told by your doctor.  Loosen the splint if your fingers:  Become numb and tingle.  Turn blue and cold.  Keep the splint clean and dry. General instructions   Take over-the-counter and prescription medicines only as told by your doctor.  Rest your wrist from any activity that may be causing your pain. If needed, talk to your employer about changes that can be made in your work, such as getting a wrist pad to use while typing.  If directed, apply ice to the painful area:  Put ice in a plastic bag.  Place a towel between your skin and the bag.  Leave the ice on for 20 minutes, 2-3 times per day.  Keep all follow-up visits as told by your doctor. This is important.  Do any exercises as told by your doctor, physical therapist, or occupational therapist. Contact a doctor if:  You have new symptoms.  Medicine does not help your pain.  Your symptoms get worse. This information is not intended to replace advice given to you by your health care provider. Make sure you discuss any questions you have with your health care provider. Document Released: 07/18/2011 Document Revised: 01/04/2016 Document Reviewed: 12/14/2014 Elsevier Interactive Patient Education  2017 [employer]  

## 2016-12-04 LAB — HEMOGLOBIN A1C
HEMOGLOBIN A1C: 5.9 % — AB (ref <5.7)
Mean Plasma Glucose: 123 mg/dL

## 2016-12-05 NOTE — Progress Notes (Signed)
   Subjective:    Patient ID: Ann Griffith, female    DOB: 1950/11/27, 66 y.o.   MRN: [id number]  HPI Pt is a 66 yo female who presents to the clinic for 3 month DM follow up. Not checking sugars. Only on metformin. No hypoglycemic events. She remains very active. Denies any open sores or wounds.   RLS- controlled. Needs refill  Continues to have right shoulder pain. Good for about 3 months after shot but then pain comes back. She is concerned about cost of u/s guided injection for better relief. She is very active. Ibuprofen as needed does help.    Review of Systems  All other systems reviewed and are negative.      Objective:   Physical Exam  Constitutional: She is oriented to person, place, and time. She appears well-developed and well-nourished.  HENT:  Head: Normocephalic and atraumatic.  Cardiovascular: Normal rate, regular rhythm and normal heart sounds.   Pulmonary/Chest: Effort normal and breath sounds normal.  Neurological: She is alert and oriented to person, place, and time.  Psychiatric: She has a normal mood and affect. Her behavior is normal.          Assessment & Plan:  [name] was seen today for diabetes, hypertension and hyperlipidemia.  Diagnoses and all orders for this visit:  Pure hypercholesterolemia -     Lipid Panel w/reflex Direct LDL  RLS (restless legs syndrome)  Controlled type 2 diabetes mellitus without complication, without long-term current use of insulin (HCC) -     HgB A1c  No energy -     COMPLETE METABOLIC PANEL WITH GFR -     B12 -     TSH  Medication management -     COMPLETE METABOLIC PANEL WITH GFR  Chronic right shoulder pain   Labs ordered. Hx of controlled BS.   Right shoulder pain improves with injections for about 3 months. She was encouraged to have injection done by Dr. Darene Lamer or Georgina Snell to get better longer relief. She is concerned about cost. Continues to use ibuprofen for pain relief.       Shoulder  Injection Procedure Note  Pre-operative Diagnosis: right shoulder bursitis  Post-operative Diagnosis: same  Indications: symptoms relief.  Anesthesia: ethyl chloride spray  Procedure Details   Verbal consent was obtained for the procedure. The shoulder was prepped with iodine and the skin was anesthetized. Using a 22 gauge needle the glenohumeral joint is injected with 9 mL 1% lidocaine and 1 mL of depo medrol 40 under the posterior aspect of the acromion. The injection site was cleansed with topical isopropyl alcohol and a dressing was applied.  Complications:  None; patient tolerated the procedure well.

## 2016-12-05 NOTE — Telephone Encounter (Signed)
Left message to call Toia Micale at [phone].  

## 2016-12-10 ENCOUNTER — Encounter: Payer: Self-pay | Admitting: Physician Assistant

## 2016-12-10 DIAGNOSIS — R7989 Other specified abnormal findings of blood chemistry: Secondary | ICD-10-CM | POA: Insufficient documentation

## 2016-12-10 NOTE — Telephone Encounter (Signed)
Dr. [name] Heck, attempted to contact patient x2 with no return call, ok to close encounter?

## 2016-12-10 NOTE — Telephone Encounter (Signed)
OK to close encounter. 

## 2017-01-07 DIAGNOSIS — Z1231 Encounter for screening mammogram for malignant neoplasm of breast: Secondary | ICD-10-CM | POA: Diagnosis not present

## 2017-01-31 ENCOUNTER — Encounter: Payer: Self-pay | Admitting: Obstetrics & Gynecology

## 2017-02-19 ENCOUNTER — Other Ambulatory Visit: Payer: Self-pay | Admitting: Physician Assistant

## 2017-02-19 DIAGNOSIS — E118 Type 2 diabetes mellitus with unspecified complications: Secondary | ICD-10-CM

## 2017-03-20 DIAGNOSIS — Z6827 Body mass index (BMI) 27.0-27.9, adult: Secondary | ICD-10-CM | POA: Diagnosis not present

## 2017-03-20 DIAGNOSIS — Z791 Long term (current) use of non-steroidal anti-inflammatories (NSAID): Secondary | ICD-10-CM | POA: Diagnosis not present

## 2017-03-20 DIAGNOSIS — E118 Type 2 diabetes mellitus with unspecified complications: Secondary | ICD-10-CM | POA: Diagnosis not present

## 2017-03-20 DIAGNOSIS — E785 Hyperlipidemia, unspecified: Secondary | ICD-10-CM | POA: Diagnosis not present

## 2017-03-20 DIAGNOSIS — G909 Disorder of the autonomic nervous system, unspecified: Secondary | ICD-10-CM | POA: Diagnosis not present

## 2017-03-20 DIAGNOSIS — G2581 Restless legs syndrome: Secondary | ICD-10-CM | POA: Diagnosis not present

## 2017-03-20 DIAGNOSIS — Z0000 Encounter for general adult medical examination without abnormal findings: Secondary | ICD-10-CM | POA: Diagnosis not present

## 2017-03-20 DIAGNOSIS — M719 Bursopathy, unspecified: Secondary | ICD-10-CM | POA: Diagnosis not present

## 2017-03-20 DIAGNOSIS — G4700 Insomnia, unspecified: Secondary | ICD-10-CM | POA: Diagnosis not present

## 2017-03-20 DIAGNOSIS — M059 Rheumatoid arthritis with rheumatoid factor, unspecified: Secondary | ICD-10-CM | POA: Diagnosis not present

## 2017-03-20 DIAGNOSIS — Z79899 Other long term (current) drug therapy: Secondary | ICD-10-CM | POA: Diagnosis not present

## 2017-04-28 DIAGNOSIS — H2511 Age-related nuclear cataract, right eye: Secondary | ICD-10-CM | POA: Diagnosis not present

## 2017-04-28 DIAGNOSIS — H524 Presbyopia: Secondary | ICD-10-CM | POA: Diagnosis not present

## 2017-04-28 DIAGNOSIS — H26492 Other secondary cataract, left eye: Secondary | ICD-10-CM | POA: Diagnosis not present

## 2017-05-08 ENCOUNTER — Other Ambulatory Visit: Payer: Self-pay | Admitting: Physician Assistant

## 2017-05-08 DIAGNOSIS — G2581 Restless legs syndrome: Secondary | ICD-10-CM

## 2017-05-10 ENCOUNTER — Other Ambulatory Visit: Payer: Self-pay | Admitting: Physician Assistant

## 2017-05-10 DIAGNOSIS — I7300 Raynaud's syndrome without gangrene: Secondary | ICD-10-CM

## 2017-05-18 ENCOUNTER — Other Ambulatory Visit: Payer: Self-pay | Admitting: Physician Assistant

## 2017-05-18 DIAGNOSIS — E118 Type 2 diabetes mellitus with unspecified complications: Secondary | ICD-10-CM

## 2017-06-03 ENCOUNTER — Ambulatory Visit: Payer: Medicare HMO | Admitting: Physician Assistant

## 2017-06-18 ENCOUNTER — Other Ambulatory Visit: Payer: Self-pay | Admitting: Physician Assistant

## 2017-06-18 DIAGNOSIS — E118 Type 2 diabetes mellitus with unspecified complications: Secondary | ICD-10-CM

## 2017-06-24 ENCOUNTER — Ambulatory Visit: Payer: Medicare HMO | Admitting: Physician Assistant

## 2017-06-24 ENCOUNTER — Encounter: Payer: Self-pay | Admitting: Physician Assistant

## 2017-06-24 ENCOUNTER — Ambulatory Visit (INDEPENDENT_AMBULATORY_CARE_PROVIDER_SITE_OTHER): Payer: Medicare HMO

## 2017-06-24 VITALS — BP 131/67 | HR 74 | Ht 65.5 in | Wt 174.0 lb

## 2017-06-24 DIAGNOSIS — Z23 Encounter for immunization: Secondary | ICD-10-CM | POA: Diagnosis not present

## 2017-06-24 DIAGNOSIS — L853 Xerosis cutis: Secondary | ICD-10-CM | POA: Diagnosis not present

## 2017-06-24 DIAGNOSIS — S8991XA Unspecified injury of right lower leg, initial encounter: Secondary | ICD-10-CM | POA: Diagnosis not present

## 2017-06-24 DIAGNOSIS — G5601 Carpal tunnel syndrome, right upper limb: Secondary | ICD-10-CM | POA: Diagnosis not present

## 2017-06-24 DIAGNOSIS — M2341 Loose body in knee, right knee: Secondary | ICD-10-CM | POA: Diagnosis not present

## 2017-06-24 DIAGNOSIS — M170 Bilateral primary osteoarthritis of knee: Secondary | ICD-10-CM

## 2017-06-24 DIAGNOSIS — M179 Osteoarthritis of knee, unspecified: Secondary | ICD-10-CM | POA: Diagnosis not present

## 2017-06-24 DIAGNOSIS — M65342 Trigger finger, left ring finger: Secondary | ICD-10-CM

## 2017-06-24 DIAGNOSIS — M25561 Pain in right knee: Secondary | ICD-10-CM | POA: Diagnosis not present

## 2017-06-24 DIAGNOSIS — E119 Type 2 diabetes mellitus without complications: Secondary | ICD-10-CM | POA: Diagnosis not present

## 2017-06-24 DIAGNOSIS — M2342 Loose body in knee, left knee: Secondary | ICD-10-CM | POA: Diagnosis not present

## 2017-06-24 LAB — POCT GLYCOSYLATED HEMOGLOBIN (HGB A1C): Hemoglobin A1C: 6.7

## 2017-06-24 MED ORDER — DAPAGLIFLOZIN PRO-METFORMIN ER 10-1000 MG PO TB24: 1.0000 | ORAL_TABLET | Freq: Every day | ORAL | 0 refills | Status: DC

## 2017-06-24 MED ORDER — TRIAMCINOLONE ACETONIDE 0.1 % EX CREA: 1.0000 "application " | TOPICAL_CREAM | Freq: Two times a day (BID) | CUTANEOUS | 0 refills | Status: DC

## 2017-06-24 NOTE — Patient Instructions (Addendum)
xigduo start Trigger Finger injection.  Get right knee hydrodissection of median nerve   Carpal Tunnel Syndrome Carpal tunnel syndrome is a condition that causes pain in your hand and arm. The carpal tunnel is a narrow area that is on the palm side of your wrist. Repeated wrist motion or certain diseases may cause swelling in the tunnel. This swelling can pinch the main nerve in the wrist (median nerve). Follow these instructions at home: If you have a splint:  Wear it as told by your doctor. Remove it only as told by your doctor.  Loosen the splint if your fingers: ? Become numb and tingle. ? Turn blue and cold.  Keep the splint clean and dry. General instructions  Take over-the-counter and prescription medicines only as told by your doctor.  Rest your wrist from any activity that may be causing your pain. If needed, talk to your employer about changes that can be made in your work, such as getting a wrist pad to use while typing.  If directed, apply ice to the painful area: ? Put ice in a plastic bag. ? Place a towel between your skin and the bag. ? Leave the ice on for 20 minutes, 2-3 times per day.  Keep all follow-up visits as told by your doctor. This is important.  Do any exercises as told by your doctor, physical therapist, or occupational therapist. Contact a doctor if:  You have new symptoms.  Medicine does not help your pain.  Your symptoms get worse. This information is not intended to replace advice given to you by your health care provider. Make sure you discuss any questions you have with your health care provider. Document Released: 07/18/2011 Document Revised: 01/04/2016 Document Reviewed: 12/14/2014 Elsevier Interactive Patient Education  2018 [employer].    Trigger Finger Trigger finger (stenosing tenosynovitis) is a condition that causes a finger to get stuck in a bent position. Each finger has a tough, cord-like tissue that connects muscle to bone  (tendon), and each tendon is surrounded by a tunnel of tissue (tendon sheath). To move your finger, your tendon needs to slide freely through the sheath. Trigger finger happens when the tendon or the sheath thickens, making it difficult to move your finger. Trigger finger can affect any finger or a thumb. It may affect more than one finger. Mild cases may clear up with rest and medicine. Severe cases require more treatment. What are the causes? Trigger finger is caused by a thickened finger tendon or tendon sheath. The cause of this thickening is not known. What increases the risk? The following factors may make you more likely to develop this condition:  Doing activities that require a strong grip.  Having rheumatoid arthritis, gout, or diabetes.  Being 65-44 years old.  Being a woman.  What are the signs or symptoms? Symptoms of this condition include:  Pain when bending or straightening your finger.  Tenderness or swelling where your finger attaches to the palm of your hand.  A lump in the palm of your hand or on the inside of your finger.  Hearing a popping sound when you try to straighten your finger.  Feeling a popping, catching, or locking sensation when you try to straighten your finger.  Being unable to straighten your finger.  How is this diagnosed? This condition is diagnosed based on your symptoms and a physical exam. How is this treated? This condition may be treated by:  Resting your finger and avoiding activities that make symptoms  worse.  Wearing a finger splint to keep your finger in a slightly bent position.  Taking NSAIDs to relieve pain and swelling.  Injecting medicine (steroids) into the tendon sheath to reduce swelling and irritation. Injections may need to be repeated.  Having surgery to open the tendon sheath. This may be done if other treatments do not work and you cannot straighten your finger. You may need physical therapy after  surgery.  Follow these instructions at home:  Use moist heat to help reduce pain and swelling as told by your health care provider.  Rest your finger and avoid activities that make pain worse. Return to normal activities as told by your health care provider.  If you have a splint, wear it as told by your health care provider.  Take over-the-counter and prescription medicines only as told by your health care provider.  Keep all follow-up visits as told by your health care provider. This is important. Contact a health care provider if:  Your symptoms are not improving with home care. Summary  Trigger finger (stenosing tenosynovitis) causes your finger to get stuck in a bent position, and it can make it difficult and painful to straighten your finger.  This condition develops when a finger tendon or tendon sheath thickens.  Treatment starts with resting, wearing a splint, and taking NSAIDs.  In severe cases, surgery to open the tendon sheath may be needed. This information is not intended to replace advice given to you by your health care provider. Make sure you discuss any questions you have with your health care provider. Document Released: 05/18/2004 Document Revised: 07/09/2016 Document Reviewed: 07/09/2016 Elsevier Interactive Patient Education  2017 [employer].

## 2017-06-24 NOTE — Progress Notes (Signed)
Subjective:    Patient ID: Ann Griffith, female    DOB: 1951/04/02, 66 y.o.   MRN: [id number]  HPI  Pt is a 66 yo pleasant female who presents to the clinic for 6 month follow up.   She has pre-diabetes and we are monitoring her a1C. She does not check her sugars. She is taking metformin 500mg  twice a day.   Pt also has a catching in her left ring finger for last 6 months. She does not have any pain. She has noticed her finger is more swollen and now cannot get her ring off. At times her finger completely gets stuck unless she manueling pulls it back out. She has not tried anything to make better.   She is having some right wrist, hand, finger numbness. Worse at night in morning. She usually can "shake it out". Sometimes better than others. She has been feeling a little more weak in her right hand.   Right knee has been hurting as well and pain goes up in to right thigh. No injury. She takes tylenol and helps. She has never had xrays.   Pt has a dry scaly itchy patch of skin in the corner of her left mouth. No vesicles, pain or discharge.   .. Active Ambulatory Problems    Diagnosis Date Noted  . Diabetes mellitus, type II ([place]) 11/09/2007  . 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 CHOLELITHIASIS, HX OF 11/09/2007  . Diverticulosis 06/07/2013  . Raynaud's syndrome 08/09/2013  . Benign paroxysmal positional vertigo 11/02/2014  . Microalbuminuria 11/02/2014  . Hyperlipidemia 11/03/2014  . Seborrheic dermatitis 03/14/2015  . Memory changes 05/05/2015  . Insomnia 08/08/2015  . Right leg pain 08/08/2015  . Shoulder bursitis 08/08/2015  . Avitaminosis D 10/09/2012  . Colon polyp 04/24/2011  . Tennis elbow syndrome 03/29/2016  . RLS (restless legs syndrome) 03/29/2016  . Right shoulder pain 03/29/2016  . 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 Cataract cortical, senile, left 06/04/2016  . Elevated serum creatinine 12/10/2016  . Tricompartment osteoarthritis of knees,  bilateral 06/25/2017  . Trigger finger, left ring finger 06/26/2017  . Acute pain of right knee 06/26/2017  . Controlled type 2 diabetes mellitus without complication, without long-term current use of insulin ([place]) 06/26/2017  . Carpal tunnel syndrome, right 06/26/2017  . Dry skin dermatitis 06/26/2017   Resolved Ambulatory Problems    Diagnosis Date Noted  . Eustachian tube dysfunction 05/02/2014   Past Medical History:  Diagnosis Date  . CHOLELITHIASIS, HX OF 11/09/2007  . DIABETES MELLITUS, TYPE II 11/09/2007  . Diverticulosis   . Hyperlipidemia        Review of Systems  All other systems reviewed and are negative.      Objective:   Physical Exam  Constitutional: She is oriented to person, place, and time. She appears well-developed and well-nourished.  HENT:  Head: Normocephalic and atraumatic.  Cardiovascular: Normal rate, regular rhythm and normal heart sounds.  Pulmonary/Chest: Effort normal and breath sounds normal.  Musculoskeletal:  Left ring finger: no catching on exam. No tenderness over tendon shealth. Appears to be swollen.  Right knee:  NROM.  Strength 5/5.  No tenderness over joint space.  Some supralateral tenderness to palpation.   Right wrist:  phalens positive with numbness in thumb, index and middle finger.  Negative tinels.  Strength good.  No swelling.  Negative finklestien.   Neurological: She is alert and oriented to person, place, and time.  Skin:  Small patch of erythematous  scaly skin. No vesicles, drainage, crusting in corner of left mouth.   Psychiatric: She has a normal mood and affect. Her behavior is normal.          Assessment & Plan:  [name] was seen today for hypertension and prediabetes.  Diagnoses and all orders for this visit:  Controlled type 2 diabetes mellitus without complication, without long-term current use of insulin (HCC) -     POCT HgB A1C -     Dapagliflozin-Metformin HCl ER (XIGDUO XR) 05-999 MG  TB24; Take 1 tablet daily by mouth.  Influenza vaccine needed -     Flu Vaccine QUAD 6+ mos PF IM (Fluarix Quad PF)  Acute pain of right knee -     DG Knee 1-2 Views Left; Future -     DG Knee Complete 4 Views Right; Future -     DG Knee Complete 4 Views Right -     DG Knee 1-2 Views Left  Trigger finger, left ring finger  Carpal tunnel syndrome, right  Dry skin dermatitis -     triamcinolone cream (KENALOG) 0.1 %; Apply 1 application 2 (two) times daily topically.    Lab Results  Component Value Date   HGBA1C 6.7 06/24/2017   Added to metformin with xigduo. Discussed side effects.  Discussed DM diet.  On ACE/ASA/Statin. Follow up in 3 months.   Will get xrays of knees. Likely OA. Discussed treatment plan with weight loss, NSAIDS, and knee injections. Will confirm plan with xrays. I believe she could have some IT band pain as well. Given exercises to help with this.   Trigger finger try rest, splint(get online), NSAIDs. Consider sports medicine with injection if no improvement. HO given.   Right wrist symptoms sound like carpal tunnel. Get wrist splint, NSAIDs, icing, rest. Consider hydrodissection of medial nerve vs EMG's and surgery if no improvement.   I believe patch on skin is dry skin dermatitis. Use triamincinolone sparingly over small area.

## 2017-06-25 ENCOUNTER — Encounter: Payer: Self-pay | Admitting: Physician Assistant

## 2017-06-25 DIAGNOSIS — M170 Bilateral primary osteoarthritis of knee: Secondary | ICD-10-CM | POA: Insufficient documentation

## 2017-06-25 NOTE — Progress Notes (Signed)
Call pt: lots of arthritis in both knees with small loose body of bone fragments seen. I think you would really benefit from considering orthovisc or steroid shot. In meantime NSAIds can be very effective for arthritic knee pain. Some people get relief from glucosamine chondrotin.

## 2017-06-26 DIAGNOSIS — M65342 Trigger finger, left ring finger: Secondary | ICD-10-CM | POA: Insufficient documentation

## 2017-06-26 DIAGNOSIS — L853 Xerosis cutis: Secondary | ICD-10-CM | POA: Insufficient documentation

## 2017-06-26 DIAGNOSIS — M25561 Pain in right knee: Secondary | ICD-10-CM

## 2017-06-26 DIAGNOSIS — G8929 Other chronic pain: Secondary | ICD-10-CM | POA: Insufficient documentation

## 2017-06-26 DIAGNOSIS — G5601 Carpal tunnel syndrome, right upper limb: Secondary | ICD-10-CM | POA: Insufficient documentation

## 2017-06-26 DIAGNOSIS — M25562 Pain in left knee: Secondary | ICD-10-CM

## 2017-06-26 DIAGNOSIS — M170 Bilateral primary osteoarthritis of knee: Secondary | ICD-10-CM | POA: Insufficient documentation

## 2017-06-26 DIAGNOSIS — E119 Type 2 diabetes mellitus without complications: Secondary | ICD-10-CM | POA: Insufficient documentation

## 2017-06-27 ENCOUNTER — Encounter: Payer: Self-pay | Admitting: Family Medicine

## 2017-06-27 ENCOUNTER — Ambulatory Visit: Payer: Medicare HMO | Admitting: Family Medicine

## 2017-06-27 VITALS — BP 136/72 | HR 82 | Wt 175.0 lb

## 2017-06-27 DIAGNOSIS — M7061 Trochanteric bursitis, right hip: Secondary | ICD-10-CM | POA: Diagnosis not present

## 2017-06-27 DIAGNOSIS — M65342 Trigger finger, left ring finger: Secondary | ICD-10-CM | POA: Diagnosis not present

## 2017-06-27 DIAGNOSIS — M170 Bilateral primary osteoarthritis of knee: Secondary | ICD-10-CM | POA: Diagnosis not present

## 2017-06-27 DIAGNOSIS — M25561 Pain in right knee: Secondary | ICD-10-CM

## 2017-06-27 DIAGNOSIS — M7060 Trochanteric bursitis, unspecified hip: Secondary | ICD-10-CM | POA: Insufficient documentation

## 2017-06-27 MED ORDER — DICLOFENAC SODIUM 1 % TD GEL: 4.0000 g | Freq: Four times a day (QID) | TRANSDERMAL | 11 refills | Status: DC

## 2017-06-27 NOTE — Progress Notes (Signed)
Ann Griffith is a 66 y.o. female who presents to [facility] today for trigger finger and knee arthritis.   Left Trigger Finger: Palma notes pain, trigger and clicking of her left 4th digit since June of 2018 (5 months). She cannot recall any injury but does note the pain started after she moved house.  She has tried some ibuprofen which has not worked much at all.  She has not tried much other treatment.  She denies any fevers or chills nausea vomiting or diarrhea.  Additionally she notes knee pain right worse than left.  She has pain in the knee and along the lateral leg.  The pain is worse with activity and better with rest.  She denies any radiating pain weakness or numbness.   Past Medical History:  Diagnosis Date  . CHOLELITHIASIS, HX OF 11/09/2007   Qualifier: Diagnosis of  By: [name] Mcalpine    . DIABETES MELLITUS, TYPE II 11/09/2007   Qualifier: Diagnosis of  By: [name] Mcalpine    . Diverticulosis   . Hyperlipidemia    Past Surgical History:  Procedure Laterality Date  . bone spur removed  2003   left heel  . CATARACT EXTRACTION Left 02/2016  . CHOLECYSTECTOMY    . COLONOSCOPY  08/2003   Social History   Tobacco Use  . Smoking status: Never Smoker  . Smokeless tobacco: Never Used  Substance Use Topics  . Alcohol use: Yes    Alcohol/week: 0.5 oz    Types: 1 drink(s) per week     ROS:  As above   Medications: Current Outpatient Medications  Medication Sig Dispense Refill  . AMBULATORY NON FORMULARY MEDICATION Test strips and lancets.  Testing once a day.   Dx: 250.00 100 strip 1  . amLODipine (NORVASC) 2.5 MG tablet TAKE 1-2 TABLET (2.5 MG TOTAL) BY MOUTH DAILY. 180 tablet 2  . aspirin 81 MG tablet Take 81 mg by mouth daily.    . cholecalciferol (VITAMIN D) 1000 UNITS tablet Take 1,000 Units by mouth 3 (three) times daily.     . Cyanocobalamin (VITAMIN B-12 PO) Take 5,000 mcg by mouth.    .  Eflornithine HCl 13.9 % cream Apply 1 application topically 2 (two) times daily. 45 g 5  . fish oil-omega-3 fatty acids 1000 MG capsule Take 1 g by mouth daily.    [name] ibuprofen (ADVIL,MOTRIN) 800 MG tablet TAKE 1 TABLET (800 MG TOTAL) BY MOUTH EVERY 8 (EIGHT) HOURS AS NEEDED. 60 tablet 2  . lisinopril (PRINIVIL,ZESTRIL) 2.5 MG tablet TAKE 1 TABLET (2.5 MG TOTAL) BY MOUTH DAILY. DUE FOR FOLLOW UP VISIT 15 tablet 0  . metFORMIN (GLUCOPHAGE) 500 MG tablet Take 2 tabs twice a day with meals. 360 tablet 2  . Misc. Devices MISC One touch Ultra test strips 50/package  To test once a day for DM type II controlled. 50 each 1  . rOPINIRole (REQUIP) 0.25 MG tablet TAKE 1-4 TABLETS BY MOUTH AT BEDTIME 90 tablet 5  . simvastatin (ZOCOR) 40 MG tablet Take 1 tablet (40 mg total) by mouth daily. 30 tablet 5  . triamcinolone cream (KENALOG) 0.1 % Apply 1 application 2 (two) times daily topically. 30 g 0  . valACYclovir (VALTREX) 500 MG tablet TAKE 2 TABLETS TWICE A DAY FOR 1 DAY AT ONSET OF SYMPTOMS. 32 tablet 1  . Dapagliflozin-Metformin HCl ER (XIGDUO XR) 05-999 MG TB24 Take 1 tablet daily by mouth. (Patient not taking: Reported on 06/27/2017)  90 tablet 0  . diclofenac sodium (VOLTAREN) 1 % GEL Apply 4 g 4 (four) times daily topically. To affected joint. 100 g 11   No current facility-administered medications for this visit.    Allergies  Allergen Reactions  . Amoxicillin Swelling  . Atorvastatin Other (See Comments)    Memory loss     Exam:  BP 136/72   Pulse 82   Wt 175 lb (79.4 kg)   LMP 08/12/2000   BMI 28.68 kg/m  General: Well Developed, well nourished, and in no acute distress.  Neuro/Psych: Alert and oriented x3, extra-ocular muscles intact, able to move all 4 extremities, sensation grossly intact. Skin: Warm and dry, no rashes noted.  Respiratory: Not using accessory muscles, speaking in full sentences, trachea midline.  Cardiovascular: Pulses palpable, no extremity edema. Abdomen:  Does not appear distended. MSK:  Left hand normal-appearing Mildly tender to palpation fourth MCP with no palpable nodules. Triggering present at flexion of fourth PIP.  Right hip normal-appearing tender to palpation greater trochanter hip abduction strength is diminished 4/5   Procedure: Real-time Ultrasound Guided Injection of Left 4th MCP A1 Pulley  Device: GE Logiq E  Images permanently stored and available for review in the ultrasound unit. Verbal informed consent obtained. Discussed risks and benefits of procedure. Warned about infection bleeding damage to structures skin hypopigmentation and fat atrophy among others. Patient expresses understanding and agreement Time-out conducted.  Noted no overlying erythema, induration, or other signs of local infection.  Skin prepped in a sterile fashion.  Local anesthesia: Topical Ethyl chloride.  With sterile technique and under real time ultrasound guidance: 0.32ml lidocaine and 5mg  dexamethasone injected easily.  Completed without difficulty   Advised to call if fevers/chills, erythema, induration, drainage, or persistent bleeding.  Images permanently stored and available for review in the ultrasound unit.  Impression: Technically successful ultrasound guided injection.     No results found for this or any previous visit (from the past 48 hour(s)). Dg Knee 1-2 Views Left  Result Date: 06/24/2017 CLINICAL DATA:  Pain above patella. EXAM: LEFT KNEE - 1-2 VIEW COMPARISON:  No recent . FINDINGS: No acute bony or joint abnormality identified. No evidence of fracture dislocation. Tricompartment degenerative change with loose bodies noted. IMPRESSION: Tricompartment degenerative change with loose bodies. Electronically Signed   By: Marcello Moores  Register   On: 06/24/2017 10:09   Dg Knee Complete 4 Views Right  Result Date: 06/24/2017 CLINICAL DATA:  Knee pain.  Injury years ago. EXAM: RIGHT KNEE - COMPLETE 4+ VIEW COMPARISON:   08/09/2013. FINDINGS: Tricompartment degenerative change with small loose bodies noted. No acute bony or joint abnormality identified. No evidence of fracture or dislocation. IMPRESSION: Tricompartment degenerative change with small loose bodies. No acute bony abnormality . Electronically Signed   By: Marcello Moores  Register   On: 06/24/2017 09:59      Assessment and Plan: 66 y.o. female with left fourth trigger finger.  Injected today and will treat with a double Band-Aid splint.  We had an extensive discussion about options.  Recheck as needed if worsening or not improving.  Next step would either be repeat injection or referral to hand surgery.  Lateral hip and leg pain likely is greater trochanteric bursitis.  We discussed stretching and home strengthening exercises.  Recheck as needed.    No orders of the defined types were placed in this encounter.  Meds ordered this encounter  Medications  . diclofenac sodium (VOLTAREN) 1 % GEL    Sig: Apply 4  g 4 (four) times daily topically. To affected joint.    Dispense:  100 g    Refill:  11    Discussed warning signs or symptoms. Please see discharge instructions. Patient expresses understanding.

## 2017-06-27 NOTE — Patient Instructions (Signed)
Thank you for coming in today. Use the side leg raise 30 reps up to 2-3x daily for hip and knee pain.  Use diclofenac gel up to 4x daily for knee pain.  Call or go to the ER if you develop a large red swollen joint with extreme pain or oozing puss.  Use the double baindaid splint to prevent triggering for a few weeks.  Recheck as needed.  ms and a physical exam.  Trochanteric Bursitis Trochanteric bursitis is a condition that causes hip pain. Trochanteric bursitis happens when fluid-filled sacs (bursae) in the hip get irritated. Normally these sacs absorb shock and help strong bands of tissue (tendons) in your hip glide smoothly over each other and over your hip bones. What are the causes? This condition results from increased friction between the hip bones and the tendons that go over them. This condition can happen if you:  Have weak hips.  Use your hip muscles too much (overuse).  Get hit in the hip.  What increases the risk? This condition is more likely to develop in:  Women.  Adults who are middle-aged or older.  People with arthritis or a spinal condition.  People with weak buttocks muscles (gluteal muscles).  People who have one leg that is shorter than the other.  People who participate in certain kinds of athletic activities, such as: ? Running sports, especially long-distance running. ? Contact sports, like football or martial arts. ? Sports in which falls may occur, like skiing.  What are the signs or symptoms? The main symptom of this condition is pain and tenderness over the point of your hip. The pain may be:  Sharp and intense.  Dull and achy.  Felt on the outside of your thigh.  It may increase when you:  Lie on your side.  Walk or run.  Go up on stairs.  Sit.  Stand up after sitting.  Stand for long periods of time.  How is this diagnosed? This condition may be diagnosed based on:  Your symptoms.  Your medical history.  A physical  exam.  Imaging tests, such as: ? X-rays to check your bones. ? An MRI or ultrasound to check your tendons and muscles.  During your physical exam, your health care provider will check the movement and strength of your hip. He or she may press on the point of your hip to check for pain. How is this treated? This condition may be treated by:  Resting.  Reducing your activity.  Avoiding activities that cause pain.  Using crutches, a cane, or a walker to decrease the strain on your hip.  Taking medicine to help with swelling.  Having medicine injected into the bursae to help with swelling.  Using ice, heat, and massage therapy for pain relief.  Physical therapy exercises for strength and flexibility.  Surgery (rare).  Follow these instructions at home: Activity  Rest.  Avoid activities that cause pain.  Return to your normal activities as told by your health care provider. Ask your health care provider what activities are safe for you. Managing pain, stiffness, and swelling  Take over-the-counter and prescription medicines only as told by your health care provider.  If directed, apply heat to the injured area as told by your health care provider. ? Place a towel between your skin and the heat source. ? Leave the heat on for 20-30 minutes. ? Remove the heat if your skin turns bright red. This is especially important if you are unable to  feel pain, heat, or cold. You may have a greater risk of getting burned.  If directed, apply ice to the injured area: ? Put ice in a plastic bag. ? Place a towel between your skin and the bag. ? Leave the ice on for 20 minutes, 2-3 times a day. General instructions  If the affected leg is one that you use for driving, ask your health care provider when it is safe to drive.  Use crutches, a cane, or a walker as told by your health care provider.  If one of your legs is shorter than the other, get fitted for a shoe insert.  Lose  weight if you are overweight. How is this prevented?  Wear supportive footwear that is appropriate for your sport.  If you have hip pain, start any new exercise or sport slowly.  Maintain physical fitness, including: ? Strength. ? Flexibility. Contact a health care provider if:  Your pain does not improve with 2-4 weeks. Get help right away if:  You develop severe pain.  You have a fever.  You develop increased redness over your hip.  You have a change in your bowel function or bladder function.  You cannot control the muscles in your feet. This information is not intended to replace advice given to you by your health care provider. Make sure you discuss any questions you have with your health care provider. Document Released: 09/05/2004 Document Revised: 04/03/2016 Document Reviewed: 07/14/2015 Elsevier Interactive Patient Education  [employer].

## 2017-07-04 ENCOUNTER — Other Ambulatory Visit: Payer: Self-pay | Admitting: Physician Assistant

## 2017-07-04 DIAGNOSIS — E118 Type 2 diabetes mellitus with unspecified complications: Secondary | ICD-10-CM

## 2017-09-15 ENCOUNTER — Ambulatory Visit (INDEPENDENT_AMBULATORY_CARE_PROVIDER_SITE_OTHER): Payer: Medicare HMO | Admitting: Family Medicine

## 2017-09-15 ENCOUNTER — Encounter: Payer: Self-pay | Admitting: Family Medicine

## 2017-09-15 VITALS — BP 128/76 | HR 93 | Ht 66.0 in | Wt 178.0 lb

## 2017-09-15 DIAGNOSIS — M65342 Trigger finger, left ring finger: Secondary | ICD-10-CM | POA: Diagnosis not present

## 2017-09-15 DIAGNOSIS — G5601 Carpal tunnel syndrome, right upper limb: Secondary | ICD-10-CM | POA: Diagnosis not present

## 2017-09-15 NOTE — Patient Instructions (Signed)
Thank you for coming in today. Keep the ring off for a while or get it resized larger.  Attend Hand PT.  Recheck in 6-8 weeks.  Continue the right wrist brace especially at night as needed.  Recheck sooner if needed.  If you want to go ahead and see hand surgery I recommend Dr Amedeo Plenty.

## 2017-09-15 NOTE — Progress Notes (Signed)
Ann Griffith is a 67 y.o. female who presents to [facility] today for left trigger finger and right carpal tunnel.  Left trigger finger: Patient with trigger finger of 4th finger on left hand. Patient had injection in November, with moderate improvement. Patient was significantly stiff at PIP at that point and endorsing pain at MCP and along sides of 4th finger. Today patient still endorsing symptoms. Patient's stiffness improved significantly with injection, but still endorsing some pain and stiffness today. Patient also endorsing burning pain along lateral surfaces of 4th finger.  Right Carpal Tunnel: patient with right carpal tunnel. Endorsing numbness and tingling along palmar aspect of thumb. Minimal numbness and tingling of 2nd and 3rd digits. Patient denies weakness. Patient utilizes brace 75% of the time. Wears most nights and also frequently during the day. Bracing does help. Currently her symptoms are tolerable.    Past Medical History:  Diagnosis Date  . CHOLELITHIASIS, HX OF 11/09/2007   Qualifier: Diagnosis of  By: [name] Mcalpine    . DIABETES MELLITUS, TYPE II 11/09/2007   Qualifier: Diagnosis of  By: [name] Mcalpine    . Diverticulosis   . Hyperlipidemia    Past Surgical History:  Procedure Laterality Date  . bone spur removed  2003   left heel  . CATARACT EXTRACTION Left 02/2016  . CHOLECYSTECTOMY    . COLONOSCOPY  08/2003   Social History   Tobacco Use  . Smoking status: Never Smoker  . Smokeless tobacco: Never Used  Substance Use Topics  . Alcohol use: Yes    Alcohol/week: 0.5 oz    Types: 1 drink(s) per week     ROS:  As above   Medications: Current Outpatient Medications  Medication Sig Dispense Refill  . AMBULATORY NON FORMULARY MEDICATION Test strips and lancets.  Testing once a day.   Dx: 250.00 100 strip 1  . amLODipine (NORVASC) 2.5 MG tablet TAKE 1-2 TABLET (2.5 MG TOTAL) BY MOUTH  DAILY. 180 tablet 2  . aspirin 81 MG tablet Take 81 mg by mouth daily.    . cholecalciferol (VITAMIN D) 1000 UNITS tablet Take 1,000 Units by mouth 3 (three) times daily.     . Cyanocobalamin (VITAMIN B-12 PO) Take 5,000 mcg by mouth.    . Dapagliflozin-Metformin HCl ER (XIGDUO XR) 05-999 MG TB24 Take 1 tablet daily by mouth. 90 tablet 0  . diclofenac sodium (VOLTAREN) 1 % GEL Apply 4 g 4 (four) times daily topically. To affected joint. 100 g 11  . Eflornithine HCl 13.9 % cream Apply 1 application topically 2 (two) times daily. 45 g 5  . fish oil-omega-3 fatty acids 1000 MG capsule Take 1 g by mouth daily.    [name] ibuprofen (ADVIL,MOTRIN) 800 MG tablet TAKE 1 TABLET (800 MG TOTAL) BY MOUTH EVERY 8 (EIGHT) HOURS AS NEEDED. 60 tablet 2  . lisinopril (PRINIVIL,ZESTRIL) 2.5 MG tablet Take 1 tablet (2.5 mg total) by mouth daily. 30 tablet 5  . metFORMIN (GLUCOPHAGE) 500 MG tablet Take 2 tabs twice a day with meals. 360 tablet 2  . Misc. Devices MISC One touch Ultra test strips 50/package  To test once a day for DM type II controlled. 50 each 1  . rOPINIRole (REQUIP) 0.25 MG tablet TAKE 1-4 TABLETS BY MOUTH AT BEDTIME 90 tablet 5  . simvastatin (ZOCOR) 40 MG tablet Take 1 tablet (40 mg total) by mouth daily. 30 tablet 5  . triamcinolone cream (KENALOG) 0.1 % Apply 1  application 2 (two) times daily topically. 30 g 0  . valACYclovir (VALTREX) 500 MG tablet TAKE 2 TABLETS TWICE A DAY FOR 1 DAY AT ONSET OF SYMPTOMS. 32 tablet 1   No current facility-administered medications for this visit.    Allergies  Allergen Reactions  . Amoxicillin Swelling  . Atorvastatin Other (See Comments)    Memory loss     Exam:  BP 128/76   Pulse 93   Ht 5\' 6"  (1.676 m)   Wt 178 lb (80.7 kg)   LMP 08/12/2000   BMI 28.73 kg/m  General: Well Developed, well nourished, and in no acute distress.  Neuro/Psych: Alert and oriented x3, extra-ocular muscles intact, able to move all 4 extremities, sensation grossly  intact. Skin: Warm and dry, no rashes noted.  Respiratory: Not using accessory muscles, speaking in full sentences, trachea midline.  Cardiovascular: Pulses palpable, no extremity edema. Abdomen: Does not appear distended. MSK:  L Hand: 4th finger normal in appearance, but with some swelling around ring.  Decreased active and passive ROM at PIP. No triggering present at 4th PIP.  Right hand: Normal in appearance. Full ROM throughout. Tinel and Phalen test negative.   Ring Removal: Left 4th digit ring removal.  Dental floss was wound around the finger distal to the ring compressing the finger. The floss on the proximal side of the ring was unwound pulling the ring over the PIP and off the finger without injury.   No results found for this or any previous visit (from the past 48 hour(s)). No results found.    Assessment and Plan: 67 y.o. female with left 4th trigger finger and mild carpal tunnel of right hand.  Left trigger finger: Injection was moderately helpful in November. Patient primarily with stiffness of MCP and PIP. No true triggering on exam at this point. I doubt that repeat injection or surgery would be helpful at this point. Instead, patient should attend formal hand therapy to improve ROM, stiffness, and pain. Ring was removed from left 4th finger as it is likely contributing to burning sensation along lateral edge of finger.  Right carpal tunnel: patient will mild carpal tunnel symptoms, amenable to bracing. At this point, patient electing to continue with conservative bracing. She states that discomfort is not severe enough to warrant injection or consideration of surgery. Injection would be next step if worsening.   Orders Placed This Encounter  Procedures  . Ambulatory referral to Occupational Therapy    Referral Priority:   Routine    Referral Type:   Occupational Therapy    Referral Reason:   Specialty Services Required    Requested Specialty:   Occupational  Therapy    Number of Visits Requested:   1   No orders of the defined types were placed in this encounter.   Discussed warning signs or symptoms. Please see discharge instructions. Patient expresses understanding.  I spent 25 minutes with this patient, greater than 50% was face-to-face time counseling regarding ddx and treatment plan.

## 2017-09-24 ENCOUNTER — Encounter: Payer: Self-pay | Admitting: Physician Assistant

## 2017-09-24 ENCOUNTER — Ambulatory Visit: Payer: Medicare HMO | Admitting: Physician Assistant

## 2017-09-24 VITALS — BP 137/65 | HR 78 | Ht 66.0 in | Wt 175.0 lb

## 2017-09-24 DIAGNOSIS — F40243 Fear of flying: Secondary | ICD-10-CM

## 2017-09-24 DIAGNOSIS — G2581 Restless legs syndrome: Secondary | ICD-10-CM | POA: Diagnosis not present

## 2017-09-24 DIAGNOSIS — M65342 Trigger finger, left ring finger: Secondary | ICD-10-CM | POA: Diagnosis not present

## 2017-09-24 DIAGNOSIS — Z7189 Other specified counseling: Secondary | ICD-10-CM

## 2017-09-24 DIAGNOSIS — R143 Flatulence: Secondary | ICD-10-CM

## 2017-09-24 DIAGNOSIS — E118 Type 2 diabetes mellitus with unspecified complications: Secondary | ICD-10-CM | POA: Diagnosis not present

## 2017-09-24 DIAGNOSIS — R69 Illness, unspecified: Secondary | ICD-10-CM | POA: Diagnosis not present

## 2017-09-24 DIAGNOSIS — Z7184 Encounter for health counseling related to travel: Secondary | ICD-10-CM

## 2017-09-24 LAB — POCT GLYCOSYLATED HEMOGLOBIN (HGB A1C): Hemoglobin A1C: 6.3

## 2017-09-24 MED ORDER — ALPRAZOLAM 0.5 MG PO TABS: 0.5000 mg | ORAL_TABLET | Freq: Every evening | ORAL | 0 refills | Status: DC | PRN

## 2017-09-24 NOTE — Progress Notes (Signed)
Subjective:    Patient ID: Ann Griffith, female    DOB: 05-13-1951, 67 y.o.   MRN: [id number]  HPI  Pt is a 67 yo female who presents to the clinic for 3 month follow up.   DM- pt continues to wait for insurance to approve xigduo. She is only on metformin at this time. She denies any hypoglycemia. No open sores or wounds. She is not checking sugars at home.   RLS-controlled with requip.   She is seeing Dr. Georgina Griffith for her trigger finger. She has had injection. She will start hand therapy and if no improvement may consider surgery.   She does mention excessive gas. Over the last 6-8 months she is passing gas multiple times a day. She has normal bowel movements every other day. She declines them being hard or straining involved. She eats a lot of fruit including prunes. Denies any abdominal pain. She does feeling like gas improves some after a good bowel movement. She denies any fecal incontinence.   Pt mentions an upcoming trip to [place] and they will flying which makes her nervous. She request something to use for travel.  .. Active Ambulatory Problems    Diagnosis Date Noted  . Diabetes mellitus, type II ([place]) 11/09/2007  . 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 CHOLELITHIASIS, HX OF 11/09/2007  . Diverticulosis 06/07/2013  . Raynaud's syndrome 08/09/2013  . Benign paroxysmal positional vertigo 11/02/2014  . Microalbuminuria 11/02/2014  . Hyperlipidemia 11/03/2014  . Seborrheic dermatitis 03/14/2015  . Memory changes 05/05/2015  . Insomnia 08/08/2015  . Right leg pain 08/08/2015  . Shoulder bursitis 08/08/2015  . Avitaminosis D 10/09/2012  . Colon polyp 04/24/2011  . Tennis elbow syndrome 03/29/2016  . RLS (restless legs syndrome) 03/29/2016  . Right shoulder pain 03/29/2016  . 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 Cataract cortical, senile, left 06/04/2016  . Elevated serum creatinine 12/10/2016  . 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 Trigger finger, left ring finger 06/26/2017  . Acute pain of right knee 06/26/2017  . Controlled type 2 diabetes mellitus without complication, without long-term current use of insulin ([place]) 06/26/2017  . Carpal tunnel syndrome, right 06/26/2017  . Dry skin dermatitis 06/26/2017  . Trochanteric bursitis 06/27/2017  . Flatulence 09/28/2017  . 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 Eustachian tube dysfunction 05/02/2014   Past Medical History:  Diagnosis Date  . CHOLELITHIASIS, HX OF 11/09/2007  . DIABETES MELLITUS, TYPE II 11/09/2007  . Diverticulosis   . Hyperlipidemia      Review of Systems  All other systems reviewed and are negative.      Objective:   Physical Exam  Constitutional: She appears well-developed and well-nourished.  HENT:  Head: Normocephalic and atraumatic.  Cardiovascular: Normal rate, regular rhythm and normal heart sounds.  Pulmonary/Chest: Effort normal and breath sounds normal.  Abdominal: Soft. Bowel sounds are normal. She exhibits no distension and no mass. There is no tenderness. There is no rebound and no guarding.  Musculoskeletal:  Trigger finger ring of left hand. Still gets hung. No pain.   Psychiatric: She has a normal mood and affect. Her behavior is normal.          Assessment & Plan:  [name] was seen today for diabetes.  Diagnoses and all orders for this visit:  Type 2 diabetes mellitus with complication, without long-term current use of insulin ([facility]) -     POCT  HgB A1C -     COMPLETE METABOLIC PANEL WITH GFR  Travel advice encounter -     ALPRAZolam (XANAX) 0.5 MG tablet; Take 1 tablet (0.5 mg total) by mouth at bedtime as needed for anxiety.  Fear of flying -     ALPRAZolam (XANAX) 0.5 MG tablet; Take 1 tablet (0.5 mg total) by mouth at bedtime as needed for anxiety.  Trigger finger, left ring finger  RLS (restless legs syndrome)  Flatulence   .[name] Lab Results   Component Value Date   HGBA1C 6.3 09/24/2017   A!C is great. Stay on metformin.  BP great.  On statin.  On ASA. Discussed need for eye exam.   Continue to follow up with sports management for trigger finger.   RLS- continue requip.   Fear of flying-given 10 xanax for travel. Discussed sedation side effects.   Flatulence- keep bowel movements regular. Start probiotic. Gas X as needed. Keep food diary for triggers most common triggers are dairy and gluten. Follow up as needed.   [name].Spent 30 minutes with patient and greater than 50 percent of visit spent counseling patient regarding treatment plan.

## 2017-09-24 NOTE — Patient Instructions (Addendum)
Probiotics daily(align/culturelle) Gas-X as needed.   Diet for Irritable Bowel Syndrome When you have irritable bowel syndrome (IBS), the foods you eat and your eating habits are very important. IBS may cause various symptoms, such as abdominal pain, constipation, or diarrhea. Choosing the right foods can help ease discomfort caused by these symptoms. Work with your health care provider and dietitian to find the best eating plan to help control your symptoms. What general guidelines do I need to follow?  Keep a food diary. This will help you identify foods that cause symptoms. Write down: ? What you eat and when. ? What symptoms you have. ? When symptoms occur in relation to your meals.  Avoid foods that cause symptoms. Talk with your dietitian about other ways to get the same nutrients that are in these foods.  Eat more foods that contain fiber. Take a fiber supplement if directed by your dietitian.  Eat your meals slowly, in a relaxed setting.  Aim to eat 5-6 small meals per day. Do not skip meals.  Drink enough fluids to keep your urine clear or pale yellow.  Ask your health care provider if you should take an over-the-counter probiotic during flare-ups to help restore healthy gut bacteria.  If you have cramping or diarrhea, try making your meals low in fat and high in carbohydrates. Examples of carbohydrates are pasta, rice, whole grain breads and cereals, fruits, and vegetables.  If dairy products cause your symptoms to flare up, try eating less of them. You might be able to handle yogurt better than other dairy products because it contains bacteria that help with digestion. What foods are not recommended? The following are some foods and drinks that may worsen your symptoms:  Fatty foods, such as [place] fries.  Milk products, such as cheese or ice cream.  Chocolate.  Alcohol.  Products with caffeine, such as coffee.  Carbonated drinks, such as soda.  The items listed  above may not be a complete list of foods and beverages to avoid. Contact your dietitian for more information. What foods are good sources of fiber? Your health care provider or dietitian may recommend that you eat more foods that contain fiber. Fiber can help reduce constipation and other IBS symptoms. Add foods with fiber to your diet a little at a time so that your body can get used to them. Too much fiber at once might cause gas and swelling of your abdomen. The following are some foods that are good sources of fiber:  Apples.  Peaches.  Pears.  Berries.  Figs.  Broccoli (raw).  Cabbage.  Carrots.  Raw peas.  Kidney beans.  Lima beans.  Whole grain bread.  Whole grain cereal.  Where to find more information: [employer] for Functional Gastrointestinal Disorders: www.iffgd.[employer] of Diabetes and Digestive and Kidney Diseases: NetworkAffair.co.za.aspx This information is not intended to replace advice given to you by your health care provider. Make sure you discuss any questions you have with your health care provider. Document Released: 10/19/2003 Document Revised: 01/04/2016 Document Reviewed: 10/29/2013 Elsevier Interactive Patient Education  2018 [employer].

## 2017-09-25 LAB — COMPLETE METABOLIC PANEL WITH GFR
AG Ratio: 1.4 (calc) (ref 1.0–2.5)
ALKALINE PHOSPHATASE (APISO): 50 U/L (ref 33–130)
ALT: 21 U/L (ref 6–29)
AST: 19 U/L (ref 10–35)
Albumin: 4 g/dL (ref 3.6–5.1)
BUN / CREAT RATIO: 12 (calc) (ref 6–22)
BUN: 14 mg/dL (ref 7–25)
CALCIUM: 9.4 mg/dL (ref 8.6–10.4)
CHLORIDE: 104 mmol/L (ref 98–110)
CO2: 24 mmol/L (ref 20–32)
Creat: 1.14 mg/dL — ABNORMAL HIGH (ref 0.50–0.99)
GFR, EST AFRICAN AMERICAN: 58 mL/min/{1.73_m2} — AB (ref >=60)
GFR, Est Non African American: 50 mL/min/{1.73_m2} — ABNORMAL LOW (ref >=60)
GLUCOSE: 175 mg/dL — AB (ref 65–99)
Globulin: 2.9 g/dL (calc) (ref 1.9–3.7)
POTASSIUM: 4.2 mmol/L (ref 3.5–5.3)
SODIUM: 136 mmol/L (ref 135–146)
Total Bilirubin: 0.3 mg/dL (ref 0.2–1.2)
Total Protein: 6.9 g/dL (ref 6.1–8.1)

## 2017-09-25 NOTE — Progress Notes (Signed)
Call pt: glucose up but known diabetic and not fasting.   Serum creatine went up a little. Nothing concerning but still CKD 3. Stay away from any NSAIDs. We will continue to monitor.

## 2017-09-28 DIAGNOSIS — F40243 Fear of flying: Secondary | ICD-10-CM | POA: Insufficient documentation

## 2017-09-28 DIAGNOSIS — R143 Flatulence: Secondary | ICD-10-CM | POA: Insufficient documentation

## 2017-09-28 MED ORDER — METFORMIN HCL 500 MG PO TABS: ORAL_TABLET | ORAL | 2 refills | Status: DC

## 2017-09-30 DIAGNOSIS — M25642 Stiffness of left hand, not elsewhere classified: Secondary | ICD-10-CM | POA: Diagnosis not present

## 2017-09-30 DIAGNOSIS — E119 Type 2 diabetes mellitus without complications: Secondary | ICD-10-CM | POA: Diagnosis not present

## 2017-09-30 DIAGNOSIS — M25542 Pain in joints of left hand: Secondary | ICD-10-CM | POA: Diagnosis not present

## 2017-09-30 DIAGNOSIS — M6281 Muscle weakness (generalized): Secondary | ICD-10-CM | POA: Diagnosis not present

## 2017-10-02 DIAGNOSIS — M25542 Pain in joints of left hand: Secondary | ICD-10-CM | POA: Diagnosis not present

## 2017-10-02 DIAGNOSIS — M25642 Stiffness of left hand, not elsewhere classified: Secondary | ICD-10-CM | POA: Diagnosis not present

## 2017-10-02 DIAGNOSIS — E119 Type 2 diabetes mellitus without complications: Secondary | ICD-10-CM | POA: Diagnosis not present

## 2017-10-02 DIAGNOSIS — M6281 Muscle weakness (generalized): Secondary | ICD-10-CM | POA: Diagnosis not present

## 2017-10-09 DIAGNOSIS — E119 Type 2 diabetes mellitus without complications: Secondary | ICD-10-CM | POA: Diagnosis not present

## 2017-10-09 DIAGNOSIS — M25642 Stiffness of left hand, not elsewhere classified: Secondary | ICD-10-CM | POA: Diagnosis not present

## 2017-10-09 DIAGNOSIS — M25542 Pain in joints of left hand: Secondary | ICD-10-CM | POA: Diagnosis not present

## 2017-10-09 DIAGNOSIS — M6281 Muscle weakness (generalized): Secondary | ICD-10-CM | POA: Diagnosis not present

## 2017-10-10 ENCOUNTER — Ambulatory Visit: Payer: Medicare HMO | Admitting: Obstetrics & Gynecology

## 2017-10-23 DIAGNOSIS — M25542 Pain in joints of left hand: Secondary | ICD-10-CM | POA: Diagnosis not present

## 2017-10-23 DIAGNOSIS — M25642 Stiffness of left hand, not elsewhere classified: Secondary | ICD-10-CM | POA: Diagnosis not present

## 2017-10-23 DIAGNOSIS — M6281 Muscle weakness (generalized): Secondary | ICD-10-CM | POA: Diagnosis not present

## 2017-10-23 DIAGNOSIS — E119 Type 2 diabetes mellitus without complications: Secondary | ICD-10-CM | POA: Diagnosis not present

## 2017-10-30 DIAGNOSIS — M25542 Pain in joints of left hand: Secondary | ICD-10-CM | POA: Diagnosis not present

## 2017-10-30 DIAGNOSIS — M6281 Muscle weakness (generalized): Secondary | ICD-10-CM | POA: Diagnosis not present

## 2017-10-30 DIAGNOSIS — E119 Type 2 diabetes mellitus without complications: Secondary | ICD-10-CM | POA: Diagnosis not present

## 2017-10-30 DIAGNOSIS — M25642 Stiffness of left hand, not elsewhere classified: Secondary | ICD-10-CM | POA: Diagnosis not present

## 2017-12-08 ENCOUNTER — Other Ambulatory Visit: Payer: Self-pay

## 2017-12-08 ENCOUNTER — Encounter: Payer: Self-pay | Admitting: *Deleted

## 2017-12-08 ENCOUNTER — Emergency Department (INDEPENDENT_AMBULATORY_CARE_PROVIDER_SITE_OTHER)
Admission: EM | Admit: 2017-12-08 | Discharge: 2017-12-08 | Disposition: A | Discharge status: Other Acute Inpatient Hospital | Payer: Medicare HMO | Source: Home / Self Care

## 2017-12-08 DIAGNOSIS — R55 Syncope and collapse: Secondary | ICD-10-CM

## 2017-12-08 DIAGNOSIS — S0993XA Unspecified injury of face, initial encounter: Secondary | ICD-10-CM

## 2017-12-08 DIAGNOSIS — S0083XA Contusion of other part of head, initial encounter: Secondary | ICD-10-CM | POA: Diagnosis not present

## 2017-12-08 DIAGNOSIS — Z7984 Long term (current) use of oral hypoglycemic drugs: Secondary | ICD-10-CM | POA: Diagnosis not present

## 2017-12-08 DIAGNOSIS — S060XAA Concussion with loss of consciousness status unknown, initial encounter: Secondary | ICD-10-CM | POA: Insufficient documentation

## 2017-12-08 DIAGNOSIS — S0990XA Unspecified injury of head, initial encounter: Secondary | ICD-10-CM

## 2017-12-08 DIAGNOSIS — S060X9A Concussion with loss of consciousness of unspecified duration, initial encounter: Secondary | ICD-10-CM | POA: Insufficient documentation

## 2017-12-08 DIAGNOSIS — S199XXA Unspecified injury of neck, initial encounter: Secondary | ICD-10-CM | POA: Diagnosis not present

## 2017-12-08 DIAGNOSIS — S060X1A Concussion with loss of consciousness of 30 minutes or less, initial encounter: Secondary | ICD-10-CM | POA: Diagnosis not present

## 2017-12-08 DIAGNOSIS — S0011XA Contusion of right eyelid and periocular area, initial encounter: Secondary | ICD-10-CM | POA: Diagnosis not present

## 2017-12-08 DIAGNOSIS — I499 Cardiac arrhythmia, unspecified: Secondary | ICD-10-CM | POA: Diagnosis not present

## 2017-12-08 DIAGNOSIS — Z23 Encounter for immunization: Secondary | ICD-10-CM | POA: Diagnosis not present

## 2017-12-08 DIAGNOSIS — H8110 Benign paroxysmal vertigo, unspecified ear: Secondary | ICD-10-CM | POA: Diagnosis not present

## 2017-12-08 DIAGNOSIS — S0511XA Contusion of eyeball and orbital tissues, right eye, initial encounter: Secondary | ICD-10-CM | POA: Diagnosis not present

## 2017-12-08 DIAGNOSIS — E785 Hyperlipidemia, unspecified: Secondary | ICD-10-CM | POA: Diagnosis not present

## 2017-12-08 DIAGNOSIS — M47892 Other spondylosis, cervical region: Secondary | ICD-10-CM | POA: Diagnosis not present

## 2017-12-08 DIAGNOSIS — I10 Essential (primary) hypertension: Secondary | ICD-10-CM | POA: Diagnosis not present

## 2017-12-08 DIAGNOSIS — W1830XA Fall on same level, unspecified, initial encounter: Secondary | ICD-10-CM | POA: Diagnosis not present

## 2017-12-08 DIAGNOSIS — Z79899 Other long term (current) drug therapy: Secondary | ICD-10-CM | POA: Diagnosis not present

## 2017-12-08 DIAGNOSIS — M2578 Osteophyte, vertebrae: Secondary | ICD-10-CM | POA: Diagnosis not present

## 2017-12-08 DIAGNOSIS — E119 Type 2 diabetes mellitus without complications: Secondary | ICD-10-CM | POA: Diagnosis not present

## 2017-12-08 DIAGNOSIS — M50323 Other cervical disc degeneration at C6-C7 level: Secondary | ICD-10-CM | POA: Diagnosis not present

## 2017-12-08 LAB — POCT FASTING CBG KUC MANUAL ENTRY: POCT GLUCOSE (MANUAL ENTRY) KUC: 158 mg/dL — AB (ref 70–99)

## 2017-12-08 NOTE — ED Provider Notes (Signed)
[facility]    CSN: [id number] Arrival date & time: 12/08/17  1134     History   Chief Complaint No chief complaint on file.   HPI Alisa Stjames is a 67 y.o. female.   The history is provided by the patient. No language interpreter was used.  Fall  This is a new problem. The current episode started less than 1 hour ago. The problem occurs constantly. The problem has not changed since onset.Associated symptoms include headaches. Nothing aggravates the symptoms. Nothing relieves the symptoms. She has tried nothing for the symptoms. The treatment provided no relief.   Family reports pt hit her head.  Fall was not witness.  Pt report some dizziness for a couple of days.  Pt does not recall event.  Family member reports pt has been confused since fall  Past Medical History:  Diagnosis Date  . CHOLELITHIASIS, HX OF 11/09/2007   Qualifier: Diagnosis of  By: [name] Mcalpine    . DIABETES MELLITUS, TYPE II 11/09/2007   Qualifier: Diagnosis of  By: [name] Mcalpine    . Diverticulosis   . Hyperlipidemia     Patient Active Problem List   Diagnosis Date Noted  . Flatulence 09/28/2017  . Fear of flying 09/28/2017  . Trochanteric bursitis 06/27/2017  . Trigger finger, left ring finger 06/26/2017  . Acute pain of right knee 06/26/2017  . Controlled type 2 diabetes mellitus without complication, without long-term current use of insulin ([place]) 06/26/2017  . Carpal tunnel syndrome, right 06/26/2017  . Dry skin dermatitis 06/26/2017  . 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 Elevated serum creatinine 12/10/2016  . Cataract cortical, senile, left 06/04/2016  . 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 Tennis elbow syndrome 03/29/2016  . RLS (restless legs syndrome) 03/29/2016  . Right shoulder pain 03/29/2016  . Insomnia 08/08/2015  . Right leg pain 08/08/2015  . Shoulder bursitis 08/08/2015  . Memory changes 05/05/2015  .  Seborrheic dermatitis 03/14/2015  . Hyperlipidemia 11/03/2014  . Benign paroxysmal positional vertigo 11/02/2014  . Microalbuminuria 11/02/2014  . Raynaud's syndrome 08/09/2013  . Diverticulosis 06/07/2013  . Avitaminosis D 10/09/2012  . Colon polyp 04/24/2011  . Diabetes mellitus, type II ([place]) 11/09/2007  . 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 CHOLELITHIASIS, HX OF 11/09/2007    Past Surgical History:  Procedure Laterality Date  . bone spur removed  2003   left heel  . CATARACT EXTRACTION Left 02/2016  . CHOLECYSTECTOMY    . COLONOSCOPY  08/2003    OB History    Gravida  1   Para  1   Term      Preterm      AB      Living  1     SAB      TAB      Ectopic      Multiple      Live Births               Home Medications    Prior to Admission medications   Medication Sig Start Date End Date Taking? Authorizing Provider  ALPRAZolam Duanne Moron) 0.5 MG tablet Take 1 tablet (0.5 mg total) by mouth at bedtime as needed for anxiety. 09/24/17   Breeback, Jade L, PA-C  AMBULATORY NON FORMULARY MEDICATION Test strips and lancets.  Testing once a day.   Dx: 250.00 11/05/13   Breeback, Jade L, PA-C  amLODipine (NORVASC) 2.5 MG tablet TAKE 1-2 TABLET (2.5 MG TOTAL)  BY MOUTH DAILY. 05/12/17   Breeback, Royetta Car, PA-C  aspirin 81 MG tablet Take 81 mg by mouth daily.    [provider]  cholecalciferol (VITAMIN D) 1000 UNITS tablet Take 1,000 Units by mouth 3 (three) times daily.     [provider]  Cyanocobalamin (VITAMIN B-12 PO) Take 5,000 mcg by mouth.    [provider]  Dapagliflozin-Metformin HCl ER (XIGDUO XR) 05-999 MG TB24 Take 1 tablet daily by mouth. Patient not taking: Reported on 09/24/2017 06/24/17   Donella Stade, PA-C  diclofenac sodium (VOLTAREN) 1 % GEL Apply 4 g 4 (four) times daily topically. To affected joint. 06/27/17   Gregor Hams, MD  Eflornithine HCl 13.9 % cream Apply 1 application topically 2 (two) times daily. 10/28/16    Megan Salon, MD  fish oil-omega-3 fatty acids 1000 MG capsule Take 1 g by mouth daily.    [provider]  ibuprofen (ADVIL,MOTRIN) 800 MG tablet TAKE 1 TABLET (800 MG TOTAL) BY MOUTH EVERY 8 (EIGHT) HOURS AS NEEDED. 04/18/16   Breeback, Jade L, PA-C  lisinopril (PRINIVIL,ZESTRIL) 2.5 MG tablet Take 1 tablet (2.5 mg total) by mouth daily. 07/07/17   Breeback, Jade L, PA-C  metFORMIN (GLUCOPHAGE) 500 MG tablet Take 2 tabs twice a day with meals. 09/28/17   Breeback, Royetta Car, PA-C  Misc. Devices MISC One touch Ultra test strips 50/package  To test once a day for DM type II controlled. 06/04/16   Breeback, Jade L, PA-C  rOPINIRole (REQUIP) 0.25 MG tablet TAKE 1-4 TABLETS BY MOUTH AT BEDTIME 05/08/17   Breeback, Jade L, PA-C  simvastatin (ZOCOR) 40 MG tablet Take 1 tablet (40 mg total) by mouth daily. 06/04/16   Breeback, Jade L, PA-C  triamcinolone cream (KENALOG) 0.1 % Apply 1 application 2 (two) times daily topically. 06/24/17   Breeback, Jade L, PA-C  valACYclovir (VALTREX) 500 MG tablet TAKE 2 TABLETS TWICE A DAY FOR 1 DAY AT ONSET OF SYMPTOMS. 09/03/16   Donella Stade, PA-C    Family History Family History  Problem Relation Age of Onset  . Diabetes Mother     Social History Social History   Tobacco Use  . Smoking status: Never Smoker  . Smokeless tobacco: Never Used  Substance Use Topics  . Alcohol use: Yes    Alcohol/week: 0.5 oz    Types: 1 drink(s) per week  . Drug use: No     Allergies   Amoxicillin and Atorvastatin   Review of Systems Review of Systems  Neurological: Positive for headaches.  All other systems reviewed and are negative.    Physical Exam Triage Vital Signs ED Triage Vitals  Enc Vitals Group     BP      Pulse      Resp      Temp      Temp src      SpO2      Weight      Height      Head Circumference      Peak Flow      Pain Score      Pain Loc      Pain Edu?      Excl. in [place]?    No data found.  Updated Vital Signs LMP  08/12/2000   Visual Acuity Right Eye Distance:   Left Eye Distance:   Bilateral Distance:    Right Eye Near:   Left Eye Near:    Bilateral Near:  Physical Exam  Constitutional: She is oriented to person, place, and time. She appears well-developed and well-nourished.  HENT:  Head: Normocephalic.  Right Ear: External ear normal.  Left Ear: External ear normal.  Mouth/Throat: Oropharynx is clear and moist.  Swelling around right orbit,  Tender to palpation.    Eyes: Pupils are equal, round, and reactive to light. Conjunctivae and EOM are normal.  Neck: Normal range of motion.  Cardiovascular: Normal rate and regular rhythm.  Pulmonary/Chest: Effort normal.  Abdominal: Soft. She exhibits no distension.  Musculoskeletal: Normal range of motion.  Neurological: She is alert and oriented to person, place, and time.  Psychiatric: She has a normal mood and affect.  Nursing note and vitals reviewed.    UC Treatments / Results  Labs (all labs ordered are listed, but only abnormal results are displayed) Labs Reviewed - No data to display  EKG None Radiology No results found.  Procedures Procedures (including critical care time)  Medications Ordered in UC Medications - No data to display   Initial Impression / Assessment and Plan / UC Course  I have reviewed the triage vital signs and the nursing notes.  Pertinent labs & imaging results that were available during my care of the patient were reviewed by me and considered in my medical decision making (see chart for details).   EKG  Rate 72  Possible lvh,  No st changes   CBG 158   Final Clinical Impressions(s) / UC Diagnoses   Final diagnoses:  Syncope, unspecified syncope type  Injury of head, initial encounter  Facial injury, initial encounter    ED Discharge Orders    None     EMS called to transport.  Pt confused after head injury.   Possible syncope.   I explained to family pt will need a head ct as  well as evaluation of dizziness and cause of syncope   Controlled Substance Prescriptions [place] Controlled Substance Registry consulted? Not Applicable   Pt transferred to ED by EMS  for evaluationof head injury and syncope   Fransico Meadow, [place] 12/08/17 1157

## 2017-12-08 NOTE — ED Triage Notes (Signed)
Patient's husband reports Ann Griffith "wandered into the yard" about 10 minutes ago disoriented. She has swelling on her right brow. She is dizzy and disoriented and shivering.

## 2017-12-09 DIAGNOSIS — S060X1A Concussion with loss of consciousness of 30 minutes or less, initial encounter: Secondary | ICD-10-CM | POA: Diagnosis not present

## 2017-12-10 ENCOUNTER — Telehealth: Payer: Self-pay | Admitting: *Deleted

## 2017-12-10 DIAGNOSIS — I10 Essential (primary) hypertension: Secondary | ICD-10-CM | POA: Insufficient documentation

## 2017-12-11 MED ORDER — GENERIC EXTERNAL MEDICATION
Status: DC
Start: ? — End: 2017-12-11

## 2017-12-11 MED ORDER — AMLODIPINE BESYLATE 2.5 MG PO TABS
2.50 | ORAL_TABLET | ORAL | Status: DC
Start: 2017-12-09 — End: 2017-12-11

## 2017-12-11 MED ORDER — HYDROCODONE-ACETAMINOPHEN 10-325 MG PO TABS
1.00 | ORAL_TABLET | ORAL | Status: DC
Start: ? — End: 2017-12-11

## 2017-12-11 MED ORDER — POLYETHYLENE GLYCOL 3350 17 G PO PACK
17.00 | PACK | ORAL | Status: DC
Start: ? — End: 2017-12-11

## 2017-12-11 MED ORDER — ACETAMINOPHEN 325 MG PO TABS
650.00 | ORAL_TABLET | ORAL | Status: DC
Start: ? — End: 2017-12-11

## 2017-12-11 MED ORDER — ENOXAPARIN SODIUM 40 MG/0.4ML ~~LOC~~ SOLN
40.00 | SUBCUTANEOUS | Status: DC
Start: 2017-12-09 — End: 2017-12-11

## 2017-12-11 MED ORDER — SODIUM CHLORIDE 0.9 % IV SOLN
10.00 | INTRAVENOUS | Status: DC
Start: ? — End: 2017-12-11

## 2017-12-11 MED ORDER — IBUPROFEN 600 MG PO TABS
600.00 | ORAL_TABLET | ORAL | Status: DC
Start: ? — End: 2017-12-11

## 2017-12-11 MED ORDER — HYDROCODONE-ACETAMINOPHEN 5-325 MG PO TABS
1.00 | ORAL_TABLET | ORAL | Status: DC
Start: ? — End: 2017-12-11

## 2017-12-11 MED ORDER — LISINOPRIL 5 MG PO TABS
2.50 | ORAL_TABLET | ORAL | Status: DC
Start: 2017-12-10 — End: 2017-12-11

## 2017-12-11 MED ORDER — ALUM & MAG HYDROXIDE-SIMETH 200-200-20 MG/5ML PO SUSP
30.00 | ORAL | Status: DC
Start: ? — End: 2017-12-11

## 2017-12-11 MED ORDER — NITROGLYCERIN 0.4 MG SL SUBL
.40 | SUBLINGUAL_TABLET | SUBLINGUAL | Status: DC
Start: ? — End: 2017-12-11

## 2017-12-15 ENCOUNTER — Encounter: Payer: Self-pay | Admitting: Physician Assistant

## 2017-12-15 ENCOUNTER — Ambulatory Visit (INDEPENDENT_AMBULATORY_CARE_PROVIDER_SITE_OTHER): Payer: Medicare HMO | Admitting: Physician Assistant

## 2017-12-15 VITALS — Ht 66.0 in

## 2017-12-15 DIAGNOSIS — R55 Syncope and collapse: Secondary | ICD-10-CM

## 2017-12-15 DIAGNOSIS — R110 Nausea: Secondary | ICD-10-CM | POA: Diagnosis not present

## 2017-12-15 DIAGNOSIS — T148XXA Other injury of unspecified body region, initial encounter: Secondary | ICD-10-CM | POA: Diagnosis not present

## 2017-12-15 DIAGNOSIS — I951 Orthostatic hypotension: Secondary | ICD-10-CM

## 2017-12-15 MED ORDER — ONDANSETRON HCL 8 MG PO TABS: 8.0000 mg | ORAL_TABLET | Freq: Three times a day (TID) | ORAL | 1 refills | Status: DC | PRN

## 2017-12-15 NOTE — Progress Notes (Signed)
Subjective:    Patient ID: Ann Griffith, female    DOB: 28-Aug-1950, 67 y.o.   MRN: [id number]  HPI  Pt is a 67 yo female with DM who presents to the clinic to follow up after ED visit on 12/08/17 for syncope and collapse. Pt reports she had worked in the yard that morning and was getting ready for podiatry appt. She went to kitchen sink and felt nauseated and that is the last thing she remembered. She fell on the right side of her body and face. She woke up in a hospital bed. She has intermittent vertigo but did not have any vertigo that day. Denies any CP, palpitations, headaches, vision changes at the time of syncope. She reported drinking water and diet coke that day.  LAYING- 161/69, 67  SITTING- 156/74, 70  STANDING- 157/76, 78   Labs normal as well as CT of head, face, c-spine. EKG normal.   5 years ago she had another episode of syncope. It was in the middle of the night and she went to the bathroom. She felt nauseated and then passed out in the bathroom floor. She never went to ED.   Pt leaves for [place] one month in a week and concerned about travel.   .. Active Ambulatory Problems    Diagnosis Date Noted  . Diabetes mellitus, type II ([place]) 11/09/2007  . 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 CHOLELITHIASIS, HX OF 11/09/2007  . Diverticulosis 06/07/2013  . Raynaud's syndrome 08/09/2013  . Benign paroxysmal positional vertigo 11/02/2014  . Microalbuminuria 11/02/2014  . Hyperlipidemia 11/03/2014  . Seborrheic dermatitis 03/14/2015  . Memory changes 05/05/2015  . Insomnia 08/08/2015  . Right leg pain 08/08/2015  . Shoulder bursitis 08/08/2015  . Avitaminosis D 10/09/2012  . Colon polyp 04/24/2011  . Tennis elbow syndrome 03/29/2016  . RLS (restless legs syndrome) 03/29/2016  . Right shoulder pain 03/29/2016  . 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 Cataract cortical, senile, left 06/04/2016  . Elevated serum creatinine 12/10/2016  . Tricompartment  osteoarthritis of knees, bilateral 06/25/2017  . Trigger finger, left ring finger 06/26/2017  . Acute pain of right knee 06/26/2017  . Controlled type 2 diabetes mellitus without complication, without long-term current use of insulin ([place]) 06/26/2017  . Carpal tunnel syndrome, right 06/26/2017  . Dry skin dermatitis 06/26/2017  . Trochanteric bursitis 06/27/2017  . Flatulence 09/28/2017  . Fear of flying 09/28/2017  . Orthostatic hypotension 12/16/2017  . Syncope and collapse 12/16/2017  . Nausea 12/16/2017  . Hematoma and contusion 12/16/2017   Resolved Ambulatory Problems    Diagnosis Date Noted  . Eustachian tube dysfunction 05/02/2014   Past Medical History:  Diagnosis Date  . CHOLELITHIASIS, HX OF 11/09/2007  . DIABETES MELLITUS, TYPE II 11/09/2007  . Diverticulosis   . Hyperlipidemia                       Review of Systems  All other systems reviewed and are negative.      Objective:   Physical Exam  Constitutional: She appears well-developed and well-nourished.  HENT:  Head: Normocephalic and atraumatic.  Large green/yellow bruise with edema around right eye and down the right side of face.   Cardiovascular: Normal rate and regular rhythm.  Pulmonary/Chest: Effort normal and breath sounds normal.  Tenderness under right breast.   Psychiatric: She has a normal mood and affect. Her behavior is normal.  Assessment & Plan:  [name] KitchenMarland KitchenDiagnoses and all orders for this visit:  Syncope and collapse -     ECHOCARDIOGRAM COMPLETE; Future -     ECHOCARDIOGRAM COMPLETE -     Ambulatory referral to Neurology  Nausea -     ondansetron (ZOFRAN) 8 MG tablet; Take 1 tablet (8 mg total) by mouth every 8 (eight) hours as needed for nausea or vomiting.  Orthostatic hypotension -     ECHOCARDIOGRAM COMPLETE; Future -     ECHOCARDIOGRAM COMPLETE -     Ambulatory referral to Neurology  Hematoma and contusion   Unclear etiology of syncope. Possible dehydration or  stomach upset that induced vasovagal response.She does have large hematoma of the right eye. Discussed icing and tylenol/NSAIDs for pain. Pt did have vitals consistent with orthostatic hypotension in office today but not in ED at time of syncope. Discussed treatment and being careful with position changes. Follow up in 1 month and will recheck. I will get an echo. Does not appear to be cardiac but echo not done in ED. Will make referral to neurology.   If continues to have vertigo although does not seem like that is cause of most recent syncope consider vestibular rehab upon return.   [name].Spent 30 minutes with patient and greater than 50 percent of visit spent counseling patient regarding treatment plan.

## 2017-12-15 NOTE — Patient Instructions (Addendum)
Orthostatic Hypotension Orthostatic hypotension is a sudden drop in blood pressure that happens when you quickly change positions, such as when you get up from a seated or lying position. Blood pressure is a measurement of how strongly, or weakly, your blood is pressing against the walls of your arteries. Arteries are blood vessels that carry blood from your heart throughout your body. When blood pressure is too low, you may not get enough blood to your brain or to the rest of your organs. This can cause weakness, light-headedness, rapid heartbeat, and fainting. This can last for just a few seconds or for up to a few minutes. Orthostatic hypotension is usually not a serious problem. However, if it happens frequently or gets worse, it may be a sign of something more serious. What are the causes? This condition may be caused by:  Sudden changes in posture, such as standing up quickly after you have been sitting or lying down.  Blood loss.  Loss of body fluids (dehydration).  Heart problems.  Hormone (endocrine) problems.  Pregnancy.  Severe infection.  Lack of certain nutrients.  Severe allergic reactions (anaphylaxis).  Certain medicines, such as blood pressure medicine or medicines that make the body lose excess fluids (diuretics). Sometimes, this condition can be caused by not taking medicine as directed, such as taking too much of a certain medicine.  What increases the risk? Certain factors can make you more likely to develop orthostatic hypotension, including:  Age. Risk increases as you get older.  Conditions that affect the heart or the central nervous system.  Taking certain medicines, such as blood pressure medicine or diuretics.  Being pregnant.  What are the signs or symptoms? Symptoms of this condition may include:  Weakness.  Light-headedness.  Dizziness.  Blurred vision.  Fatigue.  Rapid heartbeat.  Fainting, in severe cases.  How is this  diagnosed? This condition is diagnosed based on:  Your medical history.  Your symptoms.  Your blood pressure measurement. Your health care provider will check your blood pressure when you are: ? Lying down. ? Sitting. ? Standing.  A blood pressure reading is recorded as two numbers, such as "120 over 80" (or 120/80). The first ("top") number is called the systolic pressure. It is a measure of the pressure in your arteries as your heart beats. The second ("bottom") number is called the diastolic pressure. It is a measure of the pressure in your arteries when your heart relaxes between beats. Blood pressure is measured in a unit called mm Hg. Healthy blood pressure for adults is 120/80. If your blood pressure is below 90/60, you may be diagnosed with hypotension. Other information or tests that may be used to diagnose orthostatic hypotension include:  Your other vital signs, such as your heart rate and temperature.  Blood tests.  Tilt table test. For this test, you will be safely secured to a table that moves you from a lying position to an upright position. Your heart rhythm and blood pressure will be monitored during the test.  How is this treated? Treatment for this condition may include:  Changing your diet. This may involve eating more salt (sodium) or drinking more water.  Taking medicines to raise your blood pressure.  Changing the dosage of certain medicines you are taking that might be lowering your blood pressure.  Wearing compression stockings. These stockings help to prevent blood clots and reduce swelling in your legs.  In some cases, you may need to go to the hospital  for:  Fluid replacement. This means you will receive fluids through an IV tube.  Blood replacement. This means you will receive donated blood through an IV tube (transfusion).  Treating an infection or heart problems, if this applies.  Monitoring. You may need to be monitored while medicines that you  are taking wear off.  Follow these instructions at home: Eating and drinking   Drink enough fluid to keep your urine clear or pale yellow.  Eat a healthy diet and follow instructions from your health care provider about eating or drinking restrictions. A healthy diet includes: ? Fresh fruits and vegetables. ? Whole grains. ? Lean meats. ? Low-fat dairy products.  Eat extra salt only as directed. Do not add extra salt to your diet unless your health care provider told you to do that.  Eat frequent, small meals.  Avoid standing up suddenly after eating. Medicines  Take over-the-counter and prescription medicines only as told by your health care provider. ? Follow instructions from your health care provider about changing the dosage of your current medicines, if this applies. ? Do not stop or adjust any of your medicines on your own. General instructions  Wear compression stockings as told by your health care provider.  Get up slowly from lying down or sitting positions. This gives your blood pressure a chance to adjust.  Avoid hot showers and excessive heat as directed by your health care provider.  Return to your normal activities as told by your health care provider. Ask your health care provider what activities are safe for you.  Do not use any products that contain nicotine or tobacco, such as cigarettes and e-cigarettes. If you need help quitting, ask your health care provider.  Keep all follow-up visits as told by your health care provider. This is important. Contact a health care provider if:  You vomit.  You have diarrhea.  You have a fever for more than 2-3 days.  You feel more thirsty than usual.  You feel weak and tired. Get help right away if:  You have chest pain.  You have a fast or irregular heartbeat.  You develop numbness in any part of your body.  You cannot move your arms or your legs.  You have trouble speaking.  You become sweaty or feel  lightheaded.  You faint.  You feel short of breath.  You have trouble staying awake.  You feel confused. This information is not intended to replace advice given to you by your health care provider. Make sure you discuss any questions you have with your health care provider. Document Released: 07/19/2002 Document Revised: 04/16/2016 Document Reviewed: 01/19/2016 Elsevier Interactive Patient Education  2018 [employer]. Syncope Syncope is when you temporarily lose consciousness. Syncope may also be called fainting or passing out. It is caused by a sudden decrease in blood flow to the brain. Even though most causes of syncope are not dangerous, syncope can be a sign of a serious medical problem. Signs that you may be about to faint include:  Feeling dizzy or light-headed.  Feeling nauseous.  Seeing all white or all black in your field of vision.  Having cold, clammy skin.  If you fainted, get medical help right away.Call your local emergency services (911 in the [place] Do not drive yourself to the hospital. Follow these instructions at home: Pay attention to any changes in your symptoms. Take these actions to help with your condition:  Have someone stay with you until you feel stable.  Do not drive, use machinery, or play sports until your health care provider says it is okay.  Keep all follow-up visits as told by your health care provider. This is important.  If you start to feel like you might faint, lie down right away and raise (elevate) your feet above the level of your heart. Breathe deeply and steadily. Wait until all of the symptoms have passed.  Drink enough fluid to keep your urine clear or pale yellow.  If you are taking blood pressure or heart medicine, get up slowly and take several minutes to sit and then stand. This can reduce dizziness.  Take over-the-counter and prescription medicines only as told by your health care provider.  Get help right away if:  You  have a severe headache.  You have unusual pain in your chest, abdomen, or back.  You are bleeding from your mouth or rectum, or you have black or tarry stool.  You have a very fast or irregular heartbeat (palpitations).  You have pain with breathing.  You faint once or repeatedly.  You have a seizure.  You are confused.  You have trouble walking.  You have severe weakness.  You have vision problems. These symptoms may represent a serious problem that is an emergency. Do not wait to see if your symptoms will go away. Get medical help right away. Call your local emergency services (911 in the [place] Do not drive yourself to the hospital. This information is not intended to replace advice given to you by your health care provider. Make sure you discuss any questions you have with your health care provider. Document Released: 07/29/2005 Document Revised: 01/04/2016 Document Reviewed: 04/12/2015 Elsevier Interactive Patient Education  [employer].

## 2017-12-16 ENCOUNTER — Encounter: Payer: Self-pay | Admitting: Physician Assistant

## 2017-12-16 ENCOUNTER — Other Ambulatory Visit: Payer: Self-pay | Admitting: Physician Assistant

## 2017-12-16 DIAGNOSIS — I951 Orthostatic hypotension: Secondary | ICD-10-CM | POA: Insufficient documentation

## 2017-12-16 DIAGNOSIS — T148XXA Other injury of unspecified body region, initial encounter: Secondary | ICD-10-CM | POA: Insufficient documentation

## 2017-12-16 DIAGNOSIS — R110 Nausea: Secondary | ICD-10-CM | POA: Insufficient documentation

## 2017-12-16 DIAGNOSIS — R55 Syncope and collapse: Secondary | ICD-10-CM | POA: Insufficient documentation

## 2017-12-16 DIAGNOSIS — E118 Type 2 diabetes mellitus with unspecified complications: Secondary | ICD-10-CM

## 2017-12-17 DIAGNOSIS — M722 Plantar fascial fibromatosis: Secondary | ICD-10-CM | POA: Diagnosis not present

## 2017-12-17 DIAGNOSIS — M24573 Contracture, unspecified ankle: Secondary | ICD-10-CM | POA: Diagnosis not present

## 2017-12-17 DIAGNOSIS — M7752 Other enthesopathy of left foot: Secondary | ICD-10-CM | POA: Diagnosis not present

## 2017-12-19 ENCOUNTER — Encounter: Payer: Self-pay | Admitting: Physician Assistant

## 2017-12-19 ENCOUNTER — Ambulatory Visit (HOSPITAL_BASED_OUTPATIENT_CLINIC_OR_DEPARTMENT_OTHER)
Admission: RE | Admit: 2017-12-19 | Discharge: 2017-12-19 | Disposition: A | Discharge status: Home / Self Care | Payer: Medicare HMO | Source: Ambulatory Visit | Attending: Physician Assistant | Admitting: Physician Assistant

## 2017-12-19 DIAGNOSIS — I071 Rheumatic tricuspid insufficiency: Secondary | ICD-10-CM | POA: Insufficient documentation

## 2017-12-19 DIAGNOSIS — I951 Orthostatic hypotension: Secondary | ICD-10-CM | POA: Diagnosis not present

## 2017-12-19 DIAGNOSIS — I340 Nonrheumatic mitral (valve) insufficiency: Secondary | ICD-10-CM | POA: Insufficient documentation

## 2017-12-19 DIAGNOSIS — E119 Type 2 diabetes mellitus without complications: Secondary | ICD-10-CM | POA: Diagnosis not present

## 2017-12-19 DIAGNOSIS — R55 Syncope and collapse: Secondary | ICD-10-CM | POA: Diagnosis not present

## 2017-12-19 NOTE — Progress Notes (Signed)
Call pt: echo showed normal EF. You do have some trace tricupsid and mitral regurgitation but no significant finds or anything to suggest cause of syncope.

## 2017-12-19 NOTE — Progress Notes (Signed)
Echocardiogram 2D Echocardiogram has been performed.  Joelene Millin 12/19/2017, 10:55 AM

## 2017-12-25 ENCOUNTER — Other Ambulatory Visit: Payer: Self-pay | Admitting: Physician Assistant

## 2017-12-25 DIAGNOSIS — G2581 Restless legs syndrome: Secondary | ICD-10-CM

## 2018-02-02 ENCOUNTER — Encounter: Payer: Self-pay | Admitting: Physician Assistant

## 2018-02-02 ENCOUNTER — Ambulatory Visit (INDEPENDENT_AMBULATORY_CARE_PROVIDER_SITE_OTHER): Payer: Medicare HMO | Admitting: Physician Assistant

## 2018-02-02 VITALS — BP 130/71 | HR 68 | Wt 178.0 lb

## 2018-02-02 DIAGNOSIS — E782 Mixed hyperlipidemia: Secondary | ICD-10-CM

## 2018-02-02 DIAGNOSIS — I7300 Raynaud's syndrome without gangrene: Secondary | ICD-10-CM

## 2018-02-02 DIAGNOSIS — E118 Type 2 diabetes mellitus with unspecified complications: Secondary | ICD-10-CM

## 2018-02-02 LAB — POCT GLYCOSYLATED HEMOGLOBIN (HGB A1C): HEMOGLOBIN A1C: 6.9 % — AB (ref 4.0–5.6)

## 2018-02-02 MED ORDER — AMLODIPINE BESYLATE 2.5 MG PO TABS: ORAL_TABLET | ORAL | 2 refills | Status: DC

## 2018-02-02 NOTE — Patient Instructions (Signed)
Increase metformin to 1000 mg twice a day

## 2018-02-02 NOTE — Progress Notes (Signed)
Subjective:    Patient ID: Ann Griffith, female    DOB: February 09, 1951, 67 y.o.   MRN: [id number]  HPI  Pt is a 67 yo female with T2DM, HLD who presents to the clinic for medication refills.   DM- not checking sugars. Only taking metformin once a day. She went to my eye doctor in September for eye exam. No hypoglycemia. No open sores or wounds. She feels good.   She has had no other episodes of dizzines or syncope and collapse. appt with neurologist tomorrow. Carotid US done in 2017. No CP or palpitations.   .. Active Ambulatory Problems    Diagnosis Date Noted  . Diabetes mellitus, type II ([place]) 11/09/2007  . 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 CHOLELITHIASIS, HX OF 11/09/2007  . Diverticulosis 06/07/2013  . Raynaud's syndrome 08/09/2013  . Benign paroxysmal positional vertigo 11/02/2014  . Microalbuminuria 11/02/2014  . Hyperlipidemia 11/03/2014  . Seborrheic dermatitis 03/14/2015  . Memory changes 05/05/2015  . Insomnia 08/08/2015  . Right leg pain 08/08/2015  . Shoulder bursitis 08/08/2015  . Avitaminosis D 10/09/2012  . Colon polyp 04/24/2011  . Tennis elbow syndrome 03/29/2016  . RLS (restless legs syndrome) 03/29/2016  . Right shoulder pain 03/29/2016  . 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 Cataract cortical, senile, left 06/04/2016  . Elevated serum creatinine 12/10/2016  . 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 Trigger finger, left ring finger 06/26/2017  . Acute pain of right knee 06/26/2017  . Controlled type 2 diabetes mellitus without complication, without long-term current use of insulin ([place]) 06/26/2017  . Carpal tunnel syndrome, right 06/26/2017  . Dry skin dermatitis 06/26/2017  . Trochanteric bursitis 06/27/2017  . Flatulence 09/28/2017  . Fear of flying 09/28/2017  . Orthostatic hypotension 12/16/2017  . Syncope and collapse 12/16/2017  . Nausea 12/16/2017  . Hematoma and contusion 12/16/2017  . Mitral  regurgitation 12/19/2017  . Tricuspid regurgitation 12/19/2017  . Raynaud's phenomenon without gangrene 02/03/2018   Resolved Ambulatory Problems    Diagnosis Date Noted  . Eustachian tube dysfunction 05/02/2014   Past Medical History:  Diagnosis Date  . CHOLELITHIASIS, HX OF 11/09/2007  . DIABETES MELLITUS, TYPE II 11/09/2007  . Diverticulosis   . Hyperlipidemia       Review of Systems  All other systems reviewed and are negative.      Objective:   Physical Exam  Constitutional: She is oriented to person, place, and time. She appears well-developed and well-nourished.  HENT:  Head: Normocephalic and atraumatic.  Neck: No thyromegaly present.  Cardiovascular: Normal rate and regular rhythm.  Pulmonary/Chest: Effort normal and breath sounds normal.  Neurological: She is alert and oriented to person, place, and time.  Skin: Skin is warm.  Psychiatric: She has a normal mood and affect. Her behavior is normal.          Assessment & Plan:  [name] was seen today for diabetes.  Diagnoses and all orders for this visit:  Type 2 diabetes mellitus with complication, without long-term current use of insulin ([facility]) -     POCT HgB A1C -     COMPLETE METABOLIC PANEL WITH GFR  Raynaud's phenomenon without gangrene -     amLODipine (NORVASC) 2.5 MG tablet; TAKE 1-2 TABLET (2.5 MG TOTAL) BY MOUTH DAILY.  Mixed hyperlipidemia -     Lipid Panel w/reflex Direct LDL   .[name] Lab Results  Component Value Date   HGBA1C 6.9 (A) 02/02/2018   A!C under 7.  I would like for her to increase metformin to 1000mg  bid.  On zocor. On lisinopril. BP controlled. Will get eye exam.  Continue with diet and exercise.  Vaccines up to date.   Pt has appt in the am for syncope and collapse. Printed last carotid dopplers for them to view. Echo normal done 12/2017.

## 2018-02-03 ENCOUNTER — Encounter: Payer: Self-pay | Admitting: Physician Assistant

## 2018-02-03 ENCOUNTER — Telehealth: Payer: Self-pay | Admitting: Physician Assistant

## 2018-02-03 DIAGNOSIS — I7300 Raynaud's syndrome without gangrene: Secondary | ICD-10-CM | POA: Insufficient documentation

## 2018-02-03 DIAGNOSIS — R55 Syncope and collapse: Secondary | ICD-10-CM | POA: Diagnosis not present

## 2018-02-03 DIAGNOSIS — R42 Dizziness and giddiness: Secondary | ICD-10-CM | POA: Diagnosis not present

## 2018-02-03 NOTE — Telephone Encounter (Signed)
Please contact my eye doctor for last eye exam.

## 2018-02-03 NOTE — Telephone Encounter (Signed)
Record requested.

## 2018-02-05 DIAGNOSIS — E118 Type 2 diabetes mellitus with unspecified complications: Secondary | ICD-10-CM | POA: Diagnosis not present

## 2018-02-05 DIAGNOSIS — E782 Mixed hyperlipidemia: Secondary | ICD-10-CM | POA: Diagnosis not present

## 2018-02-05 LAB — COMPLETE METABOLIC PANEL WITH GFR
AG Ratio: 1.5 (calc) (ref 1.0–2.5)
ALT: 25 U/L (ref 6–29)
AST: 24 U/L (ref 10–35)
Albumin: 4.1 g/dL (ref 3.6–5.1)
Alkaline phosphatase (APISO): 47 U/L (ref 33–130)
BUN/Creatinine Ratio: 14 (calc) (ref 6–22)
BUN: 14 mg/dL (ref 7–25)
CO2: 25 mmol/L (ref 20–32)
Calcium: 9.3 mg/dL (ref 8.6–10.4)
Chloride: 101 mmol/L (ref 98–110)
Creat: 1.03 mg/dL — ABNORMAL HIGH (ref 0.50–0.99)
GFR, Est African American: 65 mL/min/{1.73_m2} (ref >=60)
GFR, Est Non African American: 56 mL/min/{1.73_m2} — ABNORMAL LOW (ref >=60)
GLOBULIN: 2.8 g/dL (ref 1.9–3.7)
Glucose, Bld: 122 mg/dL — ABNORMAL HIGH (ref 65–99)
POTASSIUM: 4.4 mmol/L (ref 3.5–5.3)
SODIUM: 134 mmol/L — AB (ref 135–146)
Total Bilirubin: 0.6 mg/dL (ref 0.2–1.2)
Total Protein: 6.9 g/dL (ref 6.1–8.1)

## 2018-02-05 LAB — LIPID PANEL W/REFLEX DIRECT LDL
CHOL/HDL RATIO: 2.7 (calc) (ref <5.0)
CHOLESTEROL: 174 mg/dL (ref <200)
HDL: 64 mg/dL (ref >50)
LDL Cholesterol (Calc): 92 mg/dL (calc)
Non-HDL Cholesterol (Calc): 110 mg/dL (calc) (ref <130)
Triglycerides: 85 mg/dL (ref <150)

## 2018-02-05 NOTE — Progress Notes (Signed)
Call pt: cholesterol looks good. Kidney function improved.

## 2018-02-10 DIAGNOSIS — R55 Syncope and collapse: Secondary | ICD-10-CM | POA: Diagnosis not present

## 2018-02-11 DIAGNOSIS — R55 Syncope and collapse: Secondary | ICD-10-CM | POA: Diagnosis not present

## 2018-03-19 ENCOUNTER — Other Ambulatory Visit: Payer: Self-pay | Admitting: Physician Assistant

## 2018-03-19 DIAGNOSIS — E118 Type 2 diabetes mellitus with unspecified complications: Secondary | ICD-10-CM

## 2018-03-20 IMAGING — DX DG SHOULDER 2+V*R*
3 series · 3 of 3 positions shown · non-contrast
Comparison: None.

CLINICAL DATA: Worsening right shoulder pain over the past year, no
known injury, initial encounter

EXAM:
RIGHT SHOULDER - 2+ VIEW

[shoulder grashey]
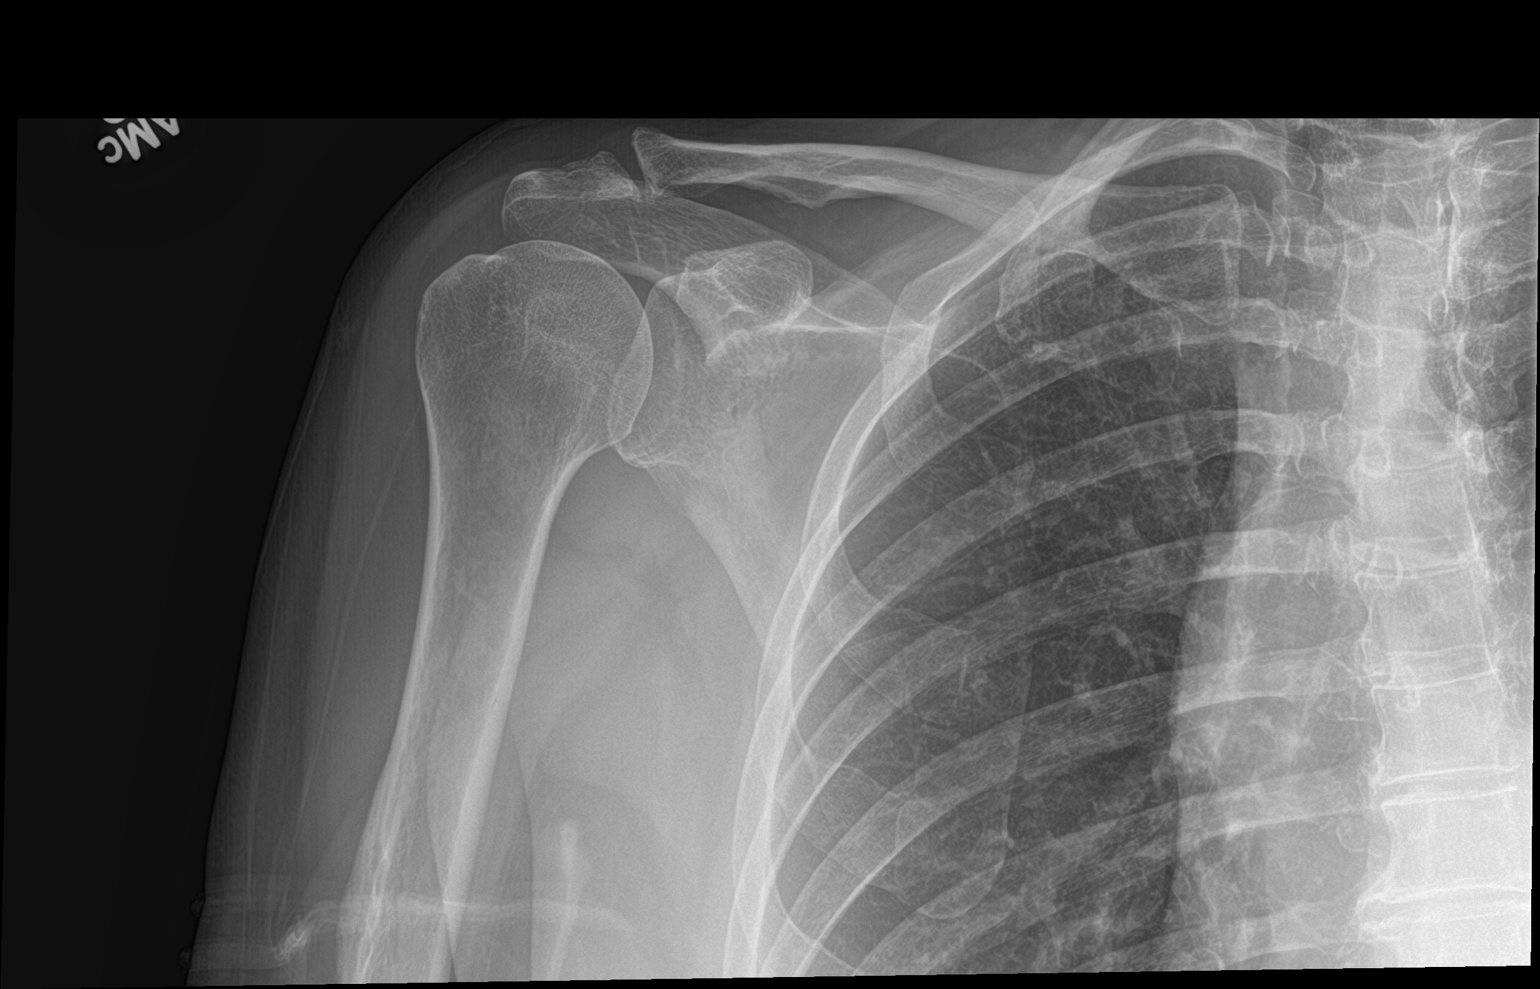

[shoulder y view]
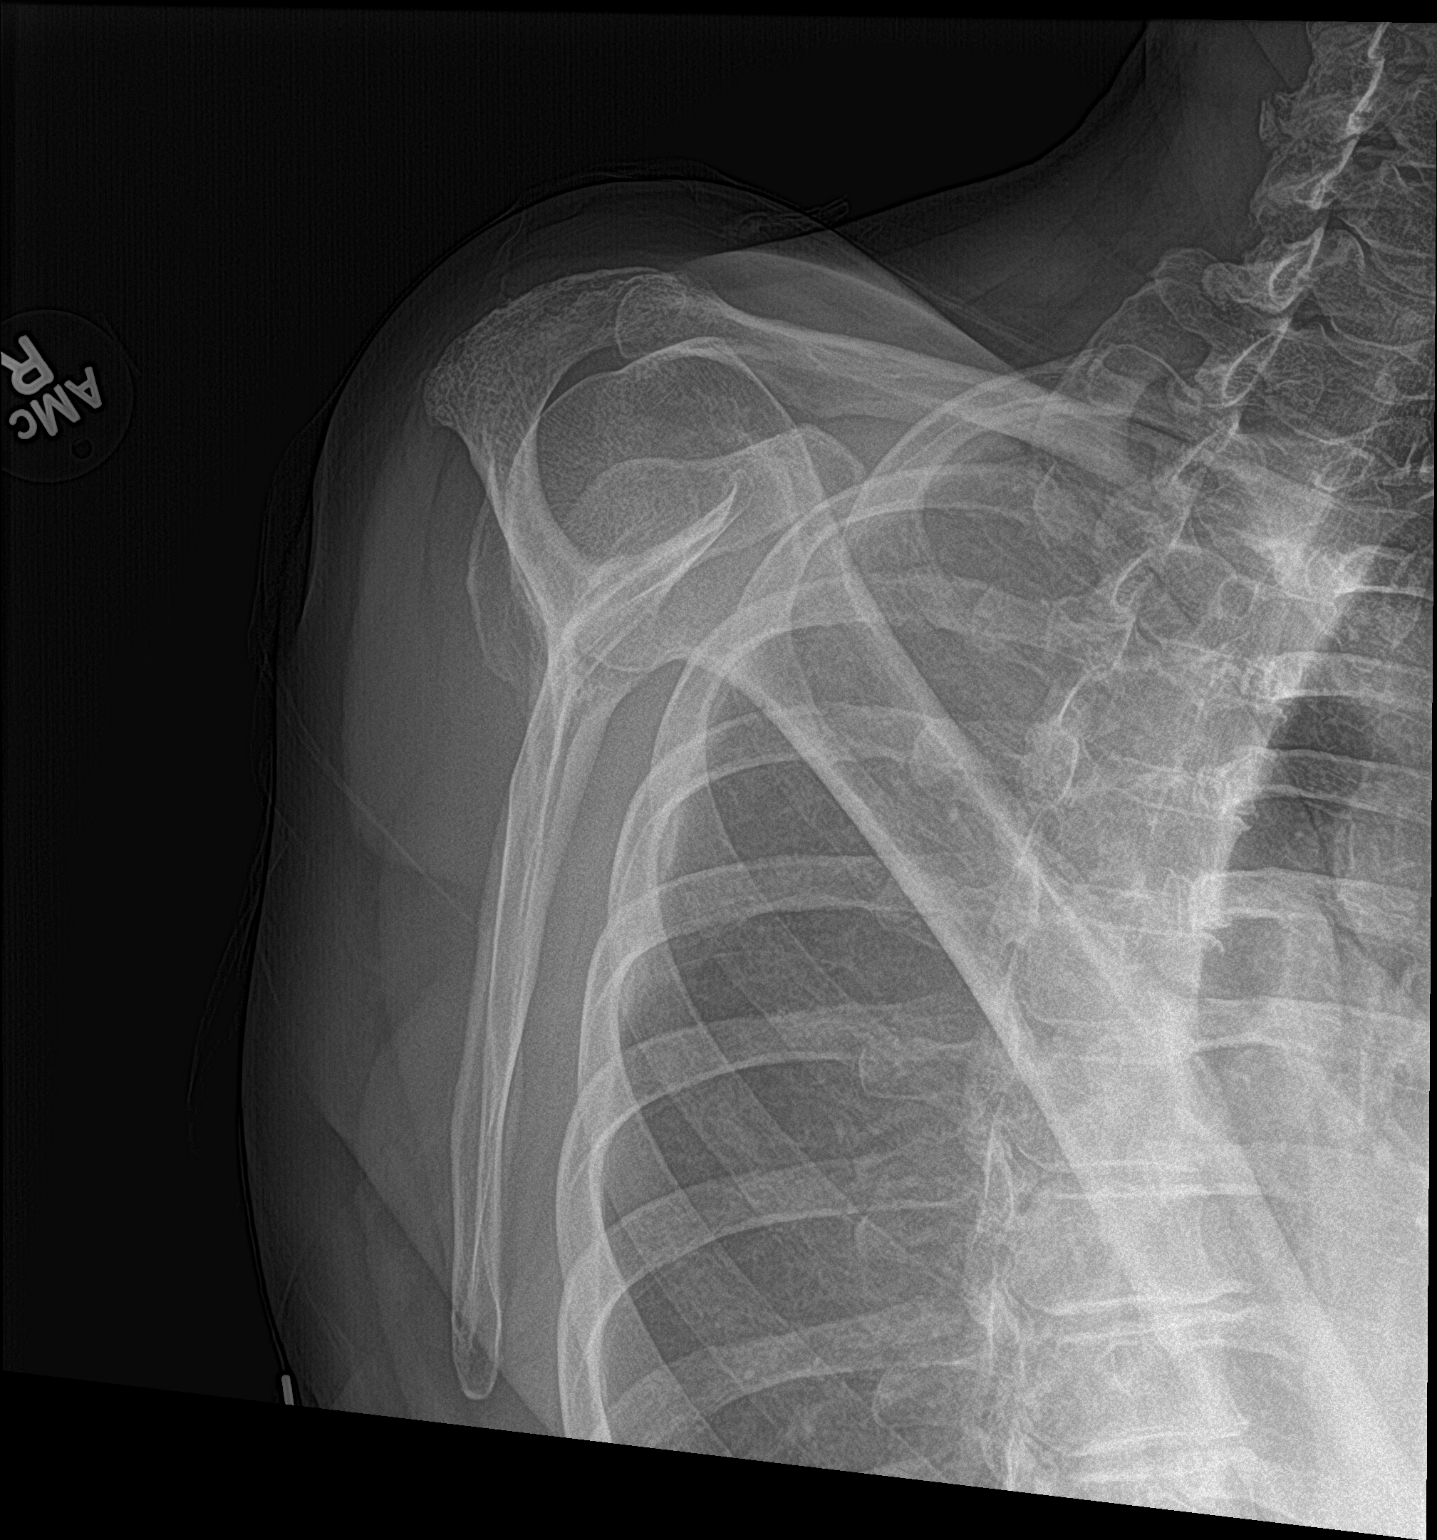

[shoulder axillary]
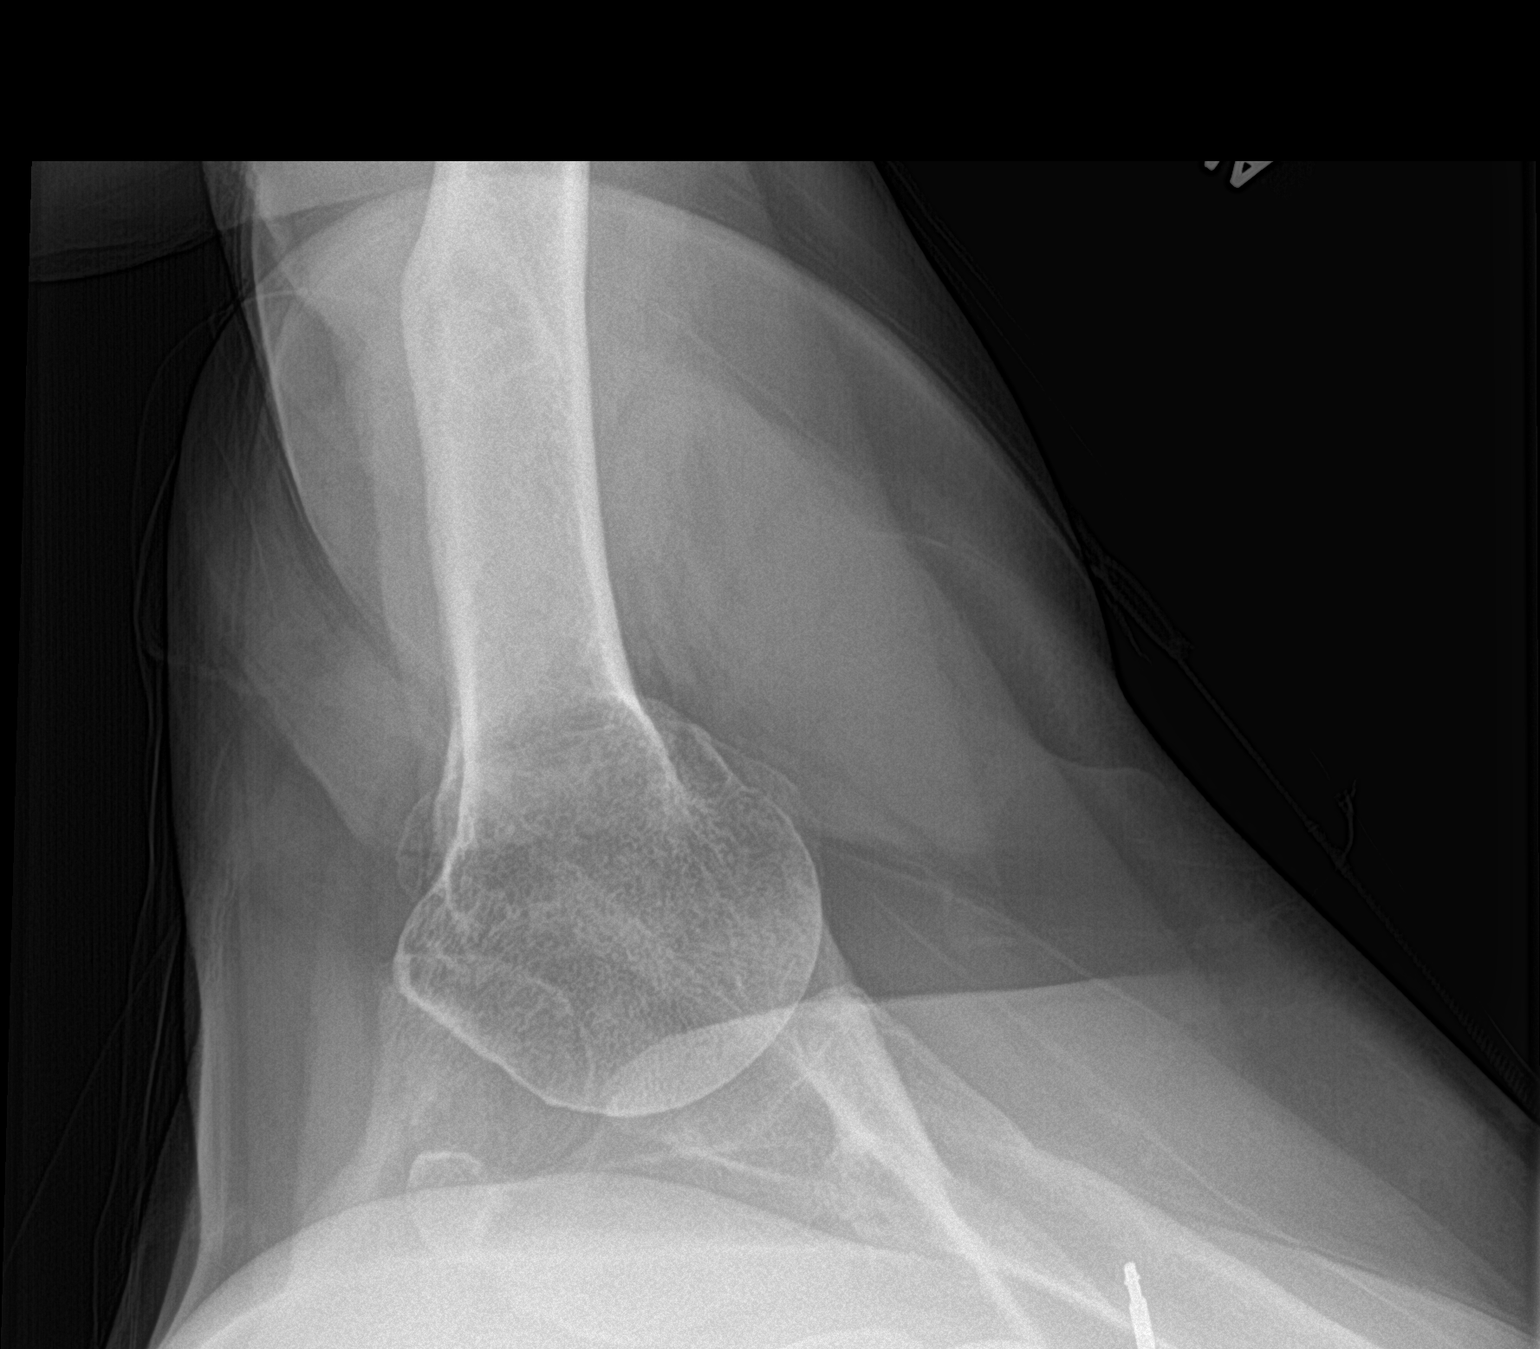

[3 of 3 positions shown; findings below may reference images not displayed]

FINDINGS: Degenerative changes of the acromioclavicular joint are seen. No
acute fracture or dislocation is noted. No soft tissue abnormality
is noted.
IMPRESSION: No acute abnormality seen.

## 2018-03-24 DIAGNOSIS — Z780 Asymptomatic menopausal state: Secondary | ICD-10-CM | POA: Diagnosis not present

## 2018-03-24 DIAGNOSIS — Z1231 Encounter for screening mammogram for malignant neoplasm of breast: Secondary | ICD-10-CM | POA: Diagnosis not present

## 2018-03-27 DIAGNOSIS — R922 Inconclusive mammogram: Secondary | ICD-10-CM | POA: Diagnosis not present

## 2018-04-14 ENCOUNTER — Encounter: Payer: Self-pay | Admitting: Obstetrics & Gynecology

## 2018-04-14 ENCOUNTER — Other Ambulatory Visit: Payer: Self-pay

## 2018-04-14 ENCOUNTER — Ambulatory Visit (INDEPENDENT_AMBULATORY_CARE_PROVIDER_SITE_OTHER): Payer: Medicare HMO | Admitting: Obstetrics & Gynecology

## 2018-04-14 VITALS — BP 128/66 | HR 64 | Resp 16 | Ht 67.0 in | Wt 177.8 lb

## 2018-04-14 DIAGNOSIS — Z01419 Encounter for gynecological examination (general) (routine) without abnormal findings: Secondary | ICD-10-CM

## 2018-04-14 NOTE — Progress Notes (Signed)
67 y.o. G1P1 MarriedCaucasianF here for annual exam.  Had episode of syncope in April.  She hit her forehead and had a hematoma.  Husband took her to urgent care.  She was then transferred to the ER.  Had blood work and CT scans and EKG.  Has seen neurologist and had EEG and carotid dopplers.  Feeling normal now with no new symptoms.  Denies vaginal bleeding.  Patient's last menstrual period was 08/12/2000.          Sexually active: Yes.    The current method of family planning is post menopausal status.    Exercising: Yes.    gym, walk 3 miles, machines Smoker:  no  Health Maintenance: Pap:  10/28/16 Neg   06/16/14 Neg  History of abnormal Pap:  no MMG: 03/27/18 [place] Right BIRADS3:Probably benign. F/u 6 months  Colonoscopy:  06/03/13 normal. f/u 5 years  BMD:   2009.  Has declined any treatment if it were abnormal.   TDaP:  12/2009 Pneumonia vaccine(s):  Done  Shingrix: Zostavax Hep C testing: 10/30/15 Neg Screening Labs: PCP   reports that she has never smoked. She has never used smokeless tobacco. She reports that she drinks about 1.0 standard drinks of alcohol per week. She reports that she does not use drugs.  Past Medical History:  Diagnosis Date  . CHOLELITHIASIS, HX OF 11/09/2007   Qualifier: Diagnosis of  By: [name] Mcalpine    . DIABETES MELLITUS, TYPE II 11/09/2007   Qualifier: Diagnosis of  By: [name] Mcalpine    . Diverticulosis   . Hyperlipidemia     Past Surgical History:  Procedure Laterality Date  . bone spur removed  2003   left heel  . CATARACT EXTRACTION Left 02/2016  . CHOLECYSTECTOMY    . COLONOSCOPY  08/2003    Current Outpatient Medications  Medication Sig Dispense Refill  . AMBULATORY NON FORMULARY MEDICATION Test strips and lancets.  Testing once a day.   Dx: 250.00 100 strip 1  . amLODipine (NORVASC) 2.5 MG tablet TAKE 1-2 TABLET (2.5 MG TOTAL) BY MOUTH DAILY. 180 tablet 2  . cholecalciferol (VITAMIN D) 1000 UNITS tablet Take 1,000 Units by  mouth 3 (three) times daily.     . Cyanocobalamin (VITAMIN B-12 PO) Take 5,000 mcg by mouth.    . diclofenac sodium (VOLTAREN) 1 % GEL Apply 4 g 4 (four) times daily topically. To affected joint. 100 g 11  . fish oil-omega-3 fatty acids 1000 MG capsule Take 1 g by mouth daily.    [name] lisinopril (PRINIVIL,ZESTRIL) 2.5 MG tablet Take 1 tablet (2.5 mg total) by mouth daily. Due for follow up visit 30 tablet 0  . metFORMIN (GLUCOPHAGE) 500 MG tablet Take 2 tabs twice a day with meals. 360 tablet 2  . Misc. Devices MISC One touch Ultra test strips 50/package  To test once a day for DM type II controlled. 50 each 1  . ondansetron (ZOFRAN) 8 MG tablet Take 1 tablet (8 mg total) by mouth every 8 (eight) hours as needed for nausea or vomiting. 20 tablet 1  . rOPINIRole (REQUIP) 0.25 MG tablet TAKE 1-4 TABLETS BY MOUTH AT BEDTIME 90 tablet 5  . simvastatin (ZOCOR) 40 MG tablet Take 1 tablet (40 mg total) by mouth daily. 30 tablet 5   No current facility-administered medications for this visit.     Family History  Problem Relation Age of Onset  . Diabetes Mother     Review of Systems  All other systems reviewed and are negative.   Exam:   BP 128/66 (BP Location: Left Arm, Patient Position: Sitting, Cuff Size: Large)   Pulse 64   Resp 16   Ht 5\' 7"  (1.702 m)   Wt 177 lb 12.8 oz (80.6 kg)   LMP 08/12/2000   BMI 27.85 kg/m    Height: 5\' 7"  (170.2 cm)  Ht Readings from Last 3 Encounters:  04/14/18 5\' 7"  (1.702 m)  12/15/17 5\' 6"  (1.676 m)  09/24/17 5\' 6"  (1.676 m)    General appearance: alert, cooperative and appears stated age Head: Normocephalic, without obvious abnormality, atraumatic Neck: no adenopathy, supple, symmetrical, trachea midline and thyroid normal to inspection and palpation Lungs: clear to auscultation bilaterally Breasts: normal appearance, no masses or tenderness Heart: regular rate and rhythm Abdomen: soft, non-tender; bowel sounds normal; no masses,  no  organomegaly Extremities: extremities normal, atraumatic, no cyanosis or edema Skin: Skin color, texture, turgor normal. No rashes or lesions Lymph nodes: Cervical, supraclavicular, and axillary nodes normal. No abnormal inguinal nodes palpated Neurologic: Grossly normal   Pelvic: External genitalia:  no lesions              Urethra:  normal appearing urethra with no masses, tenderness or lesions              Bartholins and Skenes: normal                 Vagina: normal appearing vagina with normal color and discharge, no lesions              Cervix: no lesions              Pap taken: No. Bimanual Exam:  Uterus:  normal size, contour, position, consistency, mobility, non-tender              Adnexa: normal adnexa and no mass, fullness, tenderness               Rectovaginal: Confirms               Anus:  normal sphincter tone, no lesions  Chaperone was present for exam.  A:  Well Woman with normal exam PMP, no HRT Type 2 Diabetes, on metformin.  PCP follows. Sees derm every other year. Vaginal atrophic changes Syncopal episode in April.  Followed by [facility] neurology. Elevated lipids  P:   Mammogram yearly.  Has follow up due from most recent MMG in six months.   pap smear not indicated D/W pt shingrix vaccination Blood work has been done this year due to syncopal issues return annually or prn

## 2018-04-15 ENCOUNTER — Encounter: Payer: Self-pay | Admitting: Obstetrics & Gynecology

## 2018-04-18 ENCOUNTER — Other Ambulatory Visit: Payer: Self-pay | Admitting: Physician Assistant

## 2018-04-18 DIAGNOSIS — E118 Type 2 diabetes mellitus with unspecified complications: Secondary | ICD-10-CM

## 2018-04-27 ENCOUNTER — Ambulatory Visit (INDEPENDENT_AMBULATORY_CARE_PROVIDER_SITE_OTHER): Payer: Medicare HMO | Admitting: Physician Assistant

## 2018-04-27 ENCOUNTER — Encounter: Payer: Self-pay | Admitting: Physician Assistant

## 2018-04-27 VITALS — BP 130/66 | HR 66 | Ht 67.0 in | Wt 178.0 lb

## 2018-04-27 DIAGNOSIS — E118 Type 2 diabetes mellitus with unspecified complications: Secondary | ICD-10-CM | POA: Diagnosis not present

## 2018-04-27 DIAGNOSIS — M25572 Pain in left ankle and joints of left foot: Secondary | ICD-10-CM

## 2018-04-27 DIAGNOSIS — G5603 Carpal tunnel syndrome, bilateral upper limbs: Secondary | ICD-10-CM

## 2018-04-27 DIAGNOSIS — L299 Pruritus, unspecified: Secondary | ICD-10-CM | POA: Insufficient documentation

## 2018-04-27 DIAGNOSIS — Z23 Encounter for immunization: Secondary | ICD-10-CM | POA: Diagnosis not present

## 2018-04-27 DIAGNOSIS — M25562 Pain in left knee: Secondary | ICD-10-CM | POA: Diagnosis not present

## 2018-04-27 DIAGNOSIS — G8929 Other chronic pain: Secondary | ICD-10-CM | POA: Insufficient documentation

## 2018-04-27 DIAGNOSIS — M25561 Pain in right knee: Secondary | ICD-10-CM | POA: Diagnosis not present

## 2018-04-27 DIAGNOSIS — M25571 Pain in right ankle and joints of right foot: Secondary | ICD-10-CM

## 2018-04-27 DIAGNOSIS — H8111 Benign paroxysmal vertigo, right ear: Secondary | ICD-10-CM

## 2018-04-27 LAB — POCT GLYCOSYLATED HEMOGLOBIN (HGB A1C): Hemoglobin A1C: 6.9 % — AB (ref 4.0–5.6)

## 2018-04-27 MED ORDER — TRIAMCINOLONE ACETONIDE 0.1 % EX CREA: 1.0000 "application " | TOPICAL_CREAM | Freq: Two times a day (BID) | CUTANEOUS | 0 refills | Status: DC

## 2018-04-27 NOTE — Patient Instructions (Addendum)
Tumeric 500mg  twice a day. Increase fish oil to 4000mg  daily.  Glucosamine chondrotin.  hydro dissection of median nerve consideration.     Osteoarthritis Osteoarthritis is a type of arthritis that affects tissue that covers the ends of bones in joints (cartilage). Cartilage acts as a cushion between the bones and helps them move smoothly. Osteoarthritis results when cartilage in the joints gets worn down. Osteoarthritis is sometimes called "wear and tear" arthritis. Osteoarthritis is the most common form of arthritis. It often occurs in older people. It is a condition that gets worse over time (a progressive condition). Joints that are most often affected by this condition are in:  Fingers.  Toes.  Hips.  Knees.  Spine, including neck and lower back.  What are the causes? This condition is caused by age-related wearing down of cartilage that covers the ends of bones. What increases the risk? The following factors may make you more likely to develop this condition:  Older age.  Being overweight or obese.  Overuse of joints, such as in athletes.  Past injury of a joint.  Past surgery on a joint.  Family history of osteoarthritis.  What are the signs or symptoms? The main symptoms of this condition are pain, swelling, and stiffness in the joint. The joint may lose its shape over time. Small pieces of bone or cartilage may break off and float inside of the joint, which may cause more pain and damage to the joint. Small deposits of bone (osteophytes) may grow on the edges of the joint. Other symptoms may include:  A grating or scraping feeling inside the joint when you move it.  Popping or creaking sounds when you move.  Symptoms may affect one or more joints. Osteoarthritis in a major joint, such as your knee or hip, can make it painful to walk or exercise. If you have osteoarthritis in your hands, you might not be able to grip items, twist your hand, or control small  movements of your hands and fingers (fine motor skills). How is this diagnosed? This condition may be diagnosed based on:  Your medical history.  A physical exam.  Your symptoms.  X-rays of the affected joint(s).  Blood tests to rule out other types of arthritis.  How is this treated? There is no cure for this condition, but treatment can help to control pain and improve joint function. Treatment plans may include:  A prescribed exercise program that allows for rest and joint relief. You may work with a physical therapist.  A weight control plan.  Pain relief techniques, such as: ? Applying heat and cold to the joint. ? Electric pulses delivered to nerve endings under the skin (transcutaneous electrical nerve stimulation, or TENS). ? Massage. ? Certain nutritional supplements.  NSAIDs or prescription medicines to help relieve pain.  Medicine to help relieve pain and inflammation (corticosteroids). This can be given by mouth (orally) or as an injection.  Assistive devices, such as a brace, wrap, splint, specialized glove, or cane.  Surgery, such as: ? An osteotomy. This is done to reposition the bones and relieve pain or to remove loose pieces of bone and cartilage. ? Joint replacement surgery. You may need this surgery if you have very bad (advanced) osteoarthritis.  Follow these instructions at home: Activity  Rest your affected joints as directed by your health care provider.  Do not drive or use heavy machinery while taking prescription pain medicine. Exercise as directed. Your health care provider or physical therapist may  Carpal Tunnel Syndrome Carpal tunnel syndrome is a condition that causes pain in your hand and arm. The carpal tunnel is a narrow area located on the palm side of your wrist. Repeated wrist motion or certain diseases may cause swelling within the tunnel. This swelling pinches the main nerve in the wrist (median nerve). What are the causes? This  condition may be caused by: Repeated wrist motions. Wrist injuries. Arthritis. A cyst or tumor in the carpal tunnel. Fluid buildup during pregnancy.  Sometimes the cause of this condition is not known. What increases the risk? This condition is more likely to develop in: People who have jobs that cause them to repeatedly move their wrists in the same motion, such as Art gallery manager. Women. People with certain conditions, such as: Diabetes. Obesity. An underactive thyroid (hypothyroidism). Kidney failure.  What are the signs or symptoms? Symptoms of this condition include: A tingling feeling in your fingers, especially in your thumb, index, and middle fingers. Tingling or numbness in your hand. An aching feeling in your entire arm, especially when your wrist and elbow are bent for long periods of time. Wrist pain that goes up your arm to your shoulder. Pain that goes down into your palm or fingers. A weak feeling in your hands. You may have trouble grabbing and holding items.  Your symptoms may feel worse during the night. How is this diagnosed? This condition is diagnosed with a medical history and physical exam. You may also have tests, including: An electromyogram (EMG). This test measures electrical signals sent by your nerves into the muscles. X-rays.  How is this treated? Treatment for this condition includes: Lifestyle changes. It is important to stop doing or modify the activity that caused your condition. Physical or occupational therapy. Medicines for pain and inflammation. This may include medicine that is injected into your wrist. A wrist splint. Surgery.  Follow these instructions at home: If you have a splint: Wear it as told by your health care provider. Remove it only as told by your health care provider. Loosen the splint if your fingers become numb and tingle, or if they turn cold and blue. Keep the splint clean and dry. General instructions Take  over-the-counter and prescription medicines only as told by your health care provider. Rest your wrist from any activity that may be causing your pain. If your condition is work related, talk to your employer about changes that can be made, such as getting a wrist pad to use while typing. If directed, apply ice to the painful area: Put ice in a plastic bag. Place a towel between your skin and the bag. Leave the ice on for 20 minutes, 2-3 times per day. Keep all follow-up visits as told by your health care provider. This is important. Do any exercises as told by your health care provider, physical therapist, or occupational therapist. Contact a health care provider if: You have new symptoms. Your pain is not controlled with medicines. Your symptoms get worse. This information is not intended to replace advice given to you by your health care provider. Make sure you discuss any questions you have with your health care provider. Document Released: 07/26/2000 Document Revised: 12/07/2015 Document Reviewed: 12/14/2014 Elsevier Interactive Patient Education  [employer].  recommend specific types of exercise, such as: ? Strengthening exercises. These are done to strengthen the muscles that support joints that are affected by arthritis. They can be performed with weights or with exercise bands to add resistance. ? Aerobic  activities. These are exercises, such as brisk walking or water aerobics, that get your heart pumping. ? Range-of-motion activities. These keep your joints easy to move. ? Balance and agility exercises. Managing pain, stiffness, and swelling  If directed, apply heat to the affected area as often as told by your health care provider. Use the heat source that your health care provider recommends, such as a moist heat pack or a heating pad. ? If you have a removable assistive device, remove it as told by your health care provider. ? Place a towel between your skin and the heat  source. If your health care provider tells you to keep the assistive device on while you apply heat, place a towel between the assistive device and the heat source. ? Leave the heat on for 20-30 minutes. ? Remove the heat if your skin turns bright red. This is especially important if you are unable to feel pain, heat, or cold. You may have a greater risk of getting burned.  If directed, put ice on the affected joint: ? If you have a removable assistive device, remove it as told by your health care provider. ? Put ice in a plastic bag. ? Place a towel between your skin and the bag. If your health care provider tells you to keep the assistive device on during icing, place a towel between the assistive device and the bag. ? Leave the ice on for 20 minutes, 2-3 times a day. General instructions  Take over-the-counter and prescription medicines only as told by your health care provider.  Maintain a healthy weight. Follow instructions from your health care provider for weight control. These may include dietary restrictions.  Do not use any products that contain nicotine or tobacco, such as cigarettes and e-cigarettes. These can delay bone healing. If you need help quitting, ask your health care provider.  Use assistive devices as directed by your health care provider.  Keep all follow-up visits as told by your health care provider. This is important. Where to find more information:  [employer] of Arthritis and Musculoskeletal and Skin Diseases: www.niams.SouthExposed.es  [employer] on Aging: http://kim-miller.com/  American College of Rheumatology: www.rheumatology.org Contact a health care provider if:  Your skin turns red.  You develop a rash.  You have pain that gets worse.  You have a fever along with joint or muscle aches. Get help right away if:  You lose a lot of weight.  You suddenly lose your appetite.  You have night sweats. Summary  Osteoarthritis is a type of  arthritis that affects tissue covering the ends of bones in joints (cartilage).  This condition is caused by age-related wearing down of cartilage that covers the ends of bones.  The main symptom of this condition is pain, swelling, and stiffness in the joint.  There is no cure for this condition, but treatment can help to control pain and improve joint function. This information is not intended to replace advice given to you by your health care provider. Make sure you discuss any questions you have with your health care provider. Document Released: 07/29/2005 Document Revised: 04/01/2016 Document Reviewed: 04/01/2016 Elsevier Interactive Patient Education  [employer].

## 2018-04-27 NOTE — Progress Notes (Signed)
Subjective:    Patient ID: Ann Griffith, female    DOB: August 05, 1951, 67 y.o.   MRN: [id number]  HPI Pt is a 67 yo female with HTN, T2DM, RLS, HLD who presents to the clinic for 3 month follow up.   DM- checking sugars in the am running 100's to 140's. No hypoglycemia. No open sores or wounds. Taking metformin daily but only 2 tablets in the am.  Lab Results  Component Value Date   HGBA1C 6.9 (A) 04/27/2018    Pt reports an itchy thumb for a few weeks. No rash or pain. No injury. No cause for itchiness. Not done anything to make better.   She wants to proceed to the next step for treatment for carpal tunnel of both hands with right being worse than left.  She has had for years. Night splints helped for a while. She is losing some strength in the right hand.   She is also having some bilateral knee pain and ankle pain. Denies injury. Worse when sitting or laying for long periods of time. Better with ambulation. Feels kind of achy.   She does complain of some dizziness. Hx of vertigo.   .. Active Ambulatory Problems    Diagnosis Date Noted  . Diabetes mellitus, type II ([place]) 11/09/2007  . 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 CHOLELITHIASIS, HX OF 11/09/2007  . Diverticulosis 06/07/2013  . Raynaud's syndrome 08/09/2013  . BPPV (benign paroxysmal positional vertigo), right 11/02/2014  . Microalbuminuria 11/02/2014  . Seborrheic dermatitis 03/14/2015  . Memory changes 05/05/2015  . Insomnia 08/08/2015  . Right leg pain 08/08/2015  . Shoulder bursitis 08/08/2015  . Avitaminosis D 10/09/2012  . Colon polyp 04/24/2011  . Tennis elbow syndrome 03/29/2016  . RLS (restless legs syndrome) 03/29/2016  . Right shoulder pain 03/29/2016  . 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 Cataract cortical, senile, left 06/04/2016  . Elevated serum creatinine 12/10/2016  . 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 Trigger finger, left ring finger 06/26/2017  .  Chronic pain of both knees 06/26/2017  . Carpal tunnel syndrome, right 06/26/2017  . Dry skin dermatitis 06/26/2017  . Trochanteric bursitis 06/27/2017  . Flatulence 09/28/2017  . Fear of flying 09/28/2017  . Orthostatic hypotension 12/16/2017  . Syncope and collapse 12/16/2017  . Mitral regurgitation 12/19/2017  . Tricuspid regurgitation 12/19/2017  . Raynaud's phenomenon without gangrene 02/03/2018  . Concussion 12/08/2017  . Essential hypertension 12/10/2017  . Combined hyperlipidemia associated with type 2 diabetes mellitus ([place]) 04/24/2011  . Itching 04/27/2018  . Chronic pain of both ankles 04/27/2018  . Bilateral carpal tunnel syndrome 04/27/2018   Resolved Ambulatory Problems    Diagnosis Date Noted  . Eustachian tube dysfunction 05/02/2014  . Hyperlipidemia 11/03/2014  . Controlled type 2 diabetes mellitus without complication, without long-term current use of insulin ([place]) 06/26/2017  . Nausea 12/16/2017  . Hematoma and contusion 12/16/2017   Past Medical History:  Diagnosis Date  . DIABETES MELLITUS, TYPE II 11/09/2007         Review of Systems  All other systems reviewed and are negative.      Objective:   Physical Exam  Constitutional: She is oriented to person, place, and time. She appears well-developed and well-nourished.  HENT:  Head: Normocephalic and atraumatic.  Cardiovascular: Normal rate and regular rhythm.  Pulmonary/Chest: Effort normal and breath sounds normal.  Musculoskeletal:  tinels positive on the right. Negative to the left.  phalens positive to the right in  the 2 middle fingers. Negative to the left  Crepitus bilateral knees 2+.  ROM normal bilateral knees.  No significant tenderness to palpation of knees or ankles.   Neurological: She is alert and oriented to person, place, and time.  Positive dix hallpike with reported dizziness and nystagmus to the right only.   Skin:  Left thumb without redness, tenderness, scaliness or any  abnormality.   Psychiatric: She has a normal mood and affect. Her behavior is normal.          Assessment & Plan:  [name] KitchenMarland KitchenDiagnoses and all orders for this visit:  Type 2 diabetes mellitus with complication, without long-term current use of insulin (HCC) -     POCT glycosylated hemoglobin (Hb A1C)  Chronic pain of both knees  Chronic pain of both ankles  Itching -     triamcinolone cream (KENALOG) 0.1 %; Apply 1 application topically 2 (two) times daily.  BPPV (benign paroxysmal positional vertigo), right  Need for immunization against influenza -     Flu Vaccine QUAD 36+ mos IM  Bilateral carpal tunnel syndrome -     NCV with EMG(electromyography)   .[name] Lab Results  Component Value Date   HGBA1C 6.9 (A) 04/27/2018   A!C unchanged. Still borderline.  Encouraged patient to take the 2 tablets in evening as well.  Flu shot given today.   No abnormality seen in the thumb. Could be some presentation of neuropathy. Will try some topical steroid just to see if skin is inflammed.   Ordered EMG to evaluate carpal tunnel. Discussed sports medicine and hydro dissection of the median nerve if did not want to do surgery right away.   Knee and ankle pain sound like OA. Will hold on imaging. Will try natural remedies. Tumeric, glucosamine, fish oil. Hold NSAIDs due to elevated serum creatine.  Encourage patient low impact exercising.  If not improving encouraged x-rays and follow-up with sports medicine for possible injections.  Dix-Hallpike was positive to the right.  Patient does have some vertigo.  Epley maneuvers given today.  Encouraged her to do 3 times a day at least 3 rounds until dizziness has resolved.  No improvement in the next 36 to 48 hours we could get set up with some vestibular rehab.  [name].Spent 40 minutes with patient and greater than 50 percent of visit spent counseling patient regarding treatment plan.

## 2018-04-29 ENCOUNTER — Encounter: Payer: Self-pay | Admitting: Physician Assistant

## 2018-04-29 NOTE — Telephone Encounter (Signed)
Ann Griffith this message is for you and it was sent to me by mistake

## 2018-06-02 DIAGNOSIS — I10 Essential (primary) hypertension: Secondary | ICD-10-CM | POA: Diagnosis not present

## 2018-06-02 DIAGNOSIS — H2511 Age-related nuclear cataract, right eye: Secondary | ICD-10-CM | POA: Diagnosis not present

## 2018-06-02 DIAGNOSIS — E119 Type 2 diabetes mellitus without complications: Secondary | ICD-10-CM | POA: Diagnosis not present

## 2018-06-02 DIAGNOSIS — Z0100 Encounter for examination of eyes and vision without abnormal findings: Secondary | ICD-10-CM | POA: Diagnosis not present

## 2018-06-02 DIAGNOSIS — E7800 Pure hypercholesterolemia, unspecified: Secondary | ICD-10-CM | POA: Diagnosis not present

## 2018-06-02 LAB — HM DIABETES EYE EXAM

## 2018-06-03 ENCOUNTER — Ambulatory Visit (INDEPENDENT_AMBULATORY_CARE_PROVIDER_SITE_OTHER): Payer: Medicare HMO | Admitting: Neurology

## 2018-06-03 ENCOUNTER — Ambulatory Visit: Payer: Medicare HMO | Admitting: Neurology

## 2018-06-03 ENCOUNTER — Encounter: Payer: Self-pay | Admitting: Neurology

## 2018-06-03 DIAGNOSIS — G5603 Carpal tunnel syndrome, bilateral upper limbs: Secondary | ICD-10-CM | POA: Diagnosis not present

## 2018-06-03 NOTE — Procedures (Signed)
     HISTORY:  Ann Griffith is a 67 year old patient with a 2-year history of numbness in the hands and achy sensations to the elbows on both sides, the right side is worse than the left.  She denies any neck pain or pain down the arms.  The patient is being evaluated for a possible neuropathy or a cervical radiculopathy.  NERVE CONDUCTION STUDIES:  Nerve conduction studies were performed on both upper extremities.  The distal motor latencies for the median nerves were prolonged bilaterally with normal motor amplitudes for these nerves bilaterally.  The distal motor latencies and motor amplitudes for the ulnar nerves were normal bilaterally.  The nerve conduction velocities for the median and ulnar nerves were normal bilaterally.  The sensory latencies for the median nerves were prolonged bilaterally, normal for the ulnar nerves bilaterally.  The F-wave latencies for the ulnar nerves were within normal limits bilaterally.  EMG STUDIES:  EMG study was performed on the right upper extremity:  The first dorsal interosseous muscle reveals 2 to 4 K units with full recruitment. No fibrillations or positive waves were noted. The abductor pollicis brevis muscle reveals 2 to 4 K units with full recruitment. No fibrillations or positive waves were noted. The extensor indicis proprius muscle reveals 1 to 3 K units with full recruitment. No fibrillations or positive waves were noted. The pronator teres muscle reveals 2 to 3 K units with full recruitment. No fibrillations or positive waves were noted. The biceps muscle reveals 1 to 2 K units with full recruitment. No fibrillations or positive waves were noted. The triceps muscle reveals 2 to 4 K units with full recruitment. No fibrillations or positive waves were noted. The anterior deltoid muscle reveals 2 to 3 K units with full recruitment. No fibrillations or positive waves were noted. The cervical paraspinal muscles were tested at 2 levels. No  abnormalities of insertional activity were seen at either level tested. There was poor relaxation.   IMPRESSION:  Nerve conduction studies done on both upper extremities shows evidence of mild bilateral carpal tunnel syndrome, slightly worse on the right.  EMG evaluation of the right upper extremity was unremarkable, no evidence of an overlying cervical radiculopathy was seen.  Jill Alexanders MD 06/03/2018 2:48 PM  [facility] [address] [place], [place] [address]  Phone [phone] Fax [phone]

## 2018-06-03 NOTE — Progress Notes (Signed)
Please refer to EMG and nerve conduction procedure note.  

## 2018-06-03 NOTE — Progress Notes (Signed)
Call pt: EMG showed bilateral carpal tunnel with right worse than left. Would you like to be referred for surgery?

## 2018-06-03 NOTE — Progress Notes (Signed)
[place]    Nerve / Sites Muscle Latency Ref. Amplitude Ref. Rel Amp Segments Distance Velocity Ref. Area    ms ms mV mV %  cm m/s m/s mVms  L Median - APB     Wrist APB 4.6 ?4.4 6.5 ?4.0 100 Wrist - APB 7   18.9     Upper arm APB 8.6  6.1  92.8 Upper arm - Wrist 21 52 ?49 17.8  R Median - APB     Wrist APB 5.6 ?4.4 6.7 ?4.0 100 Wrist - APB 7   25.9     Upper arm APB 10.1  6.4  95.8 Upper arm - Wrist 22 50 ?49 26.7  L Ulnar - ADM     Wrist ADM 2.7 ?3.3 7.8 ?6.0 100 Wrist - ADM 7   30.3     B.Elbow ADM 6.1  7.5  96.1 B.Elbow - Wrist 19 55 ?49 30.9     A.Elbow ADM 7.6  6.7  89.5 A.Elbow - B.Elbow 10 69 ?49 26.9         A.Elbow - Wrist      R Ulnar - ADM     Wrist ADM 2.8 ?3.3 7.5 ?6.0 100 Wrist - ADM 7   23.9     B.Elbow ADM 6.1  7.7  103 B.Elbow - Wrist 19 58 ?49 26.5     A.Elbow ADM 7.8  6.5  83.8 A.Elbow - B.Elbow 10 58 ?49 22.7         A.Elbow - Wrist                 SNC    Nerve / Sites Rec. Site Peak Lat Ref.  Amp Ref. Segments Distance    ms ms V V  cm  L Median - Orthodromic (Dig II, Mid palm)     Dig II Wrist 4.6 ?3.4 7 ?10 Dig II - Wrist 13  R Median - Orthodromic (Dig II, Mid palm)     Dig II Wrist 5.7 ?3.4 3 ?10 Dig II - Wrist 13  L Ulnar - Orthodromic, (Dig V, Mid palm)     Dig V Wrist 2.6 ?3.1 6 ?5 Dig V - Wrist 11  R Ulnar - Orthodromic, (Dig V, Mid palm)     Dig V Wrist 3.1 ?3.1 7 ?5 Dig V - Wrist 52             F  Wave    Nerve F Lat Ref.   ms ms  L Ulnar - ADM 26.1 ?32.0  R Ulnar - ADM 26.9 ?32.0         EMG full

## 2018-06-04 ENCOUNTER — Encounter: Payer: Self-pay | Admitting: Physician Assistant

## 2018-06-04 ENCOUNTER — Other Ambulatory Visit: Payer: Self-pay

## 2018-06-04 DIAGNOSIS — G2581 Restless legs syndrome: Secondary | ICD-10-CM

## 2018-06-04 MED ORDER — ROPINIROLE HCL 0.25 MG PO TABS: ORAL_TABLET | ORAL | 5 refills | Status: DC

## 2018-06-04 NOTE — Telephone Encounter (Signed)
[facility] sent over a paper refill request today for Requip. I just wanted to double check with you first to see if it is okay to be refilled. Medication has been pended. Thanks!

## 2018-06-07 ENCOUNTER — Other Ambulatory Visit: Payer: Self-pay | Admitting: Physician Assistant

## 2018-06-07 DIAGNOSIS — G2581 Restless legs syndrome: Secondary | ICD-10-CM

## 2018-06-08 DIAGNOSIS — H52209 Unspecified astigmatism, unspecified eye: Secondary | ICD-10-CM | POA: Diagnosis not present

## 2018-06-08 DIAGNOSIS — H5203 Hypermetropia, bilateral: Secondary | ICD-10-CM | POA: Diagnosis not present

## 2018-06-10 ENCOUNTER — Encounter: Payer: Self-pay | Admitting: Physician Assistant

## 2018-06-10 DIAGNOSIS — G5603 Carpal tunnel syndrome, bilateral upper limbs: Secondary | ICD-10-CM

## 2018-06-10 NOTE — Telephone Encounter (Signed)
Referral placed.

## 2018-06-10 NOTE — Telephone Encounter (Signed)
It is for the bilateral carpal tunnel that EMG were done. Ok to make carpal tunnel surgery referral.   Ann Griffith can we find her one in network.

## 2018-06-16 ENCOUNTER — Telehealth: Payer: Self-pay | Admitting: Physician Assistant

## 2018-06-16 DIAGNOSIS — G5601 Carpal tunnel syndrome, right upper limb: Secondary | ICD-10-CM | POA: Diagnosis not present

## 2018-06-16 DIAGNOSIS — G5602 Carpal tunnel syndrome, left upper limb: Secondary | ICD-10-CM | POA: Diagnosis not present

## 2018-06-16 DIAGNOSIS — M1811 Unilateral primary osteoarthritis of first carpometacarpal joint, right hand: Secondary | ICD-10-CM | POA: Diagnosis not present

## 2018-06-16 NOTE — Telephone Encounter (Signed)
Received fax from [employer] that Diclofenac Gel requires a PA. Information has been sent to the insurance company. Awaiting determination.

## 2018-06-17 NOTE — Telephone Encounter (Signed)
Received fax from insurance that Diclofenac gel was denied. From sent to scan.

## 2018-07-03 DIAGNOSIS — Z789 Other specified health status: Secondary | ICD-10-CM | POA: Insufficient documentation

## 2018-07-04 ENCOUNTER — Other Ambulatory Visit: Payer: Self-pay | Admitting: Physician Assistant

## 2018-07-04 DIAGNOSIS — E118 Type 2 diabetes mellitus with unspecified complications: Secondary | ICD-10-CM

## 2018-07-07 ENCOUNTER — Other Ambulatory Visit: Payer: Self-pay | Admitting: Family Medicine

## 2018-07-23 ENCOUNTER — Other Ambulatory Visit: Payer: Self-pay

## 2018-07-23 DIAGNOSIS — E118 Type 2 diabetes mellitus with unspecified complications: Secondary | ICD-10-CM

## 2018-07-23 MED ORDER — METFORMIN HCL 500 MG PO TABS
ORAL_TABLET | ORAL | 0 refills | Status: DC
Start: 2018-07-23 — End: 2018-08-18

## 2018-07-27 ENCOUNTER — Encounter: Payer: Self-pay | Admitting: Physician Assistant

## 2018-07-27 ENCOUNTER — Ambulatory Visit: Payer: Medicare HMO | Admitting: Physician Assistant

## 2018-07-27 VITALS — BP 123/69 | HR 75 | Ht 67.0 in | Wt 173.0 lb

## 2018-07-27 DIAGNOSIS — M25571 Pain in right ankle and joints of right foot: Secondary | ICD-10-CM

## 2018-07-27 DIAGNOSIS — M25562 Pain in left knee: Secondary | ICD-10-CM | POA: Diagnosis not present

## 2018-07-27 DIAGNOSIS — R69 Illness, unspecified: Secondary | ICD-10-CM | POA: Diagnosis not present

## 2018-07-27 DIAGNOSIS — E782 Mixed hyperlipidemia: Secondary | ICD-10-CM

## 2018-07-27 DIAGNOSIS — G8929 Other chronic pain: Secondary | ICD-10-CM | POA: Diagnosis not present

## 2018-07-27 DIAGNOSIS — R809 Proteinuria, unspecified: Secondary | ICD-10-CM | POA: Diagnosis not present

## 2018-07-27 DIAGNOSIS — F5101 Primary insomnia: Secondary | ICD-10-CM

## 2018-07-27 DIAGNOSIS — I10 Essential (primary) hypertension: Secondary | ICD-10-CM | POA: Diagnosis not present

## 2018-07-27 DIAGNOSIS — M25561 Pain in right knee: Secondary | ICD-10-CM | POA: Diagnosis not present

## 2018-07-27 DIAGNOSIS — E1169 Type 2 diabetes mellitus with other specified complication: Secondary | ICD-10-CM | POA: Diagnosis not present

## 2018-07-27 DIAGNOSIS — E118 Type 2 diabetes mellitus with unspecified complications: Secondary | ICD-10-CM

## 2018-07-27 DIAGNOSIS — M25572 Pain in left ankle and joints of left foot: Secondary | ICD-10-CM

## 2018-07-27 LAB — POCT GLYCOSYLATED HEMOGLOBIN (HGB A1C): Hemoglobin A1C: 6.3 % — AB (ref 4.0–5.6)

## 2018-07-27 MED ORDER — TRAZODONE HCL 50 MG PO TABS: 50.0000 mg | ORAL_TABLET | Freq: Every day | ORAL | 2 refills | Status: DC

## 2018-07-27 MED ORDER — DICLOFENAC SODIUM 75 MG PO TBEC: 75.0000 mg | DELAYED_RELEASE_TABLET | Freq: Two times a day (BID) | ORAL | 2 refills | Status: DC

## 2018-07-27 NOTE — Progress Notes (Signed)
Subjective:    Patient ID: Ann Griffith, female    DOB: 1951-06-15, 67 y.o.   MRN: [id number]  HPI  Pt is a 67 yo female with T2DM, HTN, RLS who presents to the clinic for 3 month follow up.   DM- not checking sugars. Denies any hypoglycemia. No open sores or wounds. She has lost 3lbs. She is taking metformin daily.   HTN- no problems or concerns. No CP, palpitaitons, vision changes, headaches.   She has carpal tunnel release surgery on Friday.   She continues to have OA joint pain in knees and ankles. At times in hips as well. She is interested in PT to help with pain. voltaren gel helps but she is using 4 times a day and would like to use more.   She continues to have a lot of problems sleeping. She has tried tylenol pm, unisome, melatonin with no help.   .. Active Ambulatory Problems    Diagnosis Date Noted  . Diabetes mellitus, type II ([place]) 11/09/2007  . 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 CHOLELITHIASIS, HX OF 11/09/2007  . Diverticulosis 06/07/2013  . Raynaud's syndrome 08/09/2013  . BPPV (benign paroxysmal positional vertigo), right 11/02/2014  . Microalbuminuria 11/02/2014  . Seborrheic dermatitis 03/14/2015  . Memory changes 05/05/2015  . Insomnia 08/08/2015  . Right leg pain 08/08/2015  . Shoulder bursitis 08/08/2015  . Avitaminosis D 10/09/2012  . Colon polyp 04/24/2011  . Tennis elbow syndrome 03/29/2016  . RLS (restless legs syndrome) 03/29/2016  . Right shoulder pain 03/29/2016  . 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 Cataract cortical, senile, left 06/04/2016  . Elevated serum creatinine 12/10/2016  . 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 Trigger finger, left ring finger 06/26/2017  . Chronic pain of both knees 06/26/2017  . Carpal tunnel syndrome, right 06/26/2017  . Dry skin dermatitis 06/26/2017  . Trochanteric bursitis 06/27/2017  . Flatulence 09/28/2017  . Fear of flying 09/28/2017  . Orthostatic  hypotension 12/16/2017  . Syncope and collapse 12/16/2017  . Mitral regurgitation 12/19/2017  . Tricuspid regurgitation 12/19/2017  . Raynaud's phenomenon without gangrene 02/03/2018  . Concussion 12/08/2017  . Essential hypertension 12/10/2017  . Combined hyperlipidemia associated with type 2 diabetes mellitus ([place]) 04/24/2011  . Itching 04/27/2018  . Chronic pain of both ankles 04/27/2018  . Bilateral carpal tunnel syndrome 04/27/2018  . Statin intolerance 07/03/2018   Resolved Ambulatory Problems    Diagnosis Date Noted  . Eustachian tube dysfunction 05/02/2014  . Hyperlipidemia 11/03/2014  . Controlled type 2 diabetes mellitus without complication, without long-term current use of insulin ([place]) 06/26/2017  . Nausea 12/16/2017  . Hematoma and contusion 12/16/2017   Past Medical History:  Diagnosis Date  . DIABETES MELLITUS, TYPE II 11/09/2007     Review of Systems See HPI.     Objective:   Physical Exam Constitutional:      Appearance: Normal appearance.  HENT:     Head: Normocephalic and atraumatic.  Cardiovascular:     Rate and Rhythm: Normal rate and regular rhythm.  Pulmonary:     Effort: Pulmonary effort is normal.     Breath sounds: Normal breath sounds.  Neurological:     General: No focal deficit present.     Mental Status: She is alert and oriented to person, place, and time.  Psychiatric:        Mood and Affect: Mood normal.        Behavior: Behavior normal.  Assessment & Plan:  [name] was seen today for follow-up.  Diagnoses and all orders for this visit:  Type 2 diabetes mellitus with complication, without long-term current use of insulin (HCC) -     POCT glycosylated hemoglobin (Hb A1C)  Essential hypertension  Combined hyperlipidemia associated with type 2 diabetes mellitus (HCC)  Microalbuminuria  Chronic pain of both knees -     diclofenac (VOLTAREN) 75 MG EC tablet; Take 1 tablet (75 mg total) by mouth 2 (two) times  daily. -     Ambulatory referral to Physical Therapy  Chronic pain of both ankles -     diclofenac (VOLTAREN) 75 MG EC tablet; Take 1 tablet (75 mg total) by mouth 2 (two) times daily. -     Ambulatory referral to Physical Therapy  Primary insomnia -     traZODone (DESYREL) 50 MG tablet; Take 1 tablet (50 mg total) by mouth at bedtime.   .. Results for orders placed or performed in visit on 07/27/18  POCT glycosylated hemoglobin (Hb A1C)  Result Value Ref Range   Hemoglobin A1C 6.3 (A) 4.0 - 5.6 %   HbA1c POC (<> result, manual entry)     HbA1c, POC (prediabetic range)     HbA1c, POC (controlled diabetic range)     On ace.BP controlled.  On statin.  A1C controlled.  Stay on same medications.  Follow up in 3 months.  Flu and pneumonia vaccines up to date.   Added oral diclofenac to use as needed with voltaren gel. Discussed PT to help with muscles surrounding joint OA.   Added trazodone to help with sleep. Discussed side effects. Follow up in 3 months.

## 2018-07-28 ENCOUNTER — Encounter: Payer: Self-pay | Admitting: Physician Assistant

## 2018-07-29 ENCOUNTER — Other Ambulatory Visit: Payer: Self-pay

## 2018-07-29 DIAGNOSIS — E118 Type 2 diabetes mellitus with unspecified complications: Secondary | ICD-10-CM

## 2018-07-29 MED ORDER — LISINOPRIL 2.5 MG PO TABS: 2.5000 mg | ORAL_TABLET | Freq: Every day | ORAL | 1 refills | Status: DC

## 2018-07-31 DIAGNOSIS — G5601 Carpal tunnel syndrome, right upper limb: Secondary | ICD-10-CM | POA: Diagnosis not present

## 2018-08-13 DIAGNOSIS — M1811 Unilateral primary osteoarthritis of first carpometacarpal joint, right hand: Secondary | ICD-10-CM | POA: Diagnosis not present

## 2018-08-13 DIAGNOSIS — G5601 Carpal tunnel syndrome, right upper limb: Secondary | ICD-10-CM | POA: Diagnosis not present

## 2018-08-18 ENCOUNTER — Other Ambulatory Visit: Payer: Self-pay | Admitting: Physician Assistant

## 2018-08-18 DIAGNOSIS — F5101 Primary insomnia: Secondary | ICD-10-CM

## 2018-08-18 DIAGNOSIS — E118 Type 2 diabetes mellitus with unspecified complications: Secondary | ICD-10-CM

## 2018-08-18 NOTE — Telephone Encounter (Signed)
Pt requesting RF on Metformin.   Med list states pt is taking 2 tabs BID, but last office note states she is only taking 2 tabs QAM.   RX pended for 2 tabs QAM, please send if ok.

## 2018-08-26 ENCOUNTER — Ambulatory Visit: Payer: Medicare HMO | Admitting: Physical Therapy

## 2018-09-22 DIAGNOSIS — K5730 Diverticulosis of large intestine without perforation or abscess without bleeding: Secondary | ICD-10-CM | POA: Diagnosis not present

## 2018-09-22 DIAGNOSIS — Z86010 Personal history of colonic polyps: Secondary | ICD-10-CM | POA: Diagnosis not present

## 2018-09-22 DIAGNOSIS — K640 First degree hemorrhoids: Secondary | ICD-10-CM | POA: Diagnosis not present

## 2018-09-22 DIAGNOSIS — K621 Rectal polyp: Secondary | ICD-10-CM | POA: Diagnosis not present

## 2018-09-25 DIAGNOSIS — K621 Rectal polyp: Secondary | ICD-10-CM | POA: Diagnosis not present

## 2018-09-29 ENCOUNTER — Encounter: Payer: Self-pay | Admitting: Obstetrics & Gynecology

## 2018-09-29 DIAGNOSIS — N6489 Other specified disorders of breast: Secondary | ICD-10-CM | POA: Diagnosis not present

## 2018-10-18 ENCOUNTER — Other Ambulatory Visit: Payer: Self-pay | Admitting: Physician Assistant

## 2018-10-18 DIAGNOSIS — F5101 Primary insomnia: Secondary | ICD-10-CM

## 2018-10-18 DIAGNOSIS — M25561 Pain in right knee: Secondary | ICD-10-CM

## 2018-10-18 DIAGNOSIS — M25571 Pain in right ankle and joints of right foot: Secondary | ICD-10-CM

## 2018-10-18 DIAGNOSIS — G2581 Restless legs syndrome: Secondary | ICD-10-CM

## 2018-10-18 DIAGNOSIS — M25562 Pain in left knee: Secondary | ICD-10-CM

## 2018-10-18 DIAGNOSIS — G8929 Other chronic pain: Secondary | ICD-10-CM

## 2018-10-18 DIAGNOSIS — M25572 Pain in left ankle and joints of left foot: Secondary | ICD-10-CM

## 2018-10-19 DIAGNOSIS — Z411 Encounter for cosmetic surgery: Secondary | ICD-10-CM | POA: Diagnosis not present

## 2018-10-19 DIAGNOSIS — L814 Other melanin hyperpigmentation: Secondary | ICD-10-CM | POA: Diagnosis not present

## 2018-10-19 DIAGNOSIS — D225 Melanocytic nevi of trunk: Secondary | ICD-10-CM | POA: Diagnosis not present

## 2018-10-19 DIAGNOSIS — Z23 Encounter for immunization: Secondary | ICD-10-CM | POA: Diagnosis not present

## 2018-10-19 DIAGNOSIS — L309 Dermatitis, unspecified: Secondary | ICD-10-CM | POA: Diagnosis not present

## 2018-10-19 DIAGNOSIS — L821 Other seborrheic keratosis: Secondary | ICD-10-CM | POA: Diagnosis not present

## 2018-10-20 ENCOUNTER — Other Ambulatory Visit: Payer: Self-pay | Admitting: Physician Assistant

## 2018-10-20 DIAGNOSIS — I7300 Raynaud's syndrome without gangrene: Secondary | ICD-10-CM

## 2018-10-26 ENCOUNTER — Ambulatory Visit (INDEPENDENT_AMBULATORY_CARE_PROVIDER_SITE_OTHER): Payer: Medicare HMO | Admitting: Physician Assistant

## 2018-10-26 ENCOUNTER — Other Ambulatory Visit: Payer: Self-pay

## 2018-10-26 ENCOUNTER — Encounter: Payer: Self-pay | Admitting: Physician Assistant

## 2018-10-26 VITALS — BP 130/78 | HR 62 | Resp 16 | Ht 67.0 in | Wt 172.0 lb

## 2018-10-26 DIAGNOSIS — M25561 Pain in right knee: Secondary | ICD-10-CM | POA: Diagnosis not present

## 2018-10-26 DIAGNOSIS — I7300 Raynaud's syndrome without gangrene: Secondary | ICD-10-CM

## 2018-10-26 DIAGNOSIS — G2581 Restless legs syndrome: Secondary | ICD-10-CM | POA: Diagnosis not present

## 2018-10-26 DIAGNOSIS — R69 Illness, unspecified: Secondary | ICD-10-CM | POA: Diagnosis not present

## 2018-10-26 DIAGNOSIS — G8929 Other chronic pain: Secondary | ICD-10-CM

## 2018-10-26 DIAGNOSIS — H8111 Benign paroxysmal vertigo, right ear: Secondary | ICD-10-CM | POA: Diagnosis not present

## 2018-10-26 DIAGNOSIS — M25562 Pain in left knee: Secondary | ICD-10-CM | POA: Diagnosis not present

## 2018-10-26 DIAGNOSIS — M25571 Pain in right ankle and joints of right foot: Secondary | ICD-10-CM

## 2018-10-26 DIAGNOSIS — M25572 Pain in left ankle and joints of left foot: Secondary | ICD-10-CM | POA: Diagnosis not present

## 2018-10-26 DIAGNOSIS — E118 Type 2 diabetes mellitus with unspecified complications: Secondary | ICD-10-CM

## 2018-10-26 DIAGNOSIS — F5101 Primary insomnia: Secondary | ICD-10-CM

## 2018-10-26 LAB — POCT GLYCOSYLATED HEMOGLOBIN (HGB A1C): Hemoglobin A1C: 6.3 % — AB (ref 4.0–5.6)

## 2018-10-26 MED ORDER — LISINOPRIL 2.5 MG PO TABS: 2.5000 mg | ORAL_TABLET | Freq: Every day | ORAL | 3 refills | Status: DC

## 2018-10-26 MED ORDER — MECLIZINE HCL 25 MG PO TABS: 25.0000 mg | ORAL_TABLET | Freq: Three times a day (TID) | ORAL | 0 refills | Status: DC | PRN

## 2018-10-26 MED ORDER — AMLODIPINE BESYLATE 2.5 MG PO TABS: 2.5000 mg | ORAL_TABLET | Freq: Every day | ORAL | 1 refills | Status: DC

## 2018-10-26 MED ORDER — TRAZODONE HCL 50 MG PO TABS: ORAL_TABLET | ORAL | 3 refills | Status: DC

## 2018-10-26 MED ORDER — DICLOFENAC SODIUM 75 MG PO TBEC: 75.0000 mg | DELAYED_RELEASE_TABLET | Freq: Two times a day (BID) | ORAL | 1 refills | Status: DC

## 2018-10-26 MED ORDER — ROPINIROLE HCL 0.25 MG PO TABS: ORAL_TABLET | ORAL | 1 refills | Status: DC

## 2018-10-26 NOTE — Progress Notes (Signed)
Subjective:    Patient ID: Ann Griffith, female    DOB: 1950-09-14, 68 y.o.   MRN: [id number]  HPI  Pt is a 68 yo female with T2DM, chronic knee pain due to arthritis, insomnia, RLS who presents to the clinic for 3 month follow up.   Pt' last A!C was Lab Results  Component Value Date   HGBA1C 6.3 (A) 10/26/2018  pt is doing well with no complaints. No hypoglycemia.   Pt has arthritis is both knees. She is using voltaren gel and diclofenac. It does help. At times she has some pain in bilateral thighs. Right seems to be worse than left.   RLS doing well with 4 tablets at bedtime.   Insomnia doing well with trazodone.   Hx of recurrent BPPV. epley maneuvers making her really sick to her stomach.  .. Active Ambulatory Problems    Diagnosis Date Noted  . Diabetes mellitus, type II ([place]) 11/09/2007  . 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 CHOLELITHIASIS, HX OF 11/09/2007  . Diverticulosis 06/07/2013  . Raynaud's syndrome 08/09/2013  . BPPV (benign paroxysmal positional vertigo), right 11/02/2014  . Microalbuminuria 11/02/2014  . Seborrheic dermatitis 03/14/2015  . Memory changes 05/05/2015  . Insomnia 08/08/2015  . Right leg pain 08/08/2015  . Shoulder bursitis 08/08/2015  . Avitaminosis D 10/09/2012  . Colon polyp 04/24/2011  . Tennis elbow syndrome 03/29/2016  . RLS (restless legs syndrome) 03/29/2016  . Right shoulder pain 03/29/2016  . 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 Cataract cortical, senile, left 06/04/2016  . Elevated serum creatinine 12/10/2016  . 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 Trigger finger, left ring finger 06/26/2017  . Chronic pain of both knees 06/26/2017  . Carpal tunnel syndrome, right 06/26/2017  . Dry skin dermatitis 06/26/2017  . Trochanteric bursitis 06/27/2017  . Flatulence 09/28/2017  . Fear of flying 09/28/2017  . Orthostatic hypotension 12/16/2017  . Syncope and collapse 12/16/2017  .  Mitral regurgitation 12/19/2017  . Tricuspid regurgitation 12/19/2017  . Raynaud's phenomenon without gangrene 02/03/2018  . Concussion 12/08/2017  . Essential hypertension 12/10/2017  . Combined hyperlipidemia associated with type 2 diabetes mellitus ([place]) 04/24/2011  . Itching 04/27/2018  . Chronic pain of both ankles 04/27/2018  . Bilateral carpal tunnel syndrome 04/27/2018  . Statin intolerance 07/03/2018   Resolved Ambulatory Problems    Diagnosis Date Noted  . Eustachian tube dysfunction 05/02/2014  . Hyperlipidemia 11/03/2014  . Controlled type 2 diabetes mellitus without complication, without long-term current use of insulin ([place]) 06/26/2017  . Nausea 12/16/2017  . Hematoma and contusion 12/16/2017   Past Medical History:  Diagnosis Date  . DIABETES MELLITUS, TYPE II 11/09/2007    Review of Systems    see HPI.  Objective:   Physical Exam Vitals signs reviewed.  Constitutional:      Appearance: Normal appearance.  HENT:     Head: Normocephalic and atraumatic.     Right Ear: Tympanic membrane normal.     Left Ear: Tympanic membrane normal.     Nose: Nose normal.  Eyes:     Conjunctiva/sclera: Conjunctivae normal.  Neck:     Musculoskeletal: Normal range of motion.  Cardiovascular:     Rate and Rhythm: Normal rate and regular rhythm.     Pulses: Normal pulses.  Pulmonary:     Effort: Pulmonary effort is normal.     Breath sounds: Normal breath sounds.  Neurological:     General: No focal deficit present.  Mental Status: She is alert and oriented to person, place, and time.  Psychiatric:        Mood and Affect: Mood normal.           Assessment & Plan:    [name] was seen today for follow-up.  Diagnoses and all orders for this visit:  Type 2 diabetes mellitus with complication, without long-term current use of insulin (HCC) -     POCT HgB A1C -     lisinopril (PRINIVIL,ZESTRIL) 2.5 MG tablet; Take 1 tablet (2.5 mg total) by mouth  daily.  Chronic pain of both knees -     diclofenac (VOLTAREN) 75 MG EC tablet; Take 1 tablet (75 mg total) by mouth 2 (two) times daily.  Chronic pain of both ankles -     diclofenac (VOLTAREN) 75 MG EC tablet; Take 1 tablet (75 mg total) by mouth 2 (two) times daily.  Raynaud's phenomenon without gangrene -     amLODipine (NORVASC) 2.5 MG tablet; Take 1-2 tablets (2.5-5 mg total) by mouth daily.  Primary insomnia -     traZODone (DESYREL) 50 MG tablet; TAKE 1 TABLET BY MOUTH EVERYDAY AT BEDTIME  RLS (restless legs syndrome) -     rOPINIRole (REQUIP) 0.25 MG tablet; Take 1-4 tablets by mouth at bedtime.  BPPV (benign paroxysmal positional vertigo), right  Other orders -     meclizine (ANTIVERT) 25 MG tablet; Take 1 tablet (25 mg total) by mouth 3 (three) times daily as needed for dizziness.    .. Lab Results  Component Value Date   HGBA1C 6.3 (A) 10/26/2018   A!C unchanged.  Stay on same medication.  On ACE. BP better on 2nd recheck.  Pt is statin intolerant.  Flu and pneumonia vaccines up to date.  Follow up in 3 months.   Continue voltaren gel and diclofenac for knee pain. Ice as needed. Consider injections if pain persists with Dr. Georgina Snell.  Ok for tens unit could help surrounding muscles.   antivert and epley manuevers given for vertigo. Could try flonase. If not improving follow up.   Needs refills on other controlled medications.

## 2019-01-14 ENCOUNTER — Other Ambulatory Visit: Payer: Self-pay | Admitting: Physician Assistant

## 2019-01-14 DIAGNOSIS — E118 Type 2 diabetes mellitus with unspecified complications: Secondary | ICD-10-CM

## 2019-01-26 ENCOUNTER — Ambulatory Visit: Payer: Medicare HMO | Admitting: Physician Assistant

## 2019-01-27 ENCOUNTER — Ambulatory Visit (INDEPENDENT_AMBULATORY_CARE_PROVIDER_SITE_OTHER): Payer: Medicare HMO | Admitting: Physician Assistant

## 2019-01-27 ENCOUNTER — Encounter: Payer: Self-pay | Admitting: Physician Assistant

## 2019-01-27 VITALS — BP 135/70 | HR 74 | Temp 98.4°F | Ht 66.5 in | Wt 171.0 lb

## 2019-01-27 DIAGNOSIS — E118 Type 2 diabetes mellitus with unspecified complications: Secondary | ICD-10-CM

## 2019-01-27 DIAGNOSIS — E1169 Type 2 diabetes mellitus with other specified complication: Secondary | ICD-10-CM

## 2019-01-27 DIAGNOSIS — E782 Mixed hyperlipidemia: Secondary | ICD-10-CM | POA: Diagnosis not present

## 2019-01-27 DIAGNOSIS — I340 Nonrheumatic mitral (valve) insufficiency: Secondary | ICD-10-CM | POA: Diagnosis not present

## 2019-01-27 DIAGNOSIS — I10 Essential (primary) hypertension: Secondary | ICD-10-CM | POA: Diagnosis not present

## 2019-01-27 DIAGNOSIS — G2581 Restless legs syndrome: Secondary | ICD-10-CM

## 2019-01-27 DIAGNOSIS — I361 Nonrheumatic tricuspid (valve) insufficiency: Secondary | ICD-10-CM | POA: Diagnosis not present

## 2019-01-27 LAB — POCT GLYCOSYLATED HEMOGLOBIN (HGB A1C): Hemoglobin A1C: 6.5 % — AB (ref 4.0–5.6)

## 2019-01-27 MED ORDER — METFORMIN HCL 500 MG PO TABS: ORAL_TABLET | ORAL | 3 refills | Status: DC

## 2019-01-27 NOTE — Progress Notes (Signed)
64

## 2019-01-27 NOTE — Progress Notes (Signed)
Subjective:    Patient ID: Ann Griffith, female    DOB: October 21, 1950, 68 y.o.   MRN: [id number]  HPI Pt is a 68 yo female with T2DM, HTN, HLD, RLS who presents to the clinic for 3 month follow up. She is doing really well. No problems or concerns.   She is not checking sugars. She denies any hypoglycemic events. She is on metformin but not always taking twice a day. She is very active but no exercise.   Pt denies any CP, palpitations, headaches or vision changes.   Her RLS symptoms at night and doing well.   .. Active Ambulatory Problems    Diagnosis Date Noted  . Diabetes mellitus, type II ([place]) 11/09/2007  . 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 CHOLELITHIASIS, HX OF 11/09/2007  . Diverticulosis 06/07/2013  . Raynaud's syndrome 08/09/2013  . BPPV (benign paroxysmal positional vertigo), right 11/02/2014  . Microalbuminuria 11/02/2014  . Seborrheic dermatitis 03/14/2015  . Memory changes 05/05/2015  . Insomnia 08/08/2015  . Right leg pain 08/08/2015  . Shoulder bursitis 08/08/2015  . Avitaminosis D 10/09/2012  . Colon polyp 04/24/2011  . Tennis elbow syndrome 03/29/2016  . RLS (restless legs syndrome) 03/29/2016  . Right shoulder pain 03/29/2016  . 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 Cataract cortical, senile, left 06/04/2016  . Elevated serum creatinine 12/10/2016  . 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 Trigger finger, left ring finger 06/26/2017  . Chronic pain of both knees 06/26/2017  . Carpal tunnel syndrome, right 06/26/2017  . Dry skin dermatitis 06/26/2017  . Trochanteric bursitis 06/27/2017  . Flatulence 09/28/2017  . Fear of flying 09/28/2017  . Orthostatic hypotension 12/16/2017  . Syncope and collapse 12/16/2017  . Mitral regurgitation 12/19/2017  . Tricuspid regurgitation 12/19/2017  . Raynaud's phenomenon without gangrene 02/03/2018  . Concussion 12/08/2017  . Essential hypertension 12/10/2017  . Combined  hyperlipidemia associated with type 2 diabetes mellitus ([place]) 04/24/2011  . Itching 04/27/2018  . Chronic pain of both ankles 04/27/2018  . Bilateral carpal tunnel syndrome 04/27/2018  . Statin intolerance 07/03/2018   Resolved Ambulatory Problems    Diagnosis Date Noted  . Eustachian tube dysfunction 05/02/2014  . Hyperlipidemia 11/03/2014  . Controlled type 2 diabetes mellitus without complication, without long-term current use of insulin ([place]) 06/26/2017  . Nausea 12/16/2017  . Hematoma and contusion 12/16/2017   Past Medical History:  Diagnosis Date  . DIABETES MELLITUS, TYPE II 11/09/2007      Review of Systems  All other systems reviewed and are negative.      Objective:   Physical Exam Vitals signs reviewed.  Constitutional:      Appearance: Normal appearance.  HENT:     Head: Normocephalic and atraumatic.  Cardiovascular:     Rate and Rhythm: Normal rate and regular rhythm.     Heart sounds: Murmur present.  Pulmonary:     Effort: Pulmonary effort is normal.     Breath sounds: Normal breath sounds.  Neurological:     General: No focal deficit present.     Mental Status: She is alert and oriented to person, place, and time.  Psychiatric:        Mood and Affect: Mood normal.        Behavior: Behavior normal.           Assessment & Plan:  [name] was seen today for diabetes.  Diagnoses and all orders for this visit:  Type 2 diabetes mellitus with complication, without  long-term current use of insulin (HCC) -     POCT glycosylated hemoglobin (Hb A1C) -     metFORMIN (GLUCOPHAGE) 500 MG tablet; TAKE 2 TABLETS BY MOUTH EVERY DAY WITH BREAKFAST -     COMPLETE METABOLIC PANEL WITH GFR  Essential hypertension -     COMPLETE METABOLIC PANEL WITH GFR  Nonrheumatic mitral valve regurgitation  Nonrheumatic tricuspid valve regurgitation  Combined hyperlipidemia associated with type 2 diabetes mellitus (HCC) -     Lipid Panel w/reflex Direct LDL  RLS  (restless legs syndrome)   .[name] Results for orders placed or performed in visit on 01/27/19  POCT glycosylated hemoglobin (Hb A1C)  Result Value Ref Range   Hemoglobin A1C 6.5 (A) 4.0 - 5.6 %   HbA1c POC (<> result, manual entry)     HbA1c, POC (prediabetic range)     HbA1c, POC (controlled diabetic range)     6.5 from 6.3. Continue on metformin. On ACE.BP good. On statin. LDL to recheck.  Vaccines up to date.  Eye exam up to date.  Follow up in 3 months.

## 2019-02-06 ENCOUNTER — Other Ambulatory Visit: Payer: Self-pay | Admitting: Physician Assistant

## 2019-02-06 DIAGNOSIS — I7300 Raynaud's syndrome without gangrene: Secondary | ICD-10-CM

## 2019-02-15 DIAGNOSIS — E118 Type 2 diabetes mellitus with unspecified complications: Secondary | ICD-10-CM | POA: Diagnosis not present

## 2019-02-15 DIAGNOSIS — E782 Mixed hyperlipidemia: Secondary | ICD-10-CM | POA: Diagnosis not present

## 2019-02-15 DIAGNOSIS — E1169 Type 2 diabetes mellitus with other specified complication: Secondary | ICD-10-CM | POA: Diagnosis not present

## 2019-02-15 DIAGNOSIS — I10 Essential (primary) hypertension: Secondary | ICD-10-CM | POA: Diagnosis not present

## 2019-02-16 LAB — LIPID PANEL W/REFLEX DIRECT LDL
Cholesterol: 211 mg/dL — ABNORMAL HIGH (ref <200)
HDL: 79 mg/dL (ref >=50)
LDL Cholesterol (Calc): 117 mg/dL (calc) — ABNORMAL HIGH
Non-HDL Cholesterol (Calc): 132 mg/dL (calc) — ABNORMAL HIGH (ref <130)
Total CHOL/HDL Ratio: 2.7 (calc) (ref <5.0)
Triglycerides: 60 mg/dL (ref <150)

## 2019-02-16 LAB — COMPLETE METABOLIC PANEL WITH GFR
AG Ratio: 1.7 (calc) (ref 1.0–2.5)
ALT: 19 U/L (ref 6–29)
AST: 18 U/L (ref 10–35)
Albumin: 4.3 g/dL (ref 3.6–5.1)
Alkaline phosphatase (APISO): 48 U/L (ref 37–153)
BUN/Creatinine Ratio: 15 (calc) (ref 6–22)
BUN: 16 mg/dL (ref 7–25)
CO2: 25 mmol/L (ref 20–32)
Calcium: 9.5 mg/dL (ref 8.6–10.4)
Chloride: 105 mmol/L (ref 98–110)
Creat: 1.05 mg/dL — ABNORMAL HIGH (ref 0.50–0.99)
GFR, Est African American: 63 mL/min/{1.73_m2} (ref >=60)
GFR, Est Non African American: 54 mL/min/{1.73_m2} — ABNORMAL LOW (ref >=60)
Globulin: 2.6 g/dL (calc) (ref 1.9–3.7)
Glucose, Bld: 136 mg/dL — ABNORMAL HIGH (ref 65–99)
Potassium: 4.2 mmol/L (ref 3.5–5.3)
Sodium: 138 mmol/L (ref 135–146)
Total Bilirubin: 0.4 mg/dL (ref 0.2–1.2)
Total Protein: 6.9 g/dL (ref 6.1–8.1)

## 2019-02-16 NOTE — Progress Notes (Signed)
LDL up from one year ago. Goal is under 70. You are not at goal. I suggest that we should change zocor to a stronger cholesterol medication. Just increasing zocor can come with some potential side effects and not recommended.    Kidney function looks great.   I can send that to the pharmacy.

## 2019-02-22 MED ORDER — ROSUVASTATIN CALCIUM 20 MG PO TABS: 20.0000 mg | ORAL_TABLET | Freq: Every day | ORAL | 3 refills | Status: DC

## 2019-02-22 NOTE — Addendum Note (Signed)
Addended by: Donella Stade on: 02/22/2019 07:50 AM   Modules accepted: Orders

## 2019-02-22 NOTE — Progress Notes (Signed)
Sent crestor 20mg  daily.

## 2019-03-13 ENCOUNTER — Other Ambulatory Visit: Payer: Self-pay | Admitting: Physician Assistant

## 2019-03-13 DIAGNOSIS — M25571 Pain in right ankle and joints of right foot: Secondary | ICD-10-CM

## 2019-03-13 DIAGNOSIS — M25562 Pain in left knee: Secondary | ICD-10-CM

## 2019-03-13 DIAGNOSIS — G8929 Other chronic pain: Secondary | ICD-10-CM

## 2019-03-15 ENCOUNTER — Other Ambulatory Visit: Payer: Self-pay | Admitting: Physician Assistant

## 2019-03-15 DIAGNOSIS — G2581 Restless legs syndrome: Secondary | ICD-10-CM

## 2019-03-19 ENCOUNTER — Other Ambulatory Visit: Payer: Self-pay | Admitting: Family Medicine

## 2019-03-30 ENCOUNTER — Encounter: Payer: Self-pay | Admitting: Obstetrics & Gynecology

## 2019-03-30 DIAGNOSIS — Z1231 Encounter for screening mammogram for malignant neoplasm of breast: Secondary | ICD-10-CM | POA: Diagnosis not present

## 2019-04-06 ENCOUNTER — Ambulatory Visit (INDEPENDENT_AMBULATORY_CARE_PROVIDER_SITE_OTHER): Payer: Medicare HMO

## 2019-04-06 ENCOUNTER — Ambulatory Visit (INDEPENDENT_AMBULATORY_CARE_PROVIDER_SITE_OTHER): Payer: Medicare HMO | Admitting: Family Medicine

## 2019-04-06 ENCOUNTER — Other Ambulatory Visit: Payer: Self-pay

## 2019-04-06 VITALS — BP 133/75 | HR 73 | Temp 97.9°F | Wt 175.0 lb

## 2019-04-06 DIAGNOSIS — M25562 Pain in left knee: Secondary | ICD-10-CM

## 2019-04-06 DIAGNOSIS — M79652 Pain in left thigh: Secondary | ICD-10-CM

## 2019-04-06 DIAGNOSIS — I10 Essential (primary) hypertension: Secondary | ICD-10-CM | POA: Diagnosis not present

## 2019-04-06 DIAGNOSIS — G8929 Other chronic pain: Secondary | ICD-10-CM

## 2019-04-06 DIAGNOSIS — M25552 Pain in left hip: Secondary | ICD-10-CM

## 2019-04-06 DIAGNOSIS — M5416 Radiculopathy, lumbar region: Secondary | ICD-10-CM | POA: Diagnosis not present

## 2019-04-06 DIAGNOSIS — M25561 Pain in right knee: Secondary | ICD-10-CM

## 2019-04-06 DIAGNOSIS — M5136 Other intervertebral disc degeneration, lumbar region: Secondary | ICD-10-CM | POA: Diagnosis not present

## 2019-04-06 DIAGNOSIS — M25551 Pain in right hip: Secondary | ICD-10-CM

## 2019-04-06 DIAGNOSIS — M79651 Pain in right thigh: Secondary | ICD-10-CM

## 2019-04-06 DIAGNOSIS — M48061 Spinal stenosis, lumbar region without neurogenic claudication: Secondary | ICD-10-CM | POA: Diagnosis not present

## 2019-04-06 MED ORDER — GABAPENTIN 300 MG PO CAPS: ORAL_CAPSULE | ORAL | 3 refills | Status: DC

## 2019-04-06 MED ORDER — PREDNISONE 10 MG PO TABS: 30.0000 mg | ORAL_TABLET | Freq: Every day | ORAL | 0 refills | Status: DC

## 2019-04-06 NOTE — Patient Instructions (Signed)
Thank you for coming in today. Get labs now.  Schedule physical therapy.  Take short course of prednisone. Try gabapentin mostly at bedtime for nerve pain.  Take mostly at night but can increase to three times daily if needed.   Recheck with me in 4 weeks.  Return sooner if needed.   If no benefit I will recommend trying to stop rosuvastatin for a few weeks and then restarting and seeing if your pain changes.    Radicular Pain Radicular pain is a type of pain that spreads from your back or neck along a spinal nerve. Spinal nerves are nerves that leave the spinal cord and go to the muscles. Radicular pain is sometimes called radiculopathy, radiculitis, or a pinched nerve. When you have this type of pain, you may also have weakness, numbness, or tingling in the area of your body that is supplied by the nerve. The pain may feel sharp and burning. Depending on which spinal nerve is affected, the pain may occur in the:  Neck area (cervical radicular pain). You may also feel pain, numbness, weakness, or tingling in the arms.  Mid-spine area (thoracic radicular pain). You would feel this pain in the back and chest. This type is rare.  Lower back area (lumbar radicular pain). You would feel this pain as low back pain. You may feel pain, numbness, weakness, or tingling in the buttocks or legs. Sciatica is a type of lumbar radicular pain that shoots down the back of the leg. Radicular pain occurs when one of the spinal nerves becomes irritated or squeezed (compressed). It is often caused by something pushing on a spinal nerve, such as one of the bones of the spine (vertebrae) or one of the round cushions between vertebrae (intervertebral disks). This can result from:  An injury.  Wear and tear or aging of a disk.  The growth of a bone spur that pushes on the nerve. Radicular pain often goes away when you follow instructions from your health care provider for relieving pain at home. Follow these  instructions at home: Managing pain      If directed, put ice on the affected area: ? Put ice in a plastic bag. ? Place a towel between your skin and the bag. ? Leave the ice on for 20 minutes, 2-3 times a day.  If directed, apply heat to the affected area as often as told by your health care provider. Use the heat source that your health care provider recommends, such as a moist heat pack or a heating pad. ? Place a towel between your skin and the heat source. ? Leave the heat on for 20-30 minutes. ? Remove the heat if your skin turns bright red. This is especially important if you are unable to feel pain, heat, or cold. You may have a greater risk of getting burned. Activity   Do not sit or rest in bed for long periods of time.  Try to stay as active as possible. Ask your health care provider what type of exercise or activity is best for you.  Avoid activities that make your pain worse, such as bending and lifting.  Do not lift anything that is heavier than 10 lb (4.5 kg), or the limit that you are told, until your health care provider says that it is safe.  Practice using proper technique when lifting items. Proper lifting technique involves bending your knees and rising up.  Do strength and range-of-motion exercises only as told by your health  care provider or physical therapist. General instructions  Take over-the-counter and prescription medicines only as told by your health care provider.  Pay attention to any changes in your symptoms.  Keep all follow-up visits as told by your health care provider. This is important. ? Your health care provider may send you to a physical therapist to help with this pain. Contact a health care provider if:  Your pain and other symptoms get worse.  Your pain medicine is not helping.  Your pain has not improved after a few weeks of home care.  You have a fever. Get help right away if:  You have severe pain, weakness, or numbness.   You have difficulty with bladder or bowel control. Summary  Radicular pain is a type of pain that spreads from your back or neck along a spinal nerve.  When you have radicular pain, you may also have weakness, numbness, or tingling in the area of your body that is supplied by the nerve.  The pain may feel sharp or burning.  Radicular pain may be treated with ice, heat, medicines, or physical therapy. This information is not intended to replace advice given to you by your health care provider. Make sure you discuss any questions you have with your health care provider. Document Released: 09/05/2004 Document Revised: 02/10/2018 Document Reviewed: 02/10/2018 Elsevier Patient Education  [place].    Hip Bursitis  Hip bursitis is inflammation of a fluid-filled sac (bursa) in the hip joint. The bursa prevents the bones in the hip joint from rubbing against each other. Hip bursitis can cause mild to moderate pain, and symptoms often come and go over time. What are the causes? This condition may be caused by:  Injury to the hip.  Overuse of the muscles that surround the hip joint.  Previous injury or surgery of the hip.  Arthritis or gout.  Diabetes.  Thyroid disease.  Infection. In some cases, the cause may not be known. What are the signs or symptoms? Symptoms of this condition include:  Mild or moderate pain in the hip area. Pain may get worse with movement.  Tenderness and swelling of the hip, especially on the outer side of the hip.  In rare cases, the bursa may become infected. This may cause a fever, as well as warmth and redness in the area. Symptoms may come and go. How is this diagnosed? This condition may be diagnosed based on:  A physical exam.  Your medical history.  X-rays.  Removal of fluid from your inflamed bursa for testing (biopsy). You may be sent to a health care provider who specializes in bone diseases (orthopedist) or a provider who  specializes in joint inflammation (rheumatologist). How is this treated? This condition is treated by resting, icing, applying pressure (compression), and raising (elevating) the injured area. This is called RICE treatment. In some cases, this may be enough to make your symptoms go away. Treatment may also include:  Using crutches.  Draining fluid out of the bursa to help relieve swelling.  Injecting medicine that helps to reduce inflammation (cortisone).  Additional medicines if the bursa is infected. Follow these instructions at home: Managing pain, stiffness, and swelling   If directed, put ice on the painful area. ? Put ice in a plastic bag. ? Place a towel between your skin and the bag. ? Leave the ice on for 20 minutes, 2-3 times a day. ? Raise (elevate) your hip above the level of your heart as much as  you can without pain. To do this, try putting a pillow under your hips while you lie down. Activity  Return to your normal activities as told by your health care provider. Ask your health care provider what activities are safe for you.  Rest and protect your hip as much as possible until your pain and swelling get better. General instructions  Take over-the-counter and prescription medicines only as told by your health care provider.  Wear compression wraps only as told by your health care provider.  Do not use your hip to support your body weight until your health care provider says that you can. Use crutches as told by your health care provider.  Gently massage and stretch your injured area as often as is comfortable.  Keep all follow-up visits as told by your health care provider. This is important. How is this prevented?  Exercise regularly, as told by your health care provider.  Warm up and stretch before being active.  Cool down and stretch after being active.  If an activity irritates your hip or causes pain, avoid the activity as much as possible.  Avoid  sitting down for long periods at a time. Contact a health care provider if you:  Have a fever.  Develop new symptoms.  Have difficulty walking or doing everyday activities.  Have pain that gets worse or does not get better with medicine.  Develop red skin or a feeling of warmth in your hip area. Get help right away if you:  Cannot move your hip.  Have severe pain. Summary  Hip bursitis is inflammation of a fluid-filled sac (bursa) in the hip joint.  Hip bursitis can cause mild to moderate pain, and symptoms often come and go over time.  This condition is treated with rest, ice, compression, elevation, and medicines. This information is not intended to replace advice given to you by your health care provider. Make sure you discuss any questions you have with your health care provider. Document Released: 01/18/2002 Document Revised: 04/06/2018 Document Reviewed: 04/06/2018 Elsevier Patient Education  2020 [employer].

## 2019-04-06 NOTE — Progress Notes (Signed)
Ann Griffith is a 68 y.o. female who presents to [facility] today for thigh and knee pain.    Patient notes a 6 to 58-month history of pain in the bilateral anterior to lateral thighs extending to the lateral posterior knees.  This occurs without injury.  She notes the pain typically will occur when she is sitting and driving for prolonged period of time.  Also the pain will wake her from sleep.  She denies any numbness or tingling or weakness.  She denies any bowel or bladder dysfunction.  She notes her knee pain is primarily posterior lateral knee pain.  The pain does not present with activity.  She denies any injury fevers chills nausea vomiting or diarrhea.  She is tried over-the-counter medicines for pain.  Additionally she is tried some diclofenac gel which has not helped much.     ROS:  As above  Exam:  BP 133/75   Pulse 73   Temp 97.9 F (36.6 C) (Oral)   Wt 175 lb (79.4 kg)   LMP 08/12/2000   BMI 27.82 kg/m  Wt Readings from Last 5 Encounters:  04/06/19 175 lb (79.4 kg)  01/27/19 171 lb (77.6 kg)  10/26/18 172 lb (78 kg)  07/27/18 173 lb (78.5 kg)  04/27/18 178 lb (80.7 kg)   General: Well Developed, well nourished, and in no acute distress.  Neuro/Psych: Alert and oriented x3, extra-ocular muscles intact, able to move all 4 extremities, sensation grossly intact. Skin: Warm and dry, no rashes noted.  Respiratory: Not using accessory muscles, speaking in full sentences, trachea midline.  Cardiovascular: Pulses palpable, no extremity edema. Abdomen: Does not appear distended. MSK:  L-spine: Nontender to spinal midline.  Normal lumbar motion negative slump test bilaterally.  Lower extremity strength reflexes and sensation are equal and normal throughout.  Hip: Bilateral hip exam normal motion and normal appearance. Mildly tender palpation greater trochanter bilaterally. Hip abduction strength 4/5 bilaterally  Knee exam  bilateral normal-appearing with no swelling or effusion or erythema. Not particularly tender.  Normal motion with crepitations.  Stable ligaments exam.  Intact strength.    Lab and Radiology Results X-ray images obtained today personally independently reviewed  L-spine: Spurring and mild DDD at L2-3 and L3-4 with facet arthropathy.  No fractures or malalignment.  Slightly worsened compared to L-spine x-ray dated 2013.  X-ray pelvis AP.  Mild degenerative changes from acetabular joint otherwise unremarkable appearance.  Left knee 4 view with right knee 2 view x-ray shows symmetrical.  Mild medial compartment DJD.  Some patellofemoral DJD present left knee along with quad tendon spur formation at patella. No fracture.  Await formal radiology over read    Assessment and Plan: 68 y.o. female with  Patient has 6 to 12 months of thigh pain extending to the posterior lateral knee.  It is a bit of a mystery of the exact cause of her pain.  Is likely multifactorial.  She has some evidence of hip abductor weakness and a little bit of pain at the greater trochanter bilaterally.  Certainly this could be a cause of pain.  Additionally she could have lumbar radicular likely L3 dermatome as a cause of pain however this should not really explain her posterior lateral knee pain.  Additionally she could have hamstring tendinitis for example causing some posterior lateral knee pain.  At this point discussed treatment options.  Plan for metabolic work-up to look for inflammatory conditions.  Additionally will refer to physical therapy  for 4-week to 6-week trial of physical therapy.  Lastly trial of gabapentin at bedtime and short course of prednisone.  Check back 4 weeks.   PDMP not reviewed this encounter. Orders Placed This Encounter  Procedures  . DG Knee Complete 4 Views Left    Please include patellar sunrise, lateral, and weightbearing bilateral AP and bilateral rosenberg views    Standing  Status:   Future    Number of Occurrences:   1    Standing Expiration Date:   06/05/2020    Order Specific Question:   Reason for exam:    Answer:   Please include patellar sunrise, lateral, and weightbearing bilateral AP and bilateral rosenberg views    Comments:   Please include patellar sunrise, lateral, and weightbearing bilateral AP and bilateral rosenberg views    Order Specific Question:   Preferred imaging location?    Answer:   [facility]  . DG Knee 1-2 Views Right    Standing Status:   Future    Number of Occurrences:   1    Standing Expiration Date:   06/06/2020    Order Specific Question:   Reason for Exam (SYMPTOM  OR DIAGNOSIS REQUIRED)    Answer:   For use with the left knee x-ray bilateral AP and Rosenberg standing.    Order Specific Question:   Preferred imaging location?    Answer:   [facility]  . DG Lumbar Spine Complete    Please include AP, Lateral, obliques, and lumbosacral spot views.    Standing Status:   Future    Number of Occurrences:   1    Standing Expiration Date:   06/05/2020    Order Specific Question:   Preferred imaging location?    Answer:   [facility]    Order Specific Question:   Reason for exam:    Answer:   Please include AP, Lateral, obliques, and lumbosacral spot views.    Comments:   Please include AP, Lateral, obliques, and lumbosacral spot views.  . DG Pelvis 1-2 Views    Standing Status:   Future    Number of Occurrences:   1    Standing Expiration Date:   06/05/2020    Order Specific Question:   Reason for Exam (SYMPTOM  OR DIAGNOSIS REQUIRED)    Answer:   eval hip pain BL    Order Specific Question:   Preferred imaging location?    Answer:   [facility]    Order Specific Question:   Radiology Contrast Protocol - do NOT remove file path    Answer:   [email or web address]  . CK  . Sedimentation rate  . TSH  . CBC  . Ambulatory referral to Physical  Therapy    Referral Priority:   Routine    Referral Type:   Physical Medicine    Referral Reason:   Specialty Services Required    Requested Specialty:   Physical Therapy   Meds ordered this encounter  Medications  . predniSONE (DELTASONE) 10 MG tablet    Sig: Take 3 tablets (30 mg total) by mouth daily with breakfast.    Dispense:  15 tablet    Refill:  0  . gabapentin (NEURONTIN) 300 MG capsule    Sig: One tab PO qHS for a week, then BID for a week, then TID. May double weekly to a max of 3,600mg /day    Dispense:  180 capsule    Refill:  3    Historical information moved  to improve visibility of documentation.  Past Medical History:  Diagnosis Date  . CHOLELITHIASIS, HX OF 11/09/2007   Qualifier: Diagnosis of  By: [name] Mcalpine    . DIABETES MELLITUS, TYPE II 11/09/2007   Qualifier: Diagnosis of  By: [name] Mcalpine    . Diverticulosis   . Hyperlipidemia    Past Surgical History:  Procedure Laterality Date  . bone spur removed  2003   left heel  . CATARACT EXTRACTION Left 02/2016  . CHOLECYSTECTOMY    . COLONOSCOPY  08/2003   Social History   Tobacco Use  . Smoking status: Never Smoker  . Smokeless tobacco: Never Used  Substance Use Topics  . Alcohol use: Yes    Alcohol/week: 1.0 standard drinks    Types: 1 Standard drinks or equivalent per week   family history includes Diabetes in her mother.  Medications: Current Outpatient Medications  Medication Sig Dispense Refill  . AMBULATORY NON FORMULARY MEDICATION Test strips and lancets.  Testing once a day.   Dx: 250.00 100 strip 1  . amLODipine (NORVASC) 2.5 MG tablet Take 1-2 tablets (2.5-5 mg total) by mouth daily. 90 tablet 1  . cholecalciferol (VITAMIN D) 1000 UNITS tablet Take 1,000 Units by mouth 3 (three) times daily.     . Cyanocobalamin (VITAMIN B-12 PO) Take 5,000 mcg by mouth.    . diclofenac (VOLTAREN) 75 MG EC tablet TAKE 1 TABLET BY MOUTH TWICE A DAY 120 tablet 1  . diclofenac sodium  (VOLTAREN) 1 % GEL APPLY 4 GRAMS 4 TIMES DAILY TOPICALLY. TO AFFECTED JOINT. 100 g 9  . fish oil-omega-3 fatty acids 1000 MG capsule Take 1 g by mouth daily.    [name] gabapentin (NEURONTIN) 300 MG capsule One tab PO qHS for a week, then BID for a week, then TID. May double weekly to a max of 3,600mg /day 180 capsule 3  . lisinopril (PRINIVIL,ZESTRIL) 2.5 MG tablet Take 1 tablet (2.5 mg total) by mouth daily. 90 tablet 3  . meclizine (ANTIVERT) 25 MG tablet Take 1 tablet (25 mg total) by mouth 3 (three) times daily as needed for dizziness. 30 tablet 0  . metFORMIN (GLUCOPHAGE) 500 MG tablet TAKE 2 TABLETS BY MOUTH EVERY DAY WITH BREAKFAST 180 tablet 3  . Misc. Devices MISC One touch Ultra test strips 50/package  To test once a day for DM type II controlled. 50 each 1  . predniSONE (DELTASONE) 10 MG tablet Take 3 tablets (30 mg total) by mouth daily with breakfast. 15 tablet 0  . rOPINIRole (REQUIP) 0.25 MG tablet TAKE 1-4 TABLETS BY MOUTH AT BEDTIME. 270 tablet 0  . rosuvastatin (CRESTOR) 20 MG tablet Take 1 tablet (20 mg total) by mouth daily. 90 tablet 3  . traZODone (DESYREL) 50 MG tablet TAKE 1 TABLET BY MOUTH EVERYDAY AT BEDTIME 90 tablet 3  . triamcinolone cream (KENALOG) 0.1 % Apply 1 application topically 2 (two) times daily. 60 g 0   No current facility-administered medications for this visit.    Allergies  Allergen Reactions  . Amoxicillin Swelling  . Atorvastatin Other (See Comments)    Memory loss      Discussed warning signs or symptoms. Please see discharge instructions. Patient expresses understanding.

## 2019-04-07 LAB — CK: Total CK: 87 U/L (ref 29–143)

## 2019-04-07 LAB — SEDIMENTATION RATE: Sed Rate: 6 mm/h (ref 0–30)

## 2019-04-07 LAB — CBC
HCT: 34.1 % — ABNORMAL LOW (ref 35.0–45.0)
Hemoglobin: 11.4 g/dL — ABNORMAL LOW (ref 11.7–15.5)
MCH: 32.2 pg (ref 27.0–33.0)
MCHC: 33.4 g/dL (ref 32.0–36.0)
MCV: 96.3 fL (ref 80.0–100.0)
MPV: 11 fL (ref 7.5–12.5)
Platelets: 223 10*3/uL (ref 140–400)
RBC: 3.54 10*6/uL — ABNORMAL LOW (ref 3.80–5.10)
RDW: 12.5 % (ref 11.0–15.0)
WBC: 5.8 10*3/uL (ref 3.8–10.8)

## 2019-04-07 LAB — TSH: TSH: 2.37 mIU/L (ref 0.40–4.50)

## 2019-04-12 ENCOUNTER — Ambulatory Visit: Payer: Medicare HMO | Admitting: Physical Therapy

## 2019-04-12 ENCOUNTER — Encounter: Payer: Self-pay | Admitting: Physical Therapy

## 2019-04-12 ENCOUNTER — Other Ambulatory Visit: Payer: Self-pay

## 2019-04-12 DIAGNOSIS — M79652 Pain in left thigh: Secondary | ICD-10-CM

## 2019-04-12 DIAGNOSIS — M6281 Muscle weakness (generalized): Secondary | ICD-10-CM | POA: Diagnosis not present

## 2019-04-12 DIAGNOSIS — M79651 Pain in right thigh: Secondary | ICD-10-CM

## 2019-04-12 NOTE — Patient Instructions (Signed)
Access Code: [email or web address]  URL: [email or web address]  Date: 04/12/2019  Prepared by: Almyra Free Dalisa Forrer   Exercises Supine Hamstring Stretch with Strap - 3 reps - 1 sets - 60 sec hold - 2x daily - 7x weekly Supine ITB Stretch with Strap - 3 reps - 1 sets - 60 sec hold - 2x daily - 7x weekly Sidelying Quadriceps Stretch - 3 reps - 1 sets - 60 seconds hold - 2x daily - 7x weekly Seated Piriformis Stretch - 3 reps - 1 sets - 30-60 sec hold - 2x daily - 7x weekly

## 2019-04-12 NOTE — Therapy (Signed)
[facility] [address] [place], [place], [address] Phone: [phone]   Fax:  [phone]  Physical Therapy Evaluation  Patient Details  Name: Ann Griffith MRN: [id number] Date of Birth: [birth year] Referring Provider (PT): Lynne Leader   Encounter Date: 04/12/2019  PT End of Session - 04/12/19 0852    Visit Number  1    Number of Visits  8    Date for PT Re-Evaluation  05/10/19    Authorization Type  [employer]/MCR    PT Start Time  0852    PT Stop Time  0941    PT Time Calculation (min)  49 min    Activity Tolerance  Patient tolerated treatment well    Behavior During Therapy  [facility] for tasks assessed/performed       Past Medical History:  Diagnosis Date  . CHOLELITHIASIS, HX OF 11/09/2007   Qualifier: Diagnosis of  By: [name] Mcalpine    . DIABETES MELLITUS, TYPE II 11/09/2007   Qualifier: Diagnosis of  By: [name] Mcalpine    . Diverticulosis   . Hyperlipidemia     Past Surgical History:  Procedure Laterality Date  . bone spur removed  2003   left heel  . CATARACT EXTRACTION Left 02/2016  . CHOLECYSTECTOMY    . COLONOSCOPY  08/2003    There were no vitals filed for this visit.   Subjective Assessment - 04/12/19 0857    Subjective  Patient began experiencing bil thigh pain about 6 months to a year ago. She cannot sit for very long and she awakens with pain when she's sleeping.    Pertinent History  arthritis, DM    Limitations  Sitting    How long can you sit comfortably?  2 hours    Diagnostic tests  xrays of knees, pelvis, lumbar negative    Patient Stated Goals  get rid of pain    Currently in Pain?  No/denies   can get up to 10/10 with sitting        [facility] PT Assessment - 04/12/19 0001      Assessment   Medical Diagnosis  chronic bil knee pain, bil thigh pain    Referring Provider (PT)  Lynne Leader    Onset Date/Surgical Date  12/24/17    Hand Dominance  Right    Next MD Visit  05/07/19     Prior Therapy  no      Precautions   Precautions  None      Balance Screen   Has the patient fallen in the past 6 months  No    Has the patient had a decrease in activity level because of a fear of falling?   No    Is the patient reluctant to leave their home because of a fear of falling?   No      Home Environment   Living Environment  Private residence    Living Arrangements  Spouse/significant other    Type of [place] to live on main level with bedroom/bathroom    Additional Comments  some pain with ascending stairs      Prior Function   Level of Independence  Independent    Vocation  Retired    Leisure  working in the yard      Observation/Other Assessments   Focus on Therapeutic Outcomes (FOTO)   16% limited      Functional Tests   Functional tests  Squat;Step up;Step down;Single leg stance      Squat   Comments  WFL      Step Up   Comments  WFL      Step Down   Comments  poor eccentric quad control bil, hip IR bil Rt weaker than left      Single Leg Stance   Comments  6-7 sec bil      Posture/Postural Control   Posture/Postural Control  No significant limitations      ROM / Strength   AROM / PROM / Strength  AROM;Strength      AROM   Overall AROM Comments  Lumbar and BLE WFL      Strength   Overall Strength Comments  left hip flex 4/5, right 4+/5, bil hip ext/ABD  4+/5      Flexibility   Soft Tissue Assessment /Muscle Length  yes    Hamstrings  mild bil tightness    Quadriceps  mild bil tightness    ITB  left tight; bil tenderness    Piriformis  WNL      Palpation   Palpation comment  tender along bil ITB/lateral quads      Special Tests   Other special tests  + ober on right      Ambulation/Gait   Gait Comments  mild gait deviations including drifting out of straight path; able to stay in straight line when cued.      Standardized Balance Assessment   Standardized Balance Assessment  Timed Up and Go Test;Dynamic  Gait Index      Dynamic Gait Index   Level Surface  Normal    Change in Gait Speed  Normal    Gait with Horizontal Head Turns  Normal    Gait with Vertical Head Turns  Normal    Gait and Pivot Turn  Normal    Step Over Obstacle  Normal    Step Around Obstacles  Mild Impairment   had to slow briefly   Steps  Mild Impairment    Total Score  22      Timed Up and Go Test   Normal TUG (seconds)  8                Objective measurements completed on examination: See above findings.              PT Education - 04/12/19 0939    Education Details  HEP    Person(s) Educated  Patient    Methods  Explanation;Demonstration;Handout    Comprehension  Returned demonstration;Verbalized understanding          PT Long Term Goals - 04/12/19 0950      PT LONG TERM GOAL #1   Title  Ind with HEP for strength, flexibility and balance    Time  4    Period  Weeks    Status  New    Target Date  05/10/19      PT LONG TERM GOAL #2   Title  Patient able to sit without pain.    Time  4    Period  Weeks    Status  New      PT LONG TERM GOAL #3   Title  Patient able to sleep without waking from pain.    Time  4    Period  Weeks    Status  New      PT LONG TERM GOAL #4   Title  Patient able to descend stairs  with good quad control.    Time  4    Period  Weeks    Status  New      PT LONG TERM GOAL #5   Title  Improved SLS to 10 sec bil    Time  4    Period  Weeks    Status  New             Plan - 04/12/19 [id number]    Clinical Impression Statement  Patient presents with c/o bil thigh pain for over one year. She has pain mainly with prolonged sitting and it also wakes her from sleep. She has intermittent knee pain as well. She has full ROM in BLE. She has mild flexibilty deficits as well as functional strength deficits with descending stairs. She also demonstrates mild balance deficits with amb and reports feeling like she walks on the outside of her feet at times.  She will benefit from PT to address these deficits.    Personal Factors and Comorbidities  Age;Comorbidity 2    Comorbidities  arthritis, DM    Examination-Activity Limitations  Sit;Stairs;Other   sleep   Stability/Clinical Decision Making  Stable/Uncomplicated    Clinical Decision Making  Low    Rehab Potential  Excellent    PT Frequency  2x / week    PT Duration  4 weeks    PT Treatment/Interventions  ADLs/Self Care Home Management;Cryotherapy;Electrical Stimulation;Moist Heat;Iontophoresis 4mg /ml Dexamethasone;Ultrasound;Stair training;Therapeutic activities;Therapeutic exercise;Balance training;Neuromuscular re-education;Patient/family education;Manual techniques;Dry needling    PT Next Visit Plan  work eccentric quads for stairs, higher level balance; STW to bil ITB    PT Home Exercise Plan  [id number]       Patient will benefit from skilled therapeutic intervention in order to improve the following deficits and impairments:  Pain, Decreased strength, Decreased balance, Impaired flexibility  Visit Diagnosis: Pain in left thigh - Plan: PT plan of care cert/re-cert  Pain in right thigh - Plan: PT plan of care cert/re-cert  Muscle weakness (generalized) - Plan: PT plan of care cert/re-cert     Problem List Patient Active Problem List   Diagnosis Date Noted  . Statin intolerance 07/03/2018  . Itching 04/27/2018  . Chronic pain of both ankles 04/27/2018  . Bilateral carpal tunnel syndrome 04/27/2018  . Raynaud's phenomenon without gangrene 02/03/2018  . Mitral regurgitation 12/19/2017  . Tricuspid regurgitation 12/19/2017  . Orthostatic hypotension 12/16/2017  . Syncope and collapse 12/16/2017  . Essential hypertension 12/10/2017  . Concussion 12/08/2017  . Flatulence 09/28/2017  . Fear of flying 09/28/2017  . Trochanteric bursitis 06/27/2017  . Trigger finger, left ring finger 06/26/2017  . Chronic pain of both knees 06/26/2017  . Carpal tunnel syndrome, right 06/26/2017   . Dry skin dermatitis 06/26/2017  . 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 Elevated serum creatinine 12/10/2016  . Cataract cortical, senile, left 06/04/2016  . 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 Tennis elbow syndrome 03/29/2016  . RLS (restless legs syndrome) 03/29/2016  . Right shoulder pain 03/29/2016  . Insomnia 08/08/2015  . Right leg pain 08/08/2015  . Shoulder bursitis 08/08/2015  . Memory changes 05/05/2015  . Seborrheic dermatitis 03/14/2015  . BPPV (benign paroxysmal positional vertigo), right 11/02/2014  . Microalbuminuria 11/02/2014  . Raynaud's syndrome 08/09/2013  . Diverticulosis 06/07/2013  . Avitaminosis D 10/09/2012  . Colon polyp 04/24/2011  . Combined hyperlipidemia associated with type 2 diabetes mellitus ([place]) 04/24/2011  . Diabetes mellitus, type II ([place]) 11/09/2007  .  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 CHOLELITHIASIS, HX OF 11/09/2007    Ann Griffith PT 04/12/2019, 10:02 AM  [facility] [address], [place], [address] Phone: [phone]   Fax:  [phone]  Name: Ann Griffith MRN: [id number] Date of Birth: [birth year]

## 2019-04-15 ENCOUNTER — Other Ambulatory Visit: Payer: Self-pay | Admitting: Neurology

## 2019-04-15 DIAGNOSIS — G2581 Restless legs syndrome: Secondary | ICD-10-CM

## 2019-04-16 ENCOUNTER — Other Ambulatory Visit: Payer: Self-pay

## 2019-04-16 ENCOUNTER — Encounter: Payer: Self-pay | Admitting: Physical Therapy

## 2019-04-16 ENCOUNTER — Ambulatory Visit (INDEPENDENT_AMBULATORY_CARE_PROVIDER_SITE_OTHER): Payer: Medicare HMO | Admitting: Physical Therapy

## 2019-04-16 DIAGNOSIS — M79651 Pain in right thigh: Secondary | ICD-10-CM | POA: Diagnosis not present

## 2019-04-16 DIAGNOSIS — M6281 Muscle weakness (generalized): Secondary | ICD-10-CM

## 2019-04-16 DIAGNOSIS — M79652 Pain in left thigh: Secondary | ICD-10-CM

## 2019-04-16 NOTE — Therapy (Signed)
[place], [place], [address] Phone: [phone]   Fax:  [phone]  Physical Therapy Treatment  Patient Details  Name: Ann Griffith MRN: [id number] Date of Birth: [birth year] Referring Provider (PT): Lynne Leader   Encounter Date: 04/16/2019  PT End of Session - 04/16/19 0940    Visit Number  2    Number of Visits  8    Date for PT Re-Evaluation  05/10/19    Authorization Type  Aetna/MCR    PT Start Time  0934    PT Stop Time  1014    PT Time Calculation (min)  40 min    Activity Tolerance  Patient tolerated treatment well;No increased pain    Behavior During Therapy  WFL for tasks assessed/performed       Past Medical History:  Diagnosis Date  . CHOLELITHIASIS, HX OF 11/09/2007   Qualifier: Diagnosis of  By: [name] Mcalpine    . DIABETES MELLITUS, TYPE II 11/09/2007   Qualifier: Diagnosis of  By: [name] Mcalpine    . Diverticulosis   . Hyperlipidemia     Past Surgical History:  Procedure Laterality Date  . bone spur removed  2003   left heel  . CATARACT EXTRACTION Left 02/2016  . CHOLECYSTECTOMY    . COLONOSCOPY  08/2003    There were no vitals filed for this visit.  Subjective Assessment - 04/16/19 0936    Subjective  Pt reports prior to covid she was going to gym, walking 4 miles  3-4 days a week, and hasn't been walking much now.    She feels her HEP has helped.  Her sleep has improved with medication.     Diagnostic tests  xrays of knees, pelvis, lumbar negative    Patient Stated Goals  get rid of pain    Currently in Pain?  No/denies         [facility] PT Assessment - 04/16/19 0001      Assessment   Medical Diagnosis  chronic bil knee pain, bil thigh pain    Referring Provider (PT)  Lynne Leader    Onset Date/Surgical Date  12/24/17    Hand Dominance  Right    Next MD Visit  05/07/19    Prior Therapy  no       [facility] Adult PT Treatment/Exercise - 04/16/19 0001      Self-Care   Self-Care  Other Self-Care Comments    Other Self-Care Comments   Pt educated on self massage with roller stick to LE to decrease tightness; pt returned demo with cues.       Exercises   Exercises  Knee/Hip      Knee/Hip Exercises: Stretches   Passive Hamstring Stretch  Right;Left;2 reps;30 seconds   seated    Quad Stretch  Right;Left;2 reps;20 seconds   seated, foot under chair.    Hip Flexor Stretch  Right;Left;2 reps;20 seconds   seated   Piriformis Stretch  Right;Left;2 reps   seated      Knee/Hip Exercises: Aerobic   Recumbent Bike  L2 5 min       Manual Therapy   Manual Therapy  Soft tissue mobilization;Myofascial release    Manual therapy comments  pt in hooklying, supported.     Soft tissue mobilization  IASTM and STM to bilat quads     Myofascial Release  MFR to lateral superior quad  PT Education - 04/16/19 1143    Education Details  HEP:  added hip flexor, quad, hamstring stretch.  Self care of roller massage.    Person(s) Educated  Patient    Methods  Explanation;Handout;Demonstration;Verbal cues    Comprehension  Verbalized understanding;Returned demonstration          PT Long Term Goals - 04/12/19 0950      PT LONG TERM GOAL #1   Title  Ind with HEP for strength, flexibility and balance    Time  4    Period  Weeks    Status  New    Target Date  05/10/19      PT LONG TERM GOAL #2   Title  Patient able to sit without pain.    Time  4    Period  Weeks    Status  New      PT LONG TERM GOAL #3   Title  Patient able to sleep without waking from pain.    Time  4    Period  Weeks    Status  New      PT LONG TERM GOAL #4   Title  Patient able to descend stairs with good quad control.    Time  4    Period  Weeks    Status  New      PT LONG TERM GOAL #5   Title  Improved SLS to 10 sec bil    Time  4    Period  Weeks    Status  New            Plan - 04/16/19 1132    Clinical Impression Statement  Pt shown  alternatives to stretches to assist in compliance of HEP.  Palpable fascial tightness in lateral superior quads bilat (Rt tighter than Lt); reduced with manual therapy.  She tolerated all exercises well.  goals are ongoing.    Personal Factors and Comorbidities  Age;Comorbidity 2    Comorbidities  arthritis, DM    Examination-Activity Limitations  Sit;Stairs;Other   sleep   Stability/Clinical Decision Making  Stable/Uncomplicated    Rehab Potential  Excellent    PT Frequency  2x / week    PT Duration  4 weeks    PT Treatment/Interventions  ADLs/Self Care Home Management;Cryotherapy;Electrical Stimulation;Moist Heat;Iontophoresis 4mg /ml Dexamethasone;Ultrasound;Stair training;Therapeutic activities;Therapeutic exercise;Balance training;Neuromuscular re-education;Patient/family education;Manual techniques;Dry needling    PT Next Visit Plan  work eccentric quads for stairs, higher level balance; STW to bil ITB    PT Home Exercise Plan  [id number]       Patient will benefit from skilled therapeutic intervention in order to improve the following deficits and impairments:  Pain, Decreased strength, Decreased balance, Impaired flexibility  Visit Diagnosis: Pain in left thigh  Pain in right thigh  Muscle weakness (generalized)     Problem List Patient Active Problem List   Diagnosis Date Noted  . Statin intolerance 07/03/2018  . Itching 04/27/2018  . Chronic pain of both ankles 04/27/2018  . Bilateral carpal tunnel syndrome 04/27/2018  . Raynaud's phenomenon without gangrene 02/03/2018  . Mitral regurgitation 12/19/2017  . Tricuspid regurgitation 12/19/2017  . Orthostatic hypotension 12/16/2017  . Syncope and collapse 12/16/2017  . Essential hypertension 12/10/2017  . Concussion 12/08/2017  . Flatulence 09/28/2017  . Fear of flying 09/28/2017  . Trochanteric bursitis 06/27/2017  . Trigger finger, left ring finger 06/26/2017  . Chronic pain of both knees 06/26/2017  . Carpal tunnel  syndrome, right 06/26/2017  .  Dry skin dermatitis 06/26/2017  . 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 Elevated serum creatinine 12/10/2016  . Cataract cortical, senile, left 06/04/2016  . 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 Tennis elbow syndrome 03/29/2016  . RLS (restless legs syndrome) 03/29/2016  . Right shoulder pain 03/29/2016  . Insomnia 08/08/2015  . Right leg pain 08/08/2015  . Shoulder bursitis 08/08/2015  . Memory changes 05/05/2015  . Seborrheic dermatitis 03/14/2015  . BPPV (benign paroxysmal positional vertigo), right 11/02/2014  . Microalbuminuria 11/02/2014  . Raynaud's syndrome 08/09/2013  . Diverticulosis 06/07/2013  . Avitaminosis D 10/09/2012  . Colon polyp 04/24/2011  . Combined hyperlipidemia associated with type 2 diabetes mellitus ([place]) 04/24/2011  . Diabetes mellitus, type II ([place]) 11/09/2007  . 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 CHOLELITHIASIS, HX OF 11/09/2007   Kerin Perna, PTA 04/16/19 11:44 AM  [place], [place], [address] Phone: [phone]   Fax:  [phone]  Name: Ann Griffith MRN: [id number] Date of Birth: 09-Feb-1951

## 2019-04-20 ENCOUNTER — Other Ambulatory Visit: Payer: Self-pay

## 2019-04-20 ENCOUNTER — Ambulatory Visit: Payer: Medicare HMO | Admitting: Physical Therapy

## 2019-04-20 DIAGNOSIS — M6281 Muscle weakness (generalized): Secondary | ICD-10-CM

## 2019-04-20 DIAGNOSIS — M79651 Pain in right thigh: Secondary | ICD-10-CM | POA: Diagnosis not present

## 2019-04-20 DIAGNOSIS — M79652 Pain in left thigh: Secondary | ICD-10-CM

## 2019-04-20 NOTE — Therapy (Signed)
[place], [place], [address] Phone: [phone]   Fax:  [phone]  Physical Therapy Treatment  Patient Details  Name: Ann Griffith MRN: [id number] Date of Birth: 20-Mar-1951 Referring Provider (PT): Lynne Leader   Encounter Date: 04/20/2019  PT End of Session - 04/20/19 0928    Visit Number  3    Number of Visits  8    Date for PT Re-Evaluation  05/10/19    Authorization Type  Aetna/MCR    PT Start Time  0846    PT Stop Time  0928    PT Time Calculation (min)  42 min    Activity Tolerance  Patient tolerated treatment well    Behavior During Therapy  [facility] for tasks assessed/performed       Past Medical History:  Diagnosis Date  . CHOLELITHIASIS, HX OF 11/09/2007   Qualifier: Diagnosis of  By: [name] Mcalpine    . DIABETES MELLITUS, TYPE II 11/09/2007   Qualifier: Diagnosis of  By: [name] Mcalpine    . Diverticulosis   . Hyperlipidemia     Past Surgical History:  Procedure Laterality Date  . bone spur removed  2003   left heel  . CATARACT EXTRACTION Left 02/2016  . CHOLECYSTECTOMY    . COLONOSCOPY  08/2003    There were no vitals filed for this visit.  Subjective Assessment - 04/20/19 1033    Subjective  Pt reports her buttocks is a little sore from walking in the park yesterday.  She rolled her thighs out 2x since last visit and "it feels good".  Rt thigh has been sore at night.    Currently in Pain?  No/denies         [facility] PT Assessment - 04/20/19 0001      Assessment   Medical Diagnosis  chronic bil knee pain, bil thigh pain    Referring Provider (PT)  Lynne Leader    Onset Date/Surgical Date  12/24/17    Hand Dominance  Right    Next MD Visit  05/07/19    Prior Therapy  no       [facility] Adult PT Treatment/Exercise - 04/20/19 0001      Knee/Hip Exercises: Stretches   Passive Hamstring Stretch  Right;Left;2 reps;30 seconds   seated    Quad Stretch  Right;Left;2  reps;20 seconds   seated, foot under chair.      Knee/Hip Exercises: Aerobic   Recumbent Bike  --   pt declined, had warmed up prior to session     Knee/Hip Exercises: Standing   Forward Step Up  Right;Left;1 set;10 reps    Forward Step Up Limitations  trial of stepping up to 12" step    Step Down  Right;Left;1 set;5 reps    SLS  Rt/Lt SLS flat ground x 20 sec each, then 30 sec with horiz head turns.     Other Standing Knee Exercises  tandem stance with horiz head turns x 20 sec; repeated standing on blue pads x 20sec each leg forward.       Knee/Hip Exercises: Seated   Other Seated Knee/Hip Exercises  seated bilat eversion x 20 reps; R/L ankle circles (challenging)    Sit to Sand  1 set;10 reps;without UE support   eccentric lowering, low black mat     Manual Therapy   Manual therapy comments  pt in hooklying, supported with black bolster    Soft tissue mobilization  STM to bilat  lower legs to decrease soreness after SLS    Myofascial Release  MFR to Rt lateral quad                   PT Long Term Goals - 04/20/19 1039      PT LONG TERM GOAL #1   Title  Ind with HEP for strength, flexibility and balance    Time  4    Period  Weeks    Status  On-going      PT LONG TERM GOAL #2   Title  Patient able to sit without pain.    Time  4    Period  Weeks    Status  On-going      PT LONG TERM GOAL #3   Title  Patient able to sleep without waking from pain.    Time  4    Period  Weeks    Status  On-going      PT LONG TERM GOAL #4   Title  Patient able to descend stairs with good quad control.    Time  4    Period  Weeks    Status  Achieved      PT LONG TERM GOAL #5   Title  Improved SLS to 10 sec bil    Time  4    Period  Weeks    Status  Achieved            Plan - 04/20/19 0934    Clinical Impression Statement  Pt tolerated new balance and quad strengthening exercises well, with minor discomfort in Rt knee.  Pt reported some discomfort in bilat  ankles with balance exercises; reduced with rest and STM.  Pt has met LTG #4 and 5.    Rehab Potential  Excellent    PT Frequency  2x / week    PT Duration  4 weeks    PT Treatment/Interventions  ADLs/Self Care Home Management;Cryotherapy;Electrical Stimulation;Moist Heat;Iontophoresis 40m/ml Dexamethasone;Ultrasound;Stair training;Therapeutic activities;Therapeutic exercise;Balance training;Neuromuscular re-education;Patient/family education;Manual techniques;Dry needling    PT Next Visit Plan  assess response to new HEP.    PT Home Exercise Plan  [id number]   Consulted and Agree with Plan of Care  Patient       Patient will benefit from skilled therapeutic intervention in order to improve the following deficits and impairments:     Visit Diagnosis: Pain in left thigh  Pain in right thigh  Muscle weakness (generalized)     Problem List Patient Active Problem List   Diagnosis Date Noted  . Statin intolerance 07/03/2018  . Itching 04/27/2018  . Chronic pain of both ankles 04/27/2018  . Bilateral carpal tunnel syndrome 04/27/2018  . Raynaud's phenomenon without gangrene 02/03/2018  . Mitral regurgitation 12/19/2017  . Tricuspid regurgitation 12/19/2017  . Orthostatic hypotension 12/16/2017  . Syncope and collapse 12/16/2017  . Essential hypertension 12/10/2017  . Concussion 12/08/2017  . Flatulence 09/28/2017  . Fear of flying 09/28/2017  . Trochanteric bursitis 06/27/2017  . Trigger finger, left ring finger 06/26/2017  . Chronic pain of both knees 06/26/2017  . Carpal tunnel syndrome, right 06/26/2017  . Dry skin dermatitis 06/26/2017  . 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 Elevated serum creatinine 12/10/2016  . Cataract cortical, senile, left 06/04/2016  . 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 Tennis elbow syndrome 03/29/2016  . RLS (restless legs syndrome) 03/29/2016  . Right shoulder pain 03/29/2016  . Insomnia  08/08/2015  . Right  leg pain 08/08/2015  . Shoulder bursitis 08/08/2015  . Memory changes 05/05/2015  . Seborrheic dermatitis 03/14/2015  . BPPV (benign paroxysmal positional vertigo), right 11/02/2014  . Microalbuminuria 11/02/2014  . Raynaud's syndrome 08/09/2013  . Diverticulosis 06/07/2013  . Avitaminosis D 10/09/2012  . Colon polyp 04/24/2011  . Combined hyperlipidemia associated with type 2 diabetes mellitus ([place]) 04/24/2011  . Diabetes mellitus, type II ([place]) 11/09/2007  . 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 CHOLELITHIASIS, HX OF 11/09/2007   Kerin Perna, PTA 04/20/19 10:40 AM  [facility] [place], [place], [address] Phone: [phone]   Fax:  [phone]  Name: Ann Griffith MRN: [id number] Date of Birth: 1951/03/12

## 2019-04-22 ENCOUNTER — Other Ambulatory Visit: Payer: Self-pay

## 2019-04-22 ENCOUNTER — Encounter: Payer: Self-pay | Admitting: Physical Therapy

## 2019-04-22 ENCOUNTER — Ambulatory Visit: Payer: Medicare HMO | Admitting: Physical Therapy

## 2019-04-22 DIAGNOSIS — M79651 Pain in right thigh: Secondary | ICD-10-CM | POA: Diagnosis not present

## 2019-04-22 DIAGNOSIS — M79652 Pain in left thigh: Secondary | ICD-10-CM

## 2019-04-22 DIAGNOSIS — M6281 Muscle weakness (generalized): Secondary | ICD-10-CM | POA: Diagnosis not present

## 2019-04-22 NOTE — Therapy (Signed)
[place], [place], [address] Phone: [phone]   Fax:  [phone]  Physical Therapy Treatment  Patient Details  Name: Ann Griffith MRN: [id number] Date of Birth: 05-25-1951 Referring Provider (PT): Lynne Leader   Encounter Date: 04/22/2019  PT End of Session - 04/22/19 0851    Visit Number  4    Number of Visits  8    Date for PT Re-Evaluation  05/10/19    Authorization Type  Aetna/MCR    PT Start Time  0849    PT Stop Time  0927    PT Time Calculation (min)  38 min    Activity Tolerance  Patient tolerated treatment well;No increased pain    Behavior During Therapy  WFL for tasks assessed/performed       Past Medical History:  Diagnosis Date  . CHOLELITHIASIS, HX OF 11/09/2007   Qualifier: Diagnosis of  By: [name] Mcalpine    . DIABETES MELLITUS, TYPE II 11/09/2007   Qualifier: Diagnosis of  By: [name] Mcalpine    . Diverticulosis   . Hyperlipidemia     Past Surgical History:  Procedure Laterality Date  . bone spur removed  2003   left heel  . CATARACT EXTRACTION Left 02/2016  . CHOLECYSTECTOMY    . COLONOSCOPY  08/2003    There were no vitals filed for this visit.  Subjective Assessment - 04/22/19 0851    Subjective  Pt reports she was very sore after last session, "I could barely walk yesterday".  Pt declined to perform sit to stand today.    How long can you sit comfortably?  2 hours    Patient Stated Goals  get rid of pain    Currently in Pain?  Yes    Pain Score  4     Pain Location  Leg    Pain Orientation  Left;Right    Pain Descriptors / Indicators  Sore    Aggravating Factors   sit to stand, stairs    Pain Relieving Factors  massage, ice, medicine         [facility] PT Assessment - 04/22/19 0001      Assessment   Medical Diagnosis  chronic bil knee pain, bil thigh pain    Referring Provider (PT)  Lynne Leader    Onset Date/Surgical Date  12/24/17    Hand  Dominance  Right    Next MD Visit  05/07/19    Prior Therapy  no       [facility] Adult PT Treatment/Exercise - 04/22/19 0001      Knee/Hip Exercises: Stretches   Passive Hamstring Stretch  Right;Left;2 reps;30 seconds   seated    Quad Stretch  Right;Left;2 reps;20 seconds   seated, foot under chair.    Gastroc Stretch  Both;2 reps;30 seconds   incline board     Knee/Hip Exercises: Aerobic   Recumbent Bike  L1: 6 min       Knee/Hip Exercises: Standing   SLS  Rt/Lt SLS flat ground x 25-30 sec each, then 30 sec with horiz head turns.     Other Standing Knee Exercises  tandem stance with horiz head turns x 20 sec; repeated standing on blue pads x 20sec each leg forward.     Other Standing Knee Exercises  standing, ankle circles x 10 each foot, each direction      Manual Therapy   Manual therapy comments  pt in hooklying, supported with black bolster  Soft tissue mobilization  STM to bilat quads to decrease fascial tightness.     Myofascial Release  MFR to Rt/Lt lateral quad        PT Long Term Goals - 04/20/19 1039      PT LONG TERM GOAL #1   Title  Ind with HEP for strength, flexibility and balance    Time  4    Period  Weeks    Status  On-going      PT LONG TERM GOAL #2   Title  Patient able to sit without pain.    Time  4    Period  Weeks    Status  On-going      PT LONG TERM GOAL #3   Title  Patient able to sleep without waking from pain.    Time  4    Period  Weeks    Status  On-going      PT LONG TERM GOAL #4   Title  Patient able to descend stairs with good quad control.    Time  4    Period  Weeks    Status  Achieved      PT LONG TERM GOAL #5   Title  Improved SLS to 10 sec bil    Time  4    Period  Weeks    Status  Achieved            Plan - 04/22/19 1051    Clinical Impression Statement  Balance much improved, with pt able to hold SLS now 25-30 sec.  Pt reporting reduction of tightness and pain (overall) in thighs. Treatment tolerated well,  reporting reduced pain by end of session.  Pt has made good progress towards remaining goals.    Rehab Potential  Excellent    PT Frequency  2x / week    PT Duration  4 weeks    PT Treatment/Interventions  ADLs/Self Care Home Management;Cryotherapy;Electrical Stimulation;Moist Heat;Iontophoresis 4mg /ml Dexamethasone;Ultrasound;Stair training;Therapeutic activities;Therapeutic exercise;Balance training;Neuromuscular re-education;Patient/family education;Manual techniques;Dry needling    PT Next Visit Plan  continue manual therapy, strengthening, and stretching of LE.    PT Home Exercise Plan  [id number]    Consulted and Agree with Plan of Care  Patient       Patient will benefit from skilled therapeutic intervention in order to improve the following deficits and impairments:     Visit Diagnosis: Pain in left thigh  Pain in right thigh  Muscle weakness (generalized)     Problem List Patient Active Problem List   Diagnosis Date Noted  . Statin intolerance 07/03/2018  . Itching 04/27/2018  . Chronic pain of both ankles 04/27/2018  . Bilateral carpal tunnel syndrome 04/27/2018  . Raynaud's phenomenon without gangrene 02/03/2018  . Mitral regurgitation 12/19/2017  . Tricuspid regurgitation 12/19/2017  . Orthostatic hypotension 12/16/2017  . Syncope and collapse 12/16/2017  . Essential hypertension 12/10/2017  . Concussion 12/08/2017  . Flatulence 09/28/2017  . Fear of flying 09/28/2017  . Trochanteric bursitis 06/27/2017  . Trigger finger, left ring finger 06/26/2017  . Chronic pain of both knees 06/26/2017  . Carpal tunnel syndrome, right 06/26/2017  . Dry skin dermatitis 06/26/2017  . 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 Elevated serum creatinine 12/10/2016  . Cataract cortical, senile, left 06/04/2016  . 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 Tennis elbow syndrome 03/29/2016  . RLS (restless legs syndrome) 03/29/2016  . Right  shoulder pain 03/29/2016  . Insomnia 08/08/2015  .  Right leg pain 08/08/2015  . Shoulder bursitis 08/08/2015  . Memory changes 05/05/2015  . Seborrheic dermatitis 03/14/2015  . BPPV (benign paroxysmal positional vertigo), right 11/02/2014  . Microalbuminuria 11/02/2014  . Raynaud's syndrome 08/09/2013  . Diverticulosis 06/07/2013  . Avitaminosis D 10/09/2012  . Colon polyp 04/24/2011  . Combined hyperlipidemia associated with type 2 diabetes mellitus ([place]) 04/24/2011  . Diabetes mellitus, type II ([place]) 11/09/2007  . 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 CHOLELITHIASIS, HX OF 11/09/2007   Kerin Perna, PTA 04/22/19 10:57 AM  [facility] [place], [place], [address] Phone: [phone]   Fax:  [phone]  Name: Ann Griffith MRN: [id number] Date of Birth: [birth year]

## 2019-04-30 ENCOUNTER — Ambulatory Visit: Payer: Medicare HMO | Admitting: Physician Assistant

## 2019-05-07 ENCOUNTER — Encounter: Payer: Self-pay | Admitting: Physical Therapy

## 2019-05-07 ENCOUNTER — Other Ambulatory Visit: Payer: Self-pay

## 2019-05-07 ENCOUNTER — Ambulatory Visit: Payer: Medicare HMO | Admitting: Physical Therapy

## 2019-05-07 ENCOUNTER — Ambulatory Visit: Payer: Medicare HMO | Admitting: Family Medicine

## 2019-05-07 ENCOUNTER — Encounter: Payer: Self-pay | Admitting: Physician Assistant

## 2019-05-07 DIAGNOSIS — M79652 Pain in left thigh: Secondary | ICD-10-CM

## 2019-05-07 DIAGNOSIS — M6281 Muscle weakness (generalized): Secondary | ICD-10-CM

## 2019-05-07 DIAGNOSIS — M79651 Pain in right thigh: Secondary | ICD-10-CM | POA: Diagnosis not present

## 2019-05-07 NOTE — Therapy (Addendum)
[facility] [place], [place], [address] Phone: [phone]   Fax:  [phone]  Physical Therapy Treatment  Patient Details  Name: Ann Griffith MRN: [id number] Date of Birth: Jan 04, 1951 Referring Provider (PT): Lynne Leader   Encounter Date: 05/07/2019  PT End of Session - 05/07/19 0805    Visit Number  5    Number of Visits  8    Date for PT Re-Evaluation  05/10/19    Authorization Type  A[employer]na/MCR    PT Start Time  0803    PT Stop Time  0844    PT Time Calculation (min)  41 min    Activity Tolerance  Patient tolerated treatment well    Behavior During Therapy  [facility] for tasks assessed/performed       Past Medical History:  Diagnosis Date  . CHOLELITHIASIS, HX OF 11/09/2007   Qualifier: Diagnosis of  By: [name] Mcalpine    . DIABETES MELLITUS, TYPE II 11/09/2007   Qualifier: Diagnosis of  By: [name] Mcalpine    . Diverticulosis   . Hyperlipidemia     Past Surgical History:  Procedure Laterality Date  . bone spur removed  2003   left heel  . CATARACT EXTRACTION Left 02/2016  . CHOLECYSTECTOMY    . COLONOSCOPY  08/2003    There were no vitals filed for this visit.  Subjective Assessment - 05/07/19 0806    Subjective  Pt reports she had pain in Rt thigh during flight to [place].  She has had some pain in thighs during trip.  She did her HEP, but not every day.  She is still taking gabapentin at night.  She verbalized readiness to d/c or hold after today's visit.    Patient Stated Goals  get rid of pain    Currently in Pain?  Yes    Pain Score  2     Pain Location  Leg   thighs.   Pain Orientation  Left;Right    Pain Descriptors / Indicators  Dull;Tightness    Pain Relieving Factors  medicine         [facility] PT Assessment - 05/07/19 0001      Assessment   Medical Diagnosis  chronic bil knee pain, bil thigh pain    Referring Provider (PT)  Lynne Leader    Onset Date/Surgical Date  12/24/17     Hand Dominance  Right    Next MD Visit  05/07/19    Prior Therapy  no      Observation/Other Assessments   Focus on Therapeutic Outcomes (FOTO)   14% limited.       Single Leg Stance   Comments  LLE: 25 sec; RLE: 30 sec        [facility] Adult PT Treatment/Exercise - 05/07/19 0001      Knee/Hip Exercises: Stretches   Passive Hamstring Stretch  Right;Left;2 reps;30 seconds   seated    Quad Stretch  Right;Left;2 reps;30 seconds   seated, foot under chair.    Gastroc Stretch  Both;2 reps;30 seconds   incline board     Knee/Hip Exercises: Aerobic   Recumbent Bike  L1: 5.5 min       Knee/Hip Exercises: Standing   Step Down  Right;Left;1 set;Step Height: 6";Hand Hold: 2;15 reps    Step Down Limitations  cues to slow down     SLS  1 rep of SLS each leg, without UE support x 25-30 sec  Manual Therapy   Manual therapy comments  pt in hooklying, supported with black bolster    Soft tissue mobilization  STM to bilat quads to decrease fascial tightness.     Myofascial Release  MFR to Rt/Lt lateral quad         PT Long Term Goals - 05/07/19 0263      PT LONG TERM GOAL #1   Title  Ind with HEP for strength, flexibility and balance    Time  4    Period  Weeks    Status  On-going      PT LONG TERM GOAL #2   Title  Patient able to sit without pain.    Baseline  can sit short periods; plane rides increases pain.    Time  4    Period  Weeks    Status  Partially Met      PT LONG TERM GOAL #3   Title  Patient able to sleep without waking from pain.    Time  4    Period  Weeks    Status  Achieved      PT LONG TERM GOAL #4   Title  Patient able to descend stairs with good quad control.    Time  4    Period  Weeks    Status  Achieved      PT LONG TERM GOAL #5   Title  Improved SLS to 10 sec bil    Time  4    Period  Weeks    Status  Achieved            Plan - 05/07/19 1037    Clinical Impression Statement  Pt has reported improvement in leg pain since  initiating therapy.  She continues to have pain with prolonged sitting (like plane or car rides), but much improved.  HEP was reviewed this visit.  Pt verbalized desire to hold therapy for 3 wks while she continues HEP.  Pt has met most of her goals.    Rehab Potential  Excellent    PT Frequency  2x / week    PT Duration  4 weeks    PT Treatment/Interventions  ADLs/Self Care Home Management;Cryotherapy;Electrical Stimulation;Moist Heat;Iontophoresis 64m/ml Dexamethasone;Ultrasound;Stair training;Therapeutic activities;Therapeutic exercise;Balance training;Neuromuscular re-education;Patient/family education;Manual techniques;Dry needling    PT Next Visit Plan  will hold therapy per pt request until 05/28/19; if pt doesn't return before then will d/c to HEP.    PT Home Exercise Plan  [id number]   Consulted and Agree with Plan of Care  Patient       Patient will benefit from skilled therapeutic intervention in order to improve the following deficits and impairments:     Visit Diagnosis: Pain in left thigh  Pain in right thigh  Muscle weakness (generalized)     Problem List Patient Active Problem List   Diagnosis Date Noted  . Statin intolerance 07/03/2018  . Itching 04/27/2018  . Chronic pain of both ankles 04/27/2018  . Bilateral carpal tunnel syndrome 04/27/2018  . Raynaud's phenomenon without gangrene 02/03/2018  . Mitral regurgitation 12/19/2017  . Tricuspid regurgitation 12/19/2017  . Orthostatic hypotension 12/16/2017  . Syncope and collapse 12/16/2017  . Essential hypertension 12/10/2017  . Concussion 12/08/2017  . Flatulence 09/28/2017  . Fear of flying 09/28/2017  . Trochanteric bursitis 06/27/2017  . Trigger finger, left ring finger 06/26/2017  . Chronic pain of both knees 06/26/2017  . Carpal tunnel syndrome, right 06/26/2017  . Dry skin dermatitis 06/26/2017  .  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 Elevated serum creatinine 12/10/2016  .  Cataract cortical, senile, left 06/04/2016  . 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 Tennis elbow syndrome 03/29/2016  . RLS (restless legs syndrome) 03/29/2016  . Right shoulder pain 03/29/2016  . Insomnia 08/08/2015  . Right leg pain 08/08/2015  . Shoulder bursitis 08/08/2015  . Memory changes 05/05/2015  . Seborrheic dermatitis 03/14/2015  . BPPV (benign paroxysmal positional vertigo), right 11/02/2014  . Microalbuminuria 11/02/2014  . Raynaud's syndrome 08/09/2013  . Diverticulosis 06/07/2013  . Avitaminosis D 10/09/2012  . Colon polyp 04/24/2011  . Combined hyperlipidemia associated with type 2 diabetes mellitus ([place]) 04/24/2011  . Diabetes mellitus, type II ([place]) 11/09/2007  . 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 CHOLELITHIASIS, HX OF 11/09/2007   Kerin Perna, PTA 05/07/19 10:44 AM  [facility] [place], [place], [address] Phone: [phone]   Fax:  [phone]  Name: Ann Griffith MRN: [id number] Date of Birth: [birth year]  PHYSICAL THERAPY DISCHARGE SUMMARY  Visits from Start of Care: 5  Current functional level related to goals / functional outcomes: See above   Remaining deficits: See above   Education / Equipment: HEP  Plan: Patient agrees to discharge.  Patient goals were partially met. Patient is being discharged due to being pleased with the current functional level.  ?????    Madelyn Flavors, PT 08/10/19 3:14 PM  [facility] [place] [address] [place], [place] [address]  [phone] (office) [phone] (fax)

## 2019-05-10 ENCOUNTER — Other Ambulatory Visit: Payer: Self-pay | Admitting: Physician Assistant

## 2019-05-10 DIAGNOSIS — G2581 Restless legs syndrome: Secondary | ICD-10-CM

## 2019-05-11 ENCOUNTER — Encounter: Payer: Medicare HMO | Admitting: Physical Therapy

## 2019-05-13 NOTE — Telephone Encounter (Signed)
Do either of you have a suggestion for where she should go to get this testing to make sure has it back in 72hrs?

## 2019-05-14 ENCOUNTER — Other Ambulatory Visit: Payer: Self-pay | Admitting: Physician Assistant

## 2019-05-14 DIAGNOSIS — G2581 Restless legs syndrome: Secondary | ICD-10-CM

## 2019-05-14 NOTE — Telephone Encounter (Signed)
Please notify patient: pMost of the swabs that we are doing here in the office take 2 to 3-day turnaround.  But there is no guarantee.  I am not aware of any site that is doing any outpatient rapid testing with same-day turnaround.

## 2019-05-31 ENCOUNTER — Ambulatory Visit (INDEPENDENT_AMBULATORY_CARE_PROVIDER_SITE_OTHER): Payer: Medicare HMO | Admitting: Physician Assistant

## 2019-05-31 ENCOUNTER — Encounter: Payer: Self-pay | Admitting: Physician Assistant

## 2019-05-31 ENCOUNTER — Other Ambulatory Visit: Payer: Self-pay

## 2019-05-31 VITALS — BP 148/72 | HR 68 | Ht 66.5 in | Wt 176.0 lb

## 2019-05-31 DIAGNOSIS — E118 Type 2 diabetes mellitus with unspecified complications: Secondary | ICD-10-CM | POA: Diagnosis not present

## 2019-05-31 DIAGNOSIS — I7300 Raynaud's syndrome without gangrene: Secondary | ICD-10-CM | POA: Diagnosis not present

## 2019-05-31 DIAGNOSIS — G2581 Restless legs syndrome: Secondary | ICD-10-CM

## 2019-05-31 DIAGNOSIS — Z23 Encounter for immunization: Secondary | ICD-10-CM | POA: Diagnosis not present

## 2019-05-31 LAB — POCT GLYCOSYLATED HEMOGLOBIN (HGB A1C): Hemoglobin A1C: 6.7 % — AB (ref 4.0–5.6)

## 2019-05-31 MED ORDER — ROPINIROLE HCL 0.25 MG PO TABS: ORAL_TABLET | ORAL | 3 refills | Status: DC

## 2019-05-31 MED ORDER — AMLODIPINE BESYLATE 2.5 MG PO TABS: 2.5000 mg | ORAL_TABLET | Freq: Every day | ORAL | 1 refills | Status: DC

## 2019-05-31 MED ORDER — AMBULATORY NON FORMULARY MEDICATION: 0 refills | Status: DC

## 2019-05-31 NOTE — Progress Notes (Signed)
Subjective:    Patient ID: Ann Griffith, female    DOB: 08-11-1951, 68 y.o.   MRN: [id number]  HPI Pt is a 68 yo female with HTN, T2DM, HLD, insomnia who presents to the clinic for 3 month follow up.   Pt is not checking her sugars. She wonders about freestyle libre. She denies any open wounds. No hypoglycemia.   Pt is compliant with medications.   RLS- controlled with requip.   Insomnia- controlled with trazodone.   COVID concerns wonders about vitamin D/zinc/ok to go to the gym.   .. Active Ambulatory Problems    Diagnosis Date Noted  . Diabetes mellitus, type II ([place]) 11/09/2007  . 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 CHOLELITHIASIS, HX OF 11/09/2007  . Diverticulosis 06/07/2013  . Raynaud's syndrome 08/09/2013  . BPPV (benign paroxysmal positional vertigo), right 11/02/2014  . Microalbuminuria 11/02/2014  . Seborrheic dermatitis 03/14/2015  . Memory changes 05/05/2015  . Insomnia 08/08/2015  . Right leg pain 08/08/2015  . Shoulder bursitis 08/08/2015  . Avitaminosis D 10/09/2012  . Colon polyp 04/24/2011  . Tennis elbow syndrome 03/29/2016  . RLS (restless legs syndrome) 03/29/2016  . Right shoulder pain 03/29/2016  . 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 Cataract cortical, senile, left 06/04/2016  . Elevated serum creatinine 12/10/2016  . 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 Trigger finger, left ring finger 06/26/2017  . Chronic pain of both knees 06/26/2017  . Carpal tunnel syndrome, right 06/26/2017  . Dry skin dermatitis 06/26/2017  . Trochanteric bursitis 06/27/2017  . Flatulence 09/28/2017  . Fear of flying 09/28/2017  . Orthostatic hypotension 12/16/2017  . Syncope and collapse 12/16/2017  . Mitral regurgitation 12/19/2017  . Tricuspid regurgitation 12/19/2017  . Raynaud's phenomenon without gangrene 02/03/2018  . Concussion 12/08/2017  . Essential hypertension 12/10/2017  . Combined hyperlipidemia  associated with type 2 diabetes mellitus ([place]) 04/24/2011  . Itching 04/27/2018  . Chronic pain of both ankles 04/27/2018  . Bilateral carpal tunnel syndrome 04/27/2018  . Statin intolerance 07/03/2018   Resolved Ambulatory Problems    Diagnosis Date Noted  . Eustachian tube dysfunction 05/02/2014  . Hyperlipidemia 11/03/2014  . Controlled type 2 diabetes mellitus without complication, without long-term current use of insulin ([place]) 06/26/2017  . Nausea 12/16/2017  . Hematoma and contusion 12/16/2017   Past Medical History:  Diagnosis Date  . DIABETES MELLITUS, TYPE II 11/09/2007      Review of Systems  All other systems reviewed and are negative.      Objective:   Physical Exam Vitals signs reviewed.  Constitutional:      Appearance: Normal appearance.  HENT:     Head: Normocephalic.  Cardiovascular:     Rate and Rhythm: Normal rate and regular rhythm.     Heart sounds: Murmur present.  Pulmonary:     Effort: Pulmonary effort is normal.     Breath sounds: Normal breath sounds.  Neurological:     General: No focal deficit present.     Mental Status: She is alert and oriented to person, place, and time.  Psychiatric:        Mood and Affect: Mood normal.        Behavior: Behavior normal.           Assessment & Plan:  [name] was seen today for diabetes.  Diagnoses and all orders for this visit:  Type 2 diabetes mellitus with complication, without long-term current use of insulin ([place]) -  POCT glycosylated hemoglobin (Hb A1C)  Flu vaccine need -     Flu Vaccine QUAD High Dose(Fluad)  Raynaud's phenomenon without gangrene -     amLODipine (NORVASC) 2.5 MG tablet; Take 1-2 tablets (2.5-5 mg total) by mouth daily.  RLS (restless legs syndrome) -     rOPINIRole (REQUIP) 0.25 MG tablet; TAKE 1-4 TABLETS BY MOUTH AT BEDTIME.  Need for shingles vaccine -     AMBULATORY NON FORMULARY MEDICATION; shingrx 2 doses to prevent shingles.   .. Lab Results   Component Value Date   HGBA1C 6.7 (A) 05/31/2019   Under 7 controlled but up some.  Continue on metformin.  Discussed libre. Pt declined after finding out details.  On ACE. BP not to goal. No hx of elevation like this. Monitor at home. Report readings. If elevated at next visit increase lisinopril.  On STATIN. Flu shot given today.  Pneumonia shot UTD. UTD eye exam.   Shingles vaccine rx given.   Refilled medications.   Discussed risk of using [employer] gyms vs benefit.  [place] for increase in vitamin D/zinc.  Follow up in 3 months.

## 2019-06-07 DIAGNOSIS — E119 Type 2 diabetes mellitus without complications: Secondary | ICD-10-CM | POA: Diagnosis not present

## 2019-06-07 DIAGNOSIS — E7800 Pure hypercholesterolemia, unspecified: Secondary | ICD-10-CM | POA: Diagnosis not present

## 2019-06-07 DIAGNOSIS — Z0100 Encounter for examination of eyes and vision without abnormal findings: Secondary | ICD-10-CM | POA: Diagnosis not present

## 2019-06-07 DIAGNOSIS — Z135 Encounter for screening for eye and ear disorders: Secondary | ICD-10-CM | POA: Diagnosis not present

## 2019-06-07 DIAGNOSIS — D23121 Other benign neoplasm of skin of left upper eyelid, including canthus: Secondary | ICD-10-CM | POA: Diagnosis not present

## 2019-06-07 DIAGNOSIS — I10 Essential (primary) hypertension: Secondary | ICD-10-CM | POA: Diagnosis not present

## 2019-06-07 DIAGNOSIS — H2511 Age-related nuclear cataract, right eye: Secondary | ICD-10-CM | POA: Diagnosis not present

## 2019-07-20 DIAGNOSIS — Z0100 Encounter for examination of eyes and vision without abnormal findings: Secondary | ICD-10-CM | POA: Diagnosis not present

## 2019-07-27 ENCOUNTER — Other Ambulatory Visit: Payer: Self-pay | Admitting: Physician Assistant

## 2019-07-27 DIAGNOSIS — M25562 Pain in left knee: Secondary | ICD-10-CM

## 2019-07-27 DIAGNOSIS — M25572 Pain in left ankle and joints of left foot: Secondary | ICD-10-CM

## 2019-07-27 DIAGNOSIS — G8929 Other chronic pain: Secondary | ICD-10-CM

## 2019-08-16 DIAGNOSIS — Z961 Presence of intraocular lens: Secondary | ICD-10-CM | POA: Diagnosis not present

## 2019-08-16 DIAGNOSIS — H2511 Age-related nuclear cataract, right eye: Secondary | ICD-10-CM | POA: Diagnosis not present

## 2019-08-16 DIAGNOSIS — H43811 Vitreous degeneration, right eye: Secondary | ICD-10-CM | POA: Diagnosis not present

## 2019-08-16 DIAGNOSIS — E119 Type 2 diabetes mellitus without complications: Secondary | ICD-10-CM | POA: Diagnosis not present

## 2019-08-16 DIAGNOSIS — D23112 Other benign neoplasm of skin of right lower eyelid, including canthus: Secondary | ICD-10-CM | POA: Diagnosis not present

## 2019-08-16 DIAGNOSIS — D23121 Other benign neoplasm of skin of left upper eyelid, including canthus: Secondary | ICD-10-CM | POA: Diagnosis not present

## 2019-08-26 ENCOUNTER — Other Ambulatory Visit: Payer: Self-pay | Admitting: Physician Assistant

## 2019-08-26 DIAGNOSIS — I7300 Raynaud's syndrome without gangrene: Secondary | ICD-10-CM

## 2019-09-01 ENCOUNTER — Ambulatory Visit (INDEPENDENT_AMBULATORY_CARE_PROVIDER_SITE_OTHER): Payer: Medicare HMO | Admitting: Physician Assistant

## 2019-09-01 ENCOUNTER — Encounter: Payer: Self-pay | Admitting: Physician Assistant

## 2019-09-01 ENCOUNTER — Other Ambulatory Visit: Payer: Self-pay

## 2019-09-01 VITALS — BP 130/48 | HR 73 | Ht 66.5 in | Wt 176.0 lb

## 2019-09-01 DIAGNOSIS — E118 Type 2 diabetes mellitus with unspecified complications: Secondary | ICD-10-CM | POA: Diagnosis not present

## 2019-09-01 DIAGNOSIS — R413 Other amnesia: Secondary | ICD-10-CM

## 2019-09-01 DIAGNOSIS — E785 Hyperlipidemia, unspecified: Secondary | ICD-10-CM

## 2019-09-01 DIAGNOSIS — R252 Cramp and spasm: Secondary | ICD-10-CM | POA: Diagnosis not present

## 2019-09-01 DIAGNOSIS — G2581 Restless legs syndrome: Secondary | ICD-10-CM

## 2019-09-01 DIAGNOSIS — I10 Essential (primary) hypertension: Secondary | ICD-10-CM | POA: Diagnosis not present

## 2019-09-01 DIAGNOSIS — I7300 Raynaud's syndrome without gangrene: Secondary | ICD-10-CM

## 2019-09-01 LAB — POCT GLYCOSYLATED HEMOGLOBIN (HGB A1C): Hemoglobin A1C: 6.7 % — AB (ref 4.0–5.6)

## 2019-09-01 MED ORDER — AMLODIPINE BESYLATE 2.5 MG PO TABS: 2.5000 mg | ORAL_TABLET | Freq: Every day | ORAL | 0 refills | Status: DC

## 2019-09-01 MED ORDER — LISINOPRIL 2.5 MG PO TABS: 2.5000 mg | ORAL_TABLET | Freq: Every day | ORAL | 3 refills | Status: DC

## 2019-09-01 MED ORDER — ROPINIROLE HCL 0.5 MG PO TABS: 0.5000 mg | ORAL_TABLET | Freq: Three times a day (TID) | ORAL | 0 refills | Status: DC

## 2019-09-01 MED ORDER — METFORMIN HCL 500 MG PO TABS: ORAL_TABLET | ORAL | 3 refills | Status: DC

## 2019-09-01 NOTE — Progress Notes (Signed)
Subjective:    Patient ID: Ann Griffith, female    DOB: Mar 23, 1951, 68 y.o.   MRN: [id number]  HPI  Pt is a 69 yo female with T2DM, HTN, RLS, lumbar DDD Ann facet arthritis who presents to the clinic for 3 month follow up.   Pt is concerned with some numbness Ann tingling in both great toes every once in a while. Ann denies any color changes. Ann is having more Ann more leg cramping. Ann thought it was her restless leg but thinks it could be something else. Notices cramps more with standing or walking. Ann is concerned with  Her short term memory changes. Ann has long term memory but forgets things told to her Ann things to get at grocery store. Ann had a issue with memory changes with lipitor before Ann wonders if it could be crestor. Ann is trying to do more puzzles Ann word searches.   Ann is checking her sugars Ann fasting are running 145. No hypoglycemia. Ann is taking metformin daily.   .. Active Ambulatory Problems    Diagnosis Date Noted  . Type 2 diabetes mellitus with complication, without long-term current use of insulin ([place]) 11/09/2007  . 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 CHOLELITHIASIS, HX OF 11/09/2007  . Diverticulosis 06/07/2013  . Raynaud's syndrome 08/09/2013  . BPPV (benign paroxysmal positional vertigo), right 11/02/2014  . Microalbuminuria 11/02/2014  . Hyperlipidemia LDL goal <70 11/03/2014  . Seborrheic dermatitis 03/14/2015  . Memory changes 05/05/2015  . Insomnia 08/08/2015  . Right leg pain 08/08/2015  . Shoulder bursitis 08/08/2015  . Avitaminosis D 10/09/2012  . Colon polyp 04/24/2011  . Tennis elbow syndrome 03/29/2016  . RLS (restless legs syndrome) 03/29/2016  . Right shoulder pain 03/29/2016  . 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 Cataract cortical, senile, left 06/04/2016  . Elevated serum creatinine 12/10/2016  . 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 Trigger finger, left ring finger 06/26/2017  .  Chronic pain of both knees 06/26/2017  . Carpal tunnel syndrome, right 06/26/2017  . Dry skin dermatitis 06/26/2017  . Trochanteric bursitis 06/27/2017  . Flatulence 09/28/2017  . Fear of flying 09/28/2017  . Orthostatic hypotension 12/16/2017  . Syncope Ann collapse 12/16/2017  . Mitral regurgitation 12/19/2017  . Tricuspid regurgitation 12/19/2017  . Raynaud's phenomenon without gangrene 02/03/2018  . Concussion 12/08/2017  . Essential hypertension 12/10/2017  . Combined hyperlipidemia associated with type 2 diabetes mellitus ([place]) 04/24/2011  . Itching 04/27/2018  . Chronic pain of both ankles 04/27/2018  . Bilateral carpal tunnel syndrome 04/27/2018  . Statin intolerance 07/03/2018  . Leg cramping 09/03/2019   Resolved Ambulatory Problems    Diagnosis Date Noted  . Eustachian tube dysfunction 05/02/2014  . Controlled type 2 diabetes mellitus without complication, without long-term current use of insulin ([place]) 06/26/2017  . Nausea 12/16/2017  . Hematoma Ann contusion 12/16/2017   Past Medical History:  Diagnosis Date  . DIABETES MELLITUS, TYPE II 11/09/2007  . Hyperlipidemia       Review of Systems See HPI.     Objective:   Physical Exam Vitals reviewed.  Constitutional:      Appearance: Normal appearance.  Cardiovascular:     Rate Ann Rhythm: Normal rate Ann regular rhythm.  Pulmonary:     Effort: Pulmonary effort is normal.     Breath sounds: Normal breath sounds.  Musculoskeletal:     Cervical back: Normal range of motion.     Comments: No calf pain.  No bruising.   Foot exam: No neuropathy.  Good pedal pulses 2+  Skin:    General: Skin is warm.  Neurological:     General: No focal deficit present.     Mental Status: Ann Griffith, Ann Griffith, Ann Griffith.  Psychiatric:        Mood Ann Affect: Mood normal.           Assessment & Plan:  [name] was seen today for diabetes.  Diagnoses Ann all orders for this visit:  Type 2  diabetes mellitus with complication, without long-term current use of insulin ([facility]) -     POCT glycosylated hemoglobin (Hb A1C) -     COMPLETE METABOLIC PANEL WITH GFR -     lisinopril (ZESTRIL) 2.5 MG tablet; Take 1 tablet (2.5 mg total) by mouth daily. -     metFORMIN (GLUCOPHAGE) 500 MG tablet; TAKE 2 TABLETS BY MOUTH EVERY DAY WITH BREAKFAST  Raynaud's phenomenon without gangrene -     amLODipine (NORVASC) 2.5 MG tablet; Take 1-2 tablets (2.5-5 mg total) by mouth daily.  Memory changes -     COMPLETE METABOLIC PANEL WITH GFR -     RPR -     Vitamin B12 -     Sedimentation rate -     CBC -     TSH -     Folate  Leg cramping -     VAS [place] ABI WITH/WO TBI -     COMPLETE METABOLIC PANEL WITH GFR  Hyperlipidemia LDL goal <70 -     Lipid Panel w/reflex Direct LDL -     COMPLETE METABOLIC PANEL WITH GFR  Essential hypertension  RLS (restless legs syndrome) -     rOPINIRole (REQUIP) 0.5 MG tablet; Take 1 tablet (0.5 mg total) by mouth 3 (three) times daily.    6.3 to 6.5  Lab Results  Component Value Date   HGBA1C 6.7 (A) 09/01/2019   A1C to goal Ann stable but steadly increase.  Continue on same medications.  Last LDL 117 continue on statin. Recheck today.  On ACE. BP almost to goal.  Flu Ann pneumonia shot UTD.   .. Diabetic Foot Exam - Simple   Simple Foot Form Visual Inspection No deformities, no ulcerations, no other skin breakdown bilaterally: Yes Sensation Testing Intact to touch Ann monofilament testing bilaterally: Yes Pulse Check Posterior Tibialis Ann Dorsalis pulse intact bilaterally: Yes Comments Good capillary refill less than 2 seconds.  Pedal pulses 2+.     Increased requip.  Will get ABI for leg cramping. Concerned about crestor causing leg cramps Ann memory issues. MMSE was perfect.dementia labs ordered.  Reassured patient. Lets see with requip Ann ABI shows to discuss crestor with a trial to come off.   .. MMSE - Mini Mental State Exam  09/01/2019  Orientation to Griffith 5  Orientation to Ann Griffith 5  Registration 3  Attention/ Calculation 5  Recall 3  Language- name 2 objects 2  Language- repeat 1  Language- follow 3 step command 3  Language- read & follow direction 1  Write a sentence 1  Copy design 1  Total score 30    Follow up as needed or in 3 months.

## 2019-09-01 NOTE — Patient Instructions (Signed)
Will get test to look for peripheral vascular disease.  Will get labs today.  Increased requip.

## 2019-09-02 LAB — CBC
HCT: 33.4 % — ABNORMAL LOW (ref 35.0–45.0)
Hemoglobin: 11.3 g/dL — ABNORMAL LOW (ref 11.7–15.5)
MCH: 32.9 pg (ref 27.0–33.0)
MCHC: 33.8 g/dL (ref 32.0–36.0)
MCV: 97.4 fL (ref 80.0–100.0)
MPV: 11 fL (ref 7.5–12.5)
Platelets: 207 10*3/uL (ref 140–400)
RBC: 3.43 10*6/uL — ABNORMAL LOW (ref 3.80–5.10)
RDW: 12.3 % (ref 11.0–15.0)
WBC: 5.4 10*3/uL (ref 3.8–10.8)

## 2019-09-02 LAB — COMPLETE METABOLIC PANEL WITH GFR
AG Ratio: 1.9 (calc) (ref 1.0–2.5)
ALT: 23 U/L (ref 6–29)
AST: 19 U/L (ref 10–35)
Albumin: 4.5 g/dL (ref 3.6–5.1)
Alkaline phosphatase (APISO): 40 U/L (ref 37–153)
BUN/Creatinine Ratio: 14 (calc) (ref 6–22)
BUN: 15 mg/dL (ref 7–25)
CO2: 26 mmol/L (ref 20–32)
Calcium: 9.8 mg/dL (ref 8.6–10.4)
Chloride: 104 mmol/L (ref 98–110)
Creat: 1.06 mg/dL — ABNORMAL HIGH (ref 0.50–0.99)
GFR, Est African American: 62 mL/min/{1.73_m2} (ref >=60)
GFR, Est Non African American: 54 mL/min/{1.73_m2} — ABNORMAL LOW (ref >=60)
Globulin: 2.4 g/dL (calc) (ref 1.9–3.7)
Glucose, Bld: 141 mg/dL — ABNORMAL HIGH (ref 65–99)
Potassium: 4.1 mmol/L (ref 3.5–5.3)
Sodium: 139 mmol/L (ref 135–146)
Total Bilirubin: 0.4 mg/dL (ref 0.2–1.2)
Total Protein: 6.9 g/dL (ref 6.1–8.1)

## 2019-09-02 LAB — LIPID PANEL W/REFLEX DIRECT LDL
Cholesterol: 174 mg/dL (ref <200)
HDL: 74 mg/dL (ref >=50)
LDL Cholesterol (Calc): 83 mg/dL (calc)
Non-HDL Cholesterol (Calc): 100 mg/dL (calc) (ref <130)
Total CHOL/HDL Ratio: 2.4 (calc) (ref <5.0)
Triglycerides: 82 mg/dL (ref <150)

## 2019-09-02 LAB — SEDIMENTATION RATE: Sed Rate: 9 mm/h (ref 0–30)

## 2019-09-02 LAB — TSH: TSH: 2.24 mIU/L (ref 0.40–4.50)

## 2019-09-02 LAB — FOLATE: Folate: 13.9 ng/mL

## 2019-09-02 LAB — VITAMIN B12: Vitamin B-12: >2000 pg/mL — ABNORMAL HIGH (ref 200–1100)

## 2019-09-02 LAB — RPR: RPR Ser Ql: NONREACTIVE

## 2019-09-03 DIAGNOSIS — R252 Cramp and spasm: Secondary | ICD-10-CM | POA: Insufficient documentation

## 2019-09-03 NOTE — Progress Notes (Signed)
Stormie,   Cholesterol looks MUCH better. Almost to goal. Goal is LDL under 70 for diabetics. You are at 60. Stay on crestor same dose for now. Keep making good diet choices. Increase you iron rich foods. Hemoglobin just a little on low side.   Thyroid looks great. Kidney function is stable. Liver looks great.   B12 is too high. If you are taking a b supplement cut this in half.

## 2019-09-06 ENCOUNTER — Ambulatory Visit (HOSPITAL_COMMUNITY)
Admission: RE | Admit: 2019-09-06 | Discharge: 2019-09-06 | Disposition: A | Discharge status: Home / Self Care | Payer: Medicare HMO | Source: Ambulatory Visit | Attending: Physician Assistant | Admitting: Physician Assistant

## 2019-09-06 ENCOUNTER — Other Ambulatory Visit: Payer: Self-pay

## 2019-09-06 DIAGNOSIS — R252 Cramp and spasm: Secondary | ICD-10-CM | POA: Diagnosis not present

## 2019-09-06 NOTE — Progress Notes (Signed)
Great circulation normal ABI. No PVD.

## 2019-09-06 NOTE — Progress Notes (Signed)
ABI completed.  Preliminary results can be found under CV proc under chart review.  09/06/2019 2:40 PM  [place] Serpe, K., RDMS, RVT

## 2019-09-17 DIAGNOSIS — D23121 Other benign neoplasm of skin of left upper eyelid, including canthus: Secondary | ICD-10-CM | POA: Diagnosis not present

## 2019-09-17 DIAGNOSIS — D23112 Other benign neoplasm of skin of right lower eyelid, including canthus: Secondary | ICD-10-CM | POA: Diagnosis not present

## 2019-09-17 DIAGNOSIS — R238 Other skin changes: Secondary | ICD-10-CM | POA: Diagnosis not present

## 2019-09-17 DIAGNOSIS — L821 Other seborrheic keratosis: Secondary | ICD-10-CM | POA: Diagnosis not present

## 2019-09-17 DIAGNOSIS — D2310 Other benign neoplasm of skin of unspecified eyelid, including canthus: Secondary | ICD-10-CM | POA: Diagnosis not present

## 2019-09-17 LAB — HM DIABETES EYE EXAM

## 2019-09-28 ENCOUNTER — Encounter: Payer: Self-pay | Admitting: Physician Assistant

## 2019-11-11 ENCOUNTER — Other Ambulatory Visit: Payer: Self-pay | Admitting: Physician Assistant

## 2019-11-11 DIAGNOSIS — F5101 Primary insomnia: Secondary | ICD-10-CM

## 2019-11-25 ENCOUNTER — Other Ambulatory Visit: Payer: Self-pay | Admitting: Physician Assistant

## 2019-11-25 DIAGNOSIS — G2581 Restless legs syndrome: Secondary | ICD-10-CM

## 2019-11-25 DIAGNOSIS — I10 Essential (primary) hypertension: Secondary | ICD-10-CM

## 2019-11-25 DIAGNOSIS — I7300 Raynaud's syndrome without gangrene: Secondary | ICD-10-CM

## 2019-11-30 ENCOUNTER — Ambulatory Visit (INDEPENDENT_AMBULATORY_CARE_PROVIDER_SITE_OTHER): Payer: Medicare HMO | Admitting: Physician Assistant

## 2019-11-30 ENCOUNTER — Other Ambulatory Visit: Payer: Self-pay

## 2019-11-30 VITALS — BP 140/64 | HR 69 | Ht 66.5 in | Wt 176.0 lb

## 2019-11-30 DIAGNOSIS — M79674 Pain in right toe(s): Secondary | ICD-10-CM | POA: Diagnosis not present

## 2019-11-30 DIAGNOSIS — E118 Type 2 diabetes mellitus with unspecified complications: Secondary | ICD-10-CM | POA: Diagnosis not present

## 2019-11-30 DIAGNOSIS — I10 Essential (primary) hypertension: Secondary | ICD-10-CM | POA: Diagnosis not present

## 2019-11-30 DIAGNOSIS — E785 Hyperlipidemia, unspecified: Secondary | ICD-10-CM | POA: Diagnosis not present

## 2019-11-30 DIAGNOSIS — G8929 Other chronic pain: Secondary | ICD-10-CM

## 2019-11-30 DIAGNOSIS — M25511 Pain in right shoulder: Secondary | ICD-10-CM

## 2019-11-30 DIAGNOSIS — R252 Cramp and spasm: Secondary | ICD-10-CM | POA: Diagnosis not present

## 2019-11-30 DIAGNOSIS — G2581 Restless legs syndrome: Secondary | ICD-10-CM

## 2019-11-30 DIAGNOSIS — M19011 Primary osteoarthritis, right shoulder: Secondary | ICD-10-CM | POA: Diagnosis not present

## 2019-11-30 LAB — POCT GLYCOSYLATED HEMOGLOBIN (HGB A1C): Hemoglobin A1C: 6.7 % — AB (ref 4.0–5.6)

## 2019-11-30 MED ORDER — FERROUS SULFATE 325 (65 FE) MG PO TBEC: 325.0000 mg | DELAYED_RELEASE_TABLET | Freq: Every day | ORAL | 3 refills | Status: DC

## 2019-11-30 NOTE — Progress Notes (Signed)
Subjective:    Patient ID: Ann Griffith, female    DOB: 1951-06-21, 69 y.o.   MRN: [id number]  HPI  Patient is a 69 year old female with type 2 diabetes, hypertension, hyperlipidemia, restless leg who presents to the clinic for her 1-month follow-up.  She has gotten her Covid vaccine.  She does not have the lot numbers today.  She does not check her fasting sugars.  She is compliant with her Metformin.  She does try to maintain a healthy diet with low sugar low-carb and walk daily.  She denies any hypoglycemic events.  She denies any open sores or wounds.  She continues to have bilateral thigh pain.  She has been evaluated with Ann Griffith before in the past.  She did some PT which helped minimally.  She denies any low back pain.  The pain seems to be worse at night and better with walking. She wakes up at night to walk at times.   Seems to correlate with how her restless legs feel.  She is on Requip for her restless legs.  She was on gabapentin by Ann Griffith and it did seem to help.  She did run out of this and did not get a refill.  When asked patient does admit to drinking a lot of diet sodas at least 8 a day.  This is both artificial sweeteners and caffeine.  Patient does not remember any injury to her great big toe.  She has had 2 episodes of sharp pain.  There is no redness, swelling, warmth today.  It is not bothering her right now.   Patient also continues to have intermittent right shoulder pain.  Her shoulder will get warm and achy and hurt with range of motion.  Will even feel painful just to let her arm dangle while she is walking.  Today there is no pain.  She did have an x-ray back in 2017 which did show some degenerative changes at the [facility] joint.  .. Active Ambulatory Problems    Diagnosis Date Noted  . Type 2 diabetes mellitus with complication, without long-term current use of insulin ([place]) 11/09/2007  . 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 CHOLELITHIASIS, HX OF 11/09/2007  .  Diverticulosis 06/07/2013  . Raynaud's syndrome 08/09/2013  . BPPV (benign paroxysmal positional vertigo), right 11/02/2014  . Microalbuminuria 11/02/2014  . Hyperlipidemia LDL goal <70 11/03/2014  . Seborrheic dermatitis 03/14/2015  . Memory changes 05/05/2015  . Insomnia 08/08/2015  . Right leg pain 08/08/2015  . Shoulder bursitis 08/08/2015  . Avitaminosis D 10/09/2012  . Colon polyp 04/24/2011  . Tennis elbow syndrome 03/29/2016  . RLS (restless legs syndrome) 03/29/2016  . Right shoulder pain 03/29/2016  . 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 Cataract cortical, senile, left 06/04/2016  . Elevated serum creatinine 12/10/2016  . 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 Trigger finger, left ring finger 06/26/2017  . Chronic pain of both knees 06/26/2017  . Carpal tunnel syndrome, right 06/26/2017  . Dry skin dermatitis 06/26/2017  . Trochanteric bursitis 06/27/2017  . Fear of flying 09/28/2017  . Orthostatic hypotension 12/16/2017  . Syncope and collapse 12/16/2017  . Mitral regurgitation 12/19/2017  . Tricuspid regurgitation 12/19/2017  . Raynaud's phenomenon without gangrene 02/03/2018  . Concussion 12/08/2017  . Essential hypertension 12/10/2017  . Combined hyperlipidemia associated with type 2 diabetes mellitus ([place]) 04/24/2011  . Itching 04/27/2018  . Chronic pain of both ankles 04/27/2018  . Bilateral carpal tunnel syndrome 04/27/2018  .  Statin intolerance 07/03/2018  . Leg cramping 09/03/2019  . Toe pain, right 12/01/2019   Resolved Ambulatory Problems    Diagnosis Date Noted  . Eustachian tube dysfunction 05/02/2014  . Controlled type 2 diabetes mellitus without complication, without long-term current use of insulin ([place]) 06/26/2017  . Flatulence 09/28/2017  . Nausea 12/16/2017  . Hematoma and contusion 12/16/2017   Past Medical History:  Diagnosis Date  . DIABETES MELLITUS, TYPE II 11/09/2007  . Hyperlipidemia       Review of Systems See HPI.     Objective:   Physical Exam Vitals reviewed.  Constitutional:      Appearance: Normal appearance.  HENT:     Head: Normocephalic.  Neck:     Vascular: No carotid bruit.  Cardiovascular:     Rate and Rhythm: Normal rate and regular rhythm.     Pulses: Normal pulses.     Heart sounds: Murmur present.  Pulmonary:     Effort: Pulmonary effort is normal.     Breath sounds: Normal breath sounds.  Musculoskeletal:     Right lower leg: No edema.     Left lower leg: No edema.     Comments: No right toe swelling, redness warmth or pain to palpation today.   No right shoulder swelling, redness, warmth or pain to palpation today. NROM. Strength 5/5 of upper extremity.   Neurological:     General: No focal deficit present.     Mental Status: She is alert and oriented to person, place, and time.  Psychiatric:        Mood and Affect: Mood normal.           Assessment & Plan:  [name] was seen today for diabetes.  Diagnoses and all orders for this visit:  Type 2 diabetes mellitus with complication, without long-term current use of insulin (HCC) -     POCT glycosylated hemoglobin (Hb A1C)  Toe pain, right  Essential hypertension  Hyperlipidemia LDL goal <70  Leg cramping -     ferrous sulfate 325 (65 FE) MG EC tablet; Take 1 tablet (325 mg total) by mouth daily with breakfast.  RLS (restless legs syndrome) -     ferrous sulfate 325 (65 FE) MG EC tablet; Take 1 tablet (325 mg total) by mouth daily with breakfast.  Chronic right shoulder pain  Primary osteoarthritis of right shoulder  .[name] Results for orders placed or performed in visit on 11/30/19  POCT glycosylated hemoglobin (Hb A1C)  Result Value Ref Range   Hemoglobin A1C 6.7 (A) 4.0 - 5.6 %   HbA1c POC (<> result, manual entry)     HbA1c, POC (prediabetic range)     HbA1c, POC (controlled diabetic range)     A1C stable.  Continue same medications.  On ACE. On statin.   .. Diabetic Foot Exam - Simple   Simple Foot Form Diabetic Foot exam was performed with the following findings: Yes 11/30/2019  8:20 AM  Visual Inspection No deformities, no ulcerations, no other skin breakdown bilaterally: Yes Sensation Testing Intact to touch and monofilament testing bilaterally: Yes Pulse Check Posterior Tibialis and Dorsalis pulse intact bilaterally: Yes Comments Patient complains of pain in right big toe    Eye exam UTD.  Flu and pneumonia vaccine UTD.  Covid vaccine done just don't have details.   Reassurance on right great toe. 2 pains since jan 2021. Could be some arthritis pain or some misfiring of nerves.   Bilateral thigh pain has been worked up  before and negative. I think more RLS variant and possible fibromyalgia like pain. Iron can be linked to RLS. Add ferrous sulfate in am. She admits to being out of gabapentin. Restart gabapentin. Drinks a lot of diet cokes. Stop artifical sweeters for 2 weeks and see if you see a difference. Avoid caffiene. HO given.   Follow up with Dr. Darene Lamer with right shoulder. Does have known arthritis. This pain seems more like bursitis, intermittently. See XRay 2017.  Follow up in 3 months.

## 2019-11-30 NOTE — Patient Instructions (Signed)

## 2019-12-01 ENCOUNTER — Encounter: Payer: Self-pay | Admitting: Physician Assistant

## 2019-12-01 DIAGNOSIS — M79674 Pain in right toe(s): Secondary | ICD-10-CM | POA: Insufficient documentation

## 2019-12-07 ENCOUNTER — Ambulatory Visit (INDEPENDENT_AMBULATORY_CARE_PROVIDER_SITE_OTHER): Payer: Medicare HMO

## 2019-12-07 ENCOUNTER — Other Ambulatory Visit: Payer: Self-pay

## 2019-12-07 ENCOUNTER — Encounter: Payer: Self-pay | Admitting: Sports Medicine

## 2019-12-07 ENCOUNTER — Ambulatory Visit (INDEPENDENT_AMBULATORY_CARE_PROVIDER_SITE_OTHER): Payer: Medicare HMO | Admitting: Sports Medicine

## 2019-12-07 DIAGNOSIS — M5030 Other cervical disc degeneration, unspecified cervical region: Secondary | ICD-10-CM | POA: Diagnosis not present

## 2019-12-07 DIAGNOSIS — M50323 Other cervical disc degeneration at C6-C7 level: Secondary | ICD-10-CM | POA: Diagnosis not present

## 2019-12-07 DIAGNOSIS — M47812 Spondylosis without myelopathy or radiculopathy, cervical region: Secondary | ICD-10-CM | POA: Diagnosis not present

## 2019-12-07 MED ORDER — MELOXICAM 15 MG PO TABS: ORAL_TABLET | ORAL | 3 refills | Status: DC

## 2019-12-07 NOTE — Progress Notes (Signed)
    Procedures performed today:    None.  Independent interpretation of notes and tests performed by another provider:   None.  Brief History, Exam, Impression, and Recommendations:    Degenerative disc disease, cervical This pleasant 69 year old female has had neck pain radiating down to her trapezius, right shoulder, going down the arm to the elbow but not past the elbow. Worse with prolonged downgaze, no constitutional symptoms, no trauma, no progressive weakness. Exam is benign from both a shoulder and a neck perspective with the exception of some tenderness at the rhomboids. We will start conservative, meloxicam, cervical spine x-rays, cervical spondylosis rehab exercises, she is on gabapentin for another reason, currently doing an up taper which will be helpful. Return to see me in 4 weeks, MRI if no better. Discontinue Voltaren for now.    ___________________________________________ Gwen Her. Dianah Field, M.D., ABFM., CAQSM. Primary Care and [place] Instructor of [place] of [facility] of Medicine

## 2019-12-07 NOTE — Assessment & Plan Note (Signed)
This pleasant 69 year old female has had neck pain radiating down to her trapezius, right shoulder, going down the arm to the elbow but not past the elbow. Worse with prolonged downgaze, no constitutional symptoms, no trauma, no progressive weakness. Exam is benign from both a shoulder and a neck perspective with the exception of some tenderness at the rhomboids. We will start conservative, meloxicam, cervical spine x-rays, cervical spondylosis rehab exercises, she is on gabapentin for another reason, currently doing an up taper which will be helpful. Return to see me in 4 weeks, MRI if no better. Discontinue Voltaren for now.

## 2020-01-07 ENCOUNTER — Encounter: Payer: Self-pay | Admitting: Sports Medicine

## 2020-01-07 ENCOUNTER — Ambulatory Visit (INDEPENDENT_AMBULATORY_CARE_PROVIDER_SITE_OTHER): Payer: Medicare HMO | Admitting: Sports Medicine

## 2020-01-07 ENCOUNTER — Other Ambulatory Visit: Payer: Self-pay

## 2020-01-07 DIAGNOSIS — M5030 Other cervical disc degeneration, unspecified cervical region: Secondary | ICD-10-CM

## 2020-01-07 MED ORDER — TRAMADOL HCL 50 MG PO TABS: 50.0000 mg | ORAL_TABLET | Freq: Three times a day (TID) | ORAL | 0 refills | Status: DC | PRN

## 2020-01-07 NOTE — Progress Notes (Signed)
    Procedures performed today:    Procedure: Real-time Ultrasound Guided injection of the right subacromial bursa Device: Samsung HS60  Verbal informed consent obtained.  Time-out conducted.  Noted no overlying erythema, induration, or other signs of local infection.  Skin prepped in a sterile fashion.  Local anesthesia: Topical Ethyl chloride.  With sterile technique and under real time ultrasound guidance: 1 cc Kenalog 40, 1 cc lidocaine, 1 cc bupivacaine injected easily Completed without difficulty  Pain immediately resolved suggesting accurate placement of the medication.  Advised to call if fevers/chills, erythema, induration, drainage, or persistent bleeding.  Images permanently stored and available for review in the ultrasound unit.  Impression: Technically successful ultrasound guided injection.  Independent interpretation of notes and tests performed by another provider:   None.  Brief History, Exam, Impression, and Recommendations:    Degenerative disc disease, cervical This is a pleasant 69 year old female, she had some neck pain radiating down her trapezius, right shoulder, down the arm to the elbow but not past. X-rays did show C6-C7 DDD. Symptoms were worse with prolonged downgaze, all consistent with a radicular source of discomfort. She failed conservative treatment, she is requesting an attempt at a shoulder injection before considering MRI for cervical spine intervention, I think this is appropriate. Adding some tramadol for pain relief, return to see me in 1 month, if no better we will get an MRI of her cervical spine.    ___________________________________________ Gwen Her. Dianah Field, M.D., ABFM., CAQSM. Primary Care and [place] Instructor of [place] of [facility] of Medicine

## 2020-01-07 NOTE — Assessment & Plan Note (Signed)
This is a pleasant 69 year old female, she had some neck pain radiating down her trapezius, right shoulder, down the arm to the elbow but not past. X-rays did show C6-C7 DDD. Symptoms were worse with prolonged downgaze, all consistent with a radicular source of discomfort. She failed conservative treatment, she is requesting an attempt at a shoulder injection before considering MRI for cervical spine intervention, I think this is appropriate. Adding some tramadol for pain relief, return to see me in 1 month, if no better we will get an MRI of her cervical spine.

## 2020-01-07 NOTE — Addendum Note (Signed)
Addended by: Silverio Decamp on: 01/07/2020 09:19 AM   Modules accepted: Orders

## 2020-01-19 ENCOUNTER — Other Ambulatory Visit: Payer: Self-pay | Admitting: Physician Assistant

## 2020-01-19 DIAGNOSIS — M25572 Pain in left ankle and joints of left foot: Secondary | ICD-10-CM

## 2020-01-19 DIAGNOSIS — G8929 Other chronic pain: Secondary | ICD-10-CM

## 2020-01-19 DIAGNOSIS — M25562 Pain in left knee: Secondary | ICD-10-CM

## 2020-02-03 ENCOUNTER — Ambulatory Visit: Payer: Medicare HMO | Admitting: Sports Medicine

## 2020-02-07 ENCOUNTER — Ambulatory Visit (INDEPENDENT_AMBULATORY_CARE_PROVIDER_SITE_OTHER): Payer: Medicare HMO | Admitting: Sports Medicine

## 2020-02-07 ENCOUNTER — Other Ambulatory Visit: Payer: Self-pay

## 2020-02-07 ENCOUNTER — Ambulatory Visit: Payer: Medicare HMO | Admitting: Physician Assistant

## 2020-02-07 DIAGNOSIS — L03032 Cellulitis of left toe: Secondary | ICD-10-CM

## 2020-02-07 DIAGNOSIS — M5030 Other cervical disc degeneration, unspecified cervical region: Secondary | ICD-10-CM | POA: Diagnosis not present

## 2020-02-07 MED ORDER — DOXYCYCLINE HYCLATE 100 MG PO TABS: 100.0000 mg | ORAL_TABLET | Freq: Two times a day (BID) | ORAL | 0 refills | Status: AC

## 2020-02-07 NOTE — Assessment & Plan Note (Signed)
Ann Griffith returns, she had some neck pain radiating into her right trapezius and shoulder. Down the arm to the elbow but not past. C6-C7 DDD on x-rays, at the last visit we had tried conservative treatment, we were planning to address the neck in an interventional fashion but we did a subacromial injection, she has had good resolution of her symptoms suggesting that there is both a subacromial and cervical component. Return as needed for this.

## 2020-02-07 NOTE — Progress Notes (Signed)
° ° °  Procedures performed today:    None.  Independent interpretation of notes and tests performed by another provider:   None.  Brief History, Exam, Impression, and Recommendations:    Degenerative disc disease, cervical Ann Griffith returns, she had some neck pain radiating into her right trapezius and shoulder. Down the arm to the elbow but not past. C6-C7 DDD on x-rays, at the last visit we had tried conservative treatment, we were planning to address the neck in an interventional fashion but we did a subacromial injection, she has had good resolution of her symptoms suggesting that there is both a subacromial and cervical component. Return as needed for this.  Paronychia of great toe, left This pleasant 69 year old female has also developed a paronychia of her left great toe, medial nail fold. She was able to express some purulence in the past, it does appear a bit swollen, minimally tender, no ingrown toenail. I am going to treat her with doxycycline, she will do warm compresses and try to express some of the purulence. She does have diabetes, it is well controlled, her last A1c was 6.4%, so I do not really think we need full triple coverage for now. If insufficient improvement after a week I will add coverage for Pseudomonas and anaerobes as well.     ___________________________________________ Gwen Her. Dianah Field, M.D., ABFM., CAQSM. Primary Care and [place] Instructor of [place] of [facility] of Medicine

## 2020-02-07 NOTE — Assessment & Plan Note (Signed)
This pleasant 69 year old female has also developed a paronychia of her left great toe, medial nail fold. She was able to express some purulence in the past, it does appear a bit swollen, minimally tender, no ingrown toenail. I am going to treat her with doxycycline, she will do warm compresses and try to express some of the purulence. She does have diabetes, it is well controlled, her last A1c was 6.4%, so I do not really think we need full triple coverage for now. If insufficient improvement after a week I will add coverage for Pseudomonas and anaerobes as well.

## 2020-02-07 NOTE — Patient Instructions (Signed)
Paronychia Paronychia is an infection of the skin that surrounds a nail. It usually affects the skin around a fingernail, but it may also occur near a toenail. It often causes pain and swelling around the nail. In some cases, a collection of pus (abscess) can form near or under the nail.  This condition may develop suddenly, or it may develop gradually over a longer period. In most cases, paronychia is not serious, and it will clear up with treatment. What are the causes? This condition may be caused by bacteria or a fungus. These germs can enter the body through an opening in the skin, such as a cut or a hangnail. What increases the risk? This condition is more likely to develop in people who:  Get their hands wet often, such as those who work as Designer, industrial/product, bartenders, or nurses.  Bite their fingernails or suck their thumbs.  Trim their nails very short.  Have hangnails or injured fingertips.  Get manicures.  Have diabetes. What are the signs or symptoms? Symptoms of this condition include:  Redness and swelling of the skin near the nail.  Tenderness around the nail when you touch the area.  Pus-filled bumps under the skin at the base and sides of the nail (cuticle).  Fluid or pus under the nail.  Throbbing pain in the area. How is this diagnosed? This condition is diagnosed with a physical exam. In some cases, a sample of pus may be tested to determine what type of bacteria or fungus is causing the condition. How is this treated? Treatment depends on the cause and severity of your condition. If your condition is mild, it may clear up on its own in a few days or after soaking in warm water. If needed, treatment may include:  Antibiotic medicine, if your infection is caused by bacteria.  Antifungal medicine, if your infection is caused by a fungus.  A procedure to drain pus from an abscess.  Anti-inflammatory medicine (corticosteroids). Follow these instructions at  home: Wound care  Keep the affected area clean.  Soak the affected area in warm water, if told to do so by your health care provider. You may be told to do this for 20 minutes, 2-3 times a day.  Keep the area dry when you are not soaking it.  Do not try to drain an abscess yourself.  Follow instructions from your health care provider about how to take care of the affected area. Make sure you: ? Wash your hands with soap and water before you change your bandage (dressing). If soap and water are not available, use hand sanitizer. ? Change your dressing as told by your health care provider.  If you had an abscess drained, check the area every day for signs of infection. Check for: ? Redness, swelling, or pain. ? Fluid or blood. ? Warmth. ? Pus or a bad smell. Medicines   Take over-the-counter and prescription medicines only as told by your health care provider.  If you were prescribed an antibiotic medicine, take it as told by your health care provider. Do not stop taking the antibiotic even if you start to feel better. General instructions  Avoid contact with harsh chemicals.  Do not pick at the affected area. Prevention  To prevent this condition from happening again: ? Wear rubber gloves when washing dishes or doing other tasks that require your hands to get wet. ? Wear gloves if your hands might come in contact with cleaners or other chemicals. ? Avoid  injuring your nails or fingertips. ? Do not bite your nails or tear hangnails. ? Do not cut your nails very short. ? Do not cut your cuticles. ? Use clean nail clippers or scissors when trimming nails. Contact a health care provider if:  Your symptoms get worse or do not improve with treatment.  You have continued or increased fluid, blood, or pus coming from the affected area.  Your finger or knuckle becomes swollen or difficult to move. Get help right away if you have:  A fever or chills.  Redness spreading away  from the affected area.  Joint or muscle pain. Summary  Paronychia is an infection of the skin that surrounds a nail. It often causes pain and swelling around the nail. In some cases, a collection of pus (abscess) can form near or under the nail.  This condition may be caused by bacteria or a fungus. These germs can enter the body through an opening in the skin, such as a cut or a hangnail.  If your condition is mild, it may clear up on its own in a few days. If needed, treatment may include medicine or a procedure to drain pus from an abscess.  To prevent this condition from happening again, wear gloves if doing tasks that require your hands to get wet or to come in contact with chemicals. Also avoid injuring your nails or fingertips. This information is not intended to replace advice given to you by your health care provider. Make sure you discuss any questions you have with your health care provider. Document Revised: 08/15/2017 Document Reviewed: 08/11/2017 Elsevier Patient Education  2020 [employer]  

## 2020-02-16 ENCOUNTER — Other Ambulatory Visit: Payer: Self-pay | Admitting: Physician Assistant

## 2020-02-26 ENCOUNTER — Other Ambulatory Visit: Payer: Self-pay | Admitting: Physician Assistant

## 2020-02-29 ENCOUNTER — Ambulatory Visit: Payer: Medicare HMO | Admitting: Physician Assistant

## 2020-03-01 ENCOUNTER — Ambulatory Visit: Payer: Medicare HMO | Admitting: Physician Assistant

## 2020-03-13 ENCOUNTER — Encounter: Payer: Self-pay | Admitting: Physician Assistant

## 2020-03-13 ENCOUNTER — Other Ambulatory Visit: Payer: Self-pay

## 2020-03-13 ENCOUNTER — Ambulatory Visit (INDEPENDENT_AMBULATORY_CARE_PROVIDER_SITE_OTHER): Payer: Medicare HMO | Admitting: Physician Assistant

## 2020-03-13 VITALS — BP 128/67 | HR 72 | Temp 98.4°F | Ht 66.5 in | Wt 170.0 lb

## 2020-03-13 DIAGNOSIS — M79605 Pain in left leg: Secondary | ICD-10-CM | POA: Diagnosis not present

## 2020-03-13 DIAGNOSIS — M791 Myalgia, unspecified site: Secondary | ICD-10-CM | POA: Diagnosis not present

## 2020-03-13 DIAGNOSIS — M79604 Pain in right leg: Secondary | ICD-10-CM | POA: Diagnosis not present

## 2020-03-13 DIAGNOSIS — L600 Ingrowing nail: Secondary | ICD-10-CM

## 2020-03-13 DIAGNOSIS — G2581 Restless legs syndrome: Secondary | ICD-10-CM | POA: Diagnosis not present

## 2020-03-13 DIAGNOSIS — E118 Type 2 diabetes mellitus with unspecified complications: Secondary | ICD-10-CM

## 2020-03-13 LAB — POCT GLYCOSYLATED HEMOGLOBIN (HGB A1C): Hemoglobin A1C: 6.6 % — AB (ref 4.0–5.6)

## 2020-03-13 MED ORDER — CEPHALEXIN 500 MG PO CAPS: 500.0000 mg | ORAL_CAPSULE | Freq: Three times a day (TID) | ORAL | 0 refills | Status: DC

## 2020-03-13 MED ORDER — ROPINIROLE HCL 1 MG PO TABS: 1.0000 mg | ORAL_TABLET | Freq: Three times a day (TID) | ORAL | 1 refills | Status: DC

## 2020-03-13 NOTE — Progress Notes (Addendum)
Subjective:    Patient ID: Ann Griffith, female    DOB: 02/09/1951, 69 y.o.   MRN: [id number]  HPI  Patient is a 69 year old female with type 2 diabetes, hypertension, hyperlipidemia, restless leg who presents to the clinic for medication follow-up.  Overall she has no concerns or complaints with her diabetes.  She is not checking her sugars.  She is trying to watch her sugars and carbs and stay active.  She denies any hypoglycemic events.  She denies any open sores or wounds.  She continues to have bilateral leg pain.  This pain is all throughout the day and feels very achy.  It does seem to be worse at night.  She does have restless leg syndrome and wonders if this is just her restless leg syndrome worsening.  She did have ABIs which were normal.  She tried gabapentin but it just makes her too dizzy.  She has tried cutting out and decreasing caffeine and artificial sweeteners which has not helped.  She is taking an oral iron supplement which has not seemed to help.  Requip gave her a lot of relief at one point but now is not helping.   Patient also mentions a painful lateral right great toe.  It is red and swollen and very tender.  Anti-inflammatories help a little.ongoing for many months seems to be worsening.   Travel coming up to high elevation. Wants preventative.   .. Active Ambulatory Problems    Diagnosis Date Noted  . Type 2 diabetes mellitus with complication, without long-term current use of insulin ([place]) 11/09/2007  . 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 CHOLELITHIASIS, HX OF 11/09/2007  . Diverticulosis 06/07/2013  . Raynaud's syndrome 08/09/2013  . BPPV (benign paroxysmal positional vertigo), right 11/02/2014  . Microalbuminuria 11/02/2014  . Hyperlipidemia LDL goal <70 11/03/2014  . Seborrheic dermatitis 03/14/2015  . Memory changes 05/05/2015  . Insomnia 08/08/2015  . Bilateral leg pain 08/08/2015  . Shoulder bursitis 08/08/2015  . Avitaminosis D 10/09/2012  . Colon  polyp 04/24/2011  . Tennis elbow syndrome 03/29/2016  . RLS (restless legs syndrome) 03/29/2016  . Right shoulder pain 03/29/2016  . 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 Cataract cortical, senile, left 06/04/2016  . Elevated serum creatinine 12/10/2016  . 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 Trigger finger, left ring finger 06/26/2017  . Chronic pain of both knees 06/26/2017  . Carpal tunnel syndrome, right 06/26/2017  . Dry skin dermatitis 06/26/2017  . Trochanteric bursitis 06/27/2017  . Fear of flying 09/28/2017  . Orthostatic hypotension 12/16/2017  . Syncope and collapse 12/16/2017  . Mitral regurgitation 12/19/2017  . Tricuspid regurgitation 12/19/2017  . Raynaud's phenomenon without gangrene 02/03/2018  . Concussion 12/08/2017  . Essential hypertension 12/10/2017  . Combined hyperlipidemia associated with type 2 diabetes mellitus ([place]) 04/24/2011  . Itching 04/27/2018  . Chronic pain of both ankles 04/27/2018  . Bilateral carpal tunnel syndrome 04/27/2018  . Statin intolerance 07/03/2018  . Leg cramping 09/03/2019  . Toe pain, right 12/01/2019  . Degenerative disc disease, cervical 12/07/2019  . Paronychia of great toe, left 02/07/2020  . Myalgia 03/14/2020  . Ingrown toenail of right foot 03/14/2020   Resolved Ambulatory Problems    Diagnosis Date Noted  . Eustachian tube dysfunction 05/02/2014  . Controlled type 2 diabetes mellitus without complication, without long-term current use of insulin ([place]) 06/26/2017  . Flatulence 09/28/2017  . Nausea 12/16/2017  . Hematoma and contusion 12/16/2017   Past Medical  History:  Diagnosis Date  . DIABETES MELLITUS, TYPE II 11/09/2007  . Hyperlipidemia        Review of Systems  All other systems reviewed and are negative.      Objective:   Physical Exam Vitals reviewed.  Constitutional:      Appearance: Normal appearance.  HENT:     Head: Normocephalic.   Cardiovascular:     Rate and Rhythm: Normal rate.     Pulses: Normal pulses.     Heart sounds: Murmur heard.   Pulmonary:     Effort: Pulmonary effort is normal.     Breath sounds: Normal breath sounds.  Musculoskeletal:     Right lower leg: No edema.     Left lower leg: No edema.  Skin:    Comments: Right lateral great toenail edge red, warm, swollen, painful to touch. No discharge.   Neurological:     General: No focal deficit present.     Mental Status: She is alert and oriented to person, place, and time.  Psychiatric:        Mood and Affect: Mood normal.        Behavior: Behavior normal.           Assessment & Plan:  [name] was seen today for diabetes.  Diagnoses and all orders for this visit:  Type 2 diabetes mellitus with complication, without long-term current use of insulin ([facility]) -     POCT glycosylated hemoglobin (Hb A1C)  RLS (restless legs syndrome) -     rOPINIRole (REQUIP) 1 MG tablet; Take 1 tablet (1 mg total) by mouth 3 (three) times daily.  Bilateral leg pain  Myalgia  Ingrown toenail of right foot -     cephALEXin (KEFLEX) 500 MG capsule; Take 1 capsule (500 mg total) by mouth 3 (three) times daily. -     Ambulatory referral to Podiatry   .[name] Results for orders placed or performed in visit on 03/13/20  POCT glycosylated hemoglobin (Hb A1C)  Result Value Ref Range   Hemoglobin A1C 6.6 (A) 4.0 - 5.6 %   HbA1c POC (<> result, manual entry)     HbA1c, POC (prediabetic range)     HbA1c, POC (controlled diabetic range)     A1C improving.  Stay on same medications.  On ace. BP controlled.  On statin.  Foot and eye exam UTD.  Vaccine UTD.  Follow up in 3 months.   With bilateral leg pain not improving some concern this could be statin side effect. ABI were normal. Start 2 week wash out of crestor and let me know how you are doing. If no improvement start back on crestor and start higher dose of requip for RLS symptoms. Continue iron for  anemia.   Appears like right ingrown toenail perhaps toward lateral cuticle. Red/warm and swollen today. Sent keflex and made referral downstairs to podiatry. Consider salt water soaks.   Sent diamox as preventative.

## 2020-03-13 NOTE — Patient Instructions (Addendum)
Dr. Darene Lamer knee pain consider injections steroid vs viscous.   Restless Legs Syndrome Restless legs syndrome is a condition that causes uncomfortable feelings or sensations in the legs, especially while sitting or lying down. The sensations usually cause an overwhelming urge to move the legs. The arms can also sometimes be affected. The condition can range from mild to severe. The symptoms often interfere with a person's ability to sleep. What are the causes? The cause of this condition is not known. What increases the risk? The following factors may make you more likely to develop this condition:  Being older than 50.  Pregnancy.  Being a woman. In general, the condition is more common in women than in men.  A family history of the condition.  Having iron deficiency.  Overuse of caffeine, nicotine, or alcohol.  Certain medical conditions, such as kidney disease, Parkinson's disease, or nerve damage.  Certain medicines, such as those for high blood pressure, nausea, colds, allergies, depression, and some heart conditions. What are the signs or symptoms? The main symptom of this condition is uncomfortable sensations in the legs, such as:  Pulling.  Tingling.  Prickling.  Throbbing.  Crawling.  Burning. Usually, the sensations:  Affect both sides of the body.  Are worse when you sit or lie down.  Are worse at night. These may wake you up or make it difficult to fall asleep.  Make you have a strong urge to move your legs.  Are temporarily relieved by moving your legs. The arms can also be affected, but this is rare. People who have this condition often have tiredness during the day because of their lack of sleep at night. How is this diagnosed? This condition may be diagnosed based on:  Your symptoms.  Blood tests. In some cases, you may be monitored in a sleep lab by a specialist (a sleep study). This can detect any disruptions in your sleep. How is this  treated? This condition is treated by managing the symptoms. This may include:  Lifestyle changes, such as exercising, using relaxation techniques, and avoiding caffeine, alcohol, or tobacco.  Medicines. Anti-seizure medicines may be tried first. Follow these instructions at home:     General instructions  Take over-the-counter and prescription medicines only as told by your health care provider.  Use methods to help relieve the uncomfortable sensations, such as: ? Massaging your legs. ? Walking or stretching. ? Taking a cold or hot bath.  Keep all follow-up visits as told by your health care provider. This is important. Lifestyle  Practice good sleep habits. For example, go to bed and get up at the same time every day. Most adults should get 7-9 hours of sleep each night.  Exercise regularly. Try to get at least 30 minutes of exercise most days of the week.  Practice ways of relaxing, such as yoga or meditation.  Avoid caffeine and alcohol.  Do not use any products that contain nicotine or tobacco, such as cigarettes and e-cigarettes. If you need help quitting, ask your health care provider. Contact a health care provider if:  Your symptoms get worse or they do not improve with treatment. Summary  Restless legs syndrome is a condition that causes uncomfortable feelings or sensations in the legs, especially while sitting or lying down.  The symptoms often interfere with a person's ability to sleep.  This condition is treated by managing the symptoms. You may need to make lifestyle changes or take medicines. This information is not intended to replace  advice given to you by your health care provider. Make sure you discuss any questions you have with your health care provider. Document Revised: 08/18/2017 Document Reviewed: 08/18/2017 Elsevier Patient Education  2020 [place] is an infection of the skin. It happens near a fingernail or toenail.  It may cause pain and swelling around the nail. In some cases, a fluid-filled bump (abscess) can form near or under the nail. Usually, this condition is not serious, and it clears up with treatment. Follow these instructions at home: Wound care  Keep the affected area clean.  Soak the fingers or toes in warm water as told by your doctor. You may be told to do this for 20 minutes, 2-3 times a day.  Keep the area dry when you are not soaking it.  Do not try to drain a fluid-filled bump on your own.  Follow instructions from your doctor about how to take care of the affected area. Make sure you: ? Wash your hands with soap and water before you change your bandage (dressing). If you cannot use soap and water, use hand sanitizer. ? Change your bandage as told by your doctor.  If you had a fluid-filled bump and your doctor drained it, check the area every day for signs of infection. Check for: ? Redness, swelling, or pain. ? Fluid or blood. ? Warmth. ? Pus or a bad smell. Medicines   Take over-the-counter and prescription medicines only as told by your doctor.  If you were prescribed an antibiotic medicine, take it as told by your doctor. Do not stop taking it even if you start to feel better. General instructions  Avoid touching any chemicals.  Do not pick at the affected area. Prevention  To prevent this condition from happening again: ? Wear rubber gloves when putting your hands in water for washing dishes or other tasks. ? Wear gloves if your hands might touch cleaners or chemicals. ? Avoid injuring your nails or fingertips. ? Do not bite your nails or tear hangnails. ? Do not cut your nails very short. ? Do not cut the skin at the base and sides of the nail (cuticles). ? Use clean nail clippers or scissors when trimming nails. Contact a doctor if:  You feel worse.  You do not get better.  You have more fluid, blood, or pus coming from the affected area.  Your finger  or knuckle is swollen or is hard to move. Get help right away if you have:  A fever or chills.  Redness spreading from the affected area.  Pain in a joint or muscle. Summary  Paronychia is an infection of the skin. It happens near a fingernail or toenail.  This condition may cause pain and swelling around the nail.  Soak the fingers or toes in warm water as told by your doctor.  Usually, this condition is not serious, and it clears up with treatment. This information is not intended to replace advice given to you by your health care provider. Make sure you discuss any questions you have with your health care provider. Document Revised: 08/15/2017 Document Reviewed: 08/11/2017 Elsevier Patient Education  2020 [employer].

## 2020-03-14 ENCOUNTER — Encounter: Payer: Self-pay | Admitting: Physician Assistant

## 2020-03-14 DIAGNOSIS — M791 Myalgia, unspecified site: Secondary | ICD-10-CM | POA: Insufficient documentation

## 2020-03-14 DIAGNOSIS — L600 Ingrowing nail: Secondary | ICD-10-CM | POA: Insufficient documentation

## 2020-03-15 MED ORDER — ACETAZOLAMIDE 125 MG PO TABS: 125.0000 mg | ORAL_TABLET | Freq: Two times a day (BID) | ORAL | 0 refills | Status: DC

## 2020-03-15 NOTE — Addendum Note (Signed)
Addended by: Donella Stade on: 03/15/2020 06:41 AM   Modules accepted: Orders

## 2020-03-17 ENCOUNTER — Ambulatory Visit: Payer: Medicare HMO | Admitting: Podiatry

## 2020-03-17 ENCOUNTER — Other Ambulatory Visit: Payer: Self-pay

## 2020-03-17 ENCOUNTER — Encounter: Payer: Self-pay | Admitting: Podiatry

## 2020-03-17 VITALS — BP 163/78 | HR 60 | Temp 97.6°F | Resp 16

## 2020-03-17 DIAGNOSIS — L600 Ingrowing nail: Secondary | ICD-10-CM

## 2020-03-17 DIAGNOSIS — M79674 Pain in right toe(s): Secondary | ICD-10-CM

## 2020-03-17 NOTE — Patient Instructions (Signed)

## 2020-03-17 NOTE — Progress Notes (Signed)
Subjective:   Patient ID: Ann Griffith, female   DOB: 69 y.o.   MRN: [id number]   HPI 69 year old female presents the office with concerns of ingrown toenail to the right big toe, lateral aspect which is been ongoing for 6 or more weeks.  She recently saw her primary care physician and she was given cephalexin which she just started yesterday.  She states that she is receiving some drainage that seems to resolve the area is still tender on the corner.  Denies any pus or red streaks.  She has no other concerns.   Review of Systems  All other systems reviewed and are negative.  Past Medical History:  Diagnosis Date   CHOLELITHIASIS, HX OF 11/09/2007   Qualifier: Diagnosis of  By: [facility], [place], TYPE II 11/09/2007   Qualifier: Diagnosis of  By: [name] Mcalpine     Diverticulosis    Hyperlipidemia     Past Surgical History:  Procedure Laterality Date   bone spur removed  2003   left heel   CATARACT EXTRACTION Left 02/2016   CHOLECYSTECTOMY     COLONOSCOPY  08/2003     Current Outpatient Medications:    acetaZOLAMIDE (DIAMOX) 125 MG tablet, Take 1 tablet (125 mg total) by mouth 2 (two) times daily. Start 2-3 days before altitude change and then 48 hours after back down., Disp: 60 tablet, Rfl: 0   AMBULATORY NON FORMULARY MEDICATION, Test strips and lancets.  Testing once a day.   Dx: 250.00, Disp: 100 strip, Rfl: 1   AMBULATORY NON FORMULARY MEDICATION, shingrx 2 doses to prevent shingles., Disp: 2 application, Rfl: 0   amLODipine (NORVASC) 2.5 MG tablet, TAKE 1-2 TABLETS (2.5-5 MG TOTAL) BY MOUTH DAILY., Disp: 180 tablet, Rfl: 0   Ascorbic Acid (VITA-C PO), Take by mouth., Disp: , Rfl:    cephALEXin (KEFLEX) 500 MG capsule, Take 1 capsule (500 mg total) by mouth 3 (three) times daily., Disp: 21 capsule, Rfl: 0   cholecalciferol (VITAMIN D) 1000 UNITS tablet, Take 1,000 Units by mouth 3 (three) times daily. , Disp: , Rfl:     Cyanocobalamin (VITAMIN B-12 PO), Take 5,000 mcg by mouth., Disp: , Rfl:    diclofenac Sodium (VOLTAREN) 1 % GEL, APPLY 4 GRAMS 4 TIMES DAILY TOPICALLY. TO AFFECTED JOINT., Disp: 100 g, Rfl: 9   ferrous sulfate 325 (65 FE) MG EC tablet, Take 1 tablet (325 mg total) by mouth daily with breakfast., Disp: 90 tablet, Rfl: 3   fish oil-omega-3 fatty acids 1000 MG capsule, Take 1 g by mouth daily., Disp: , Rfl:    lisinopril (ZESTRIL) 2.5 MG tablet, Take 1 tablet (2.5 mg total) by mouth daily., Disp: 90 tablet, Rfl: 3   MAGNESIUM PO, Take by mouth., Disp: , Rfl:    meloxicam (MOBIC) 15 MG tablet, Take by mouth., Disp: , Rfl:    metFORMIN (GLUCOPHAGE) 500 MG tablet, TAKE 2 TABLETS BY MOUTH EVERY DAY WITH BREAKFAST, Disp: 180 tablet, Rfl: 3   Misc. Devices MISC, One touch Ultra test strips 50/package  To test once a day for DM type II controlled., Disp: 50 each, Rfl: 1   rOPINIRole (REQUIP) 1 MG tablet, Take 1 tablet (1 mg total) by mouth 3 (three) times daily., Disp: 90 tablet, Rfl: 1   rosuvastatin (CRESTOR) 20 MG tablet, TAKE 1 TABLET BY MOUTH EVERY DAY, Disp: 90 tablet, Rfl: 1   traZODone (DESYREL) 50 MG tablet, TAKE 1 TABLET BY MOUTH EVERYDAY  AT BEDTIME, Disp: 90 tablet, Rfl: 3   TURMERIC PO, Take by mouth., Disp: , Rfl:   Allergies  Allergen Reactions   Amoxicillin Swelling   Gabapentin     dizzy   Atorvastatin Other (See Comments)    Memory loss         Objective:  Physical Exam  General: AAO x3, NAD  Dermatological: Incurvation to the right hallux toenail lateral aspect with localized edema and faint erythema more from inflammation as opposed to infection.  There is no drainage or pus or ascending cellulitis.  The nail is mildly hypertrophic, dystrophic with yellow discoloration.  Vascular: Dorsalis Pedis artery and Posterior Tibial artery pedal pulses are 2/4 bilateral with immedate capillary fill time. There is no pain with calf compression, swelling, warmth,  erythema.   Neruologic: Grossly intact via light touch bilateral.   Musculoskeletal: Tenderness to right lateral nail border.  Muscular strength 5/5 in all groups tested bilateral.  Gait: Unassisted, Nonantalgic.       Assessment:   Ingrown toenail right lateral nail border    Plan:  -Treatment options discussed including all alternatives, risks, and complications -Etiology of symptoms were discussed -At this time, the patient is requesting partial nail removal with chemical matricectomy to the symptomatic portion of the nail. Risks and complications were discussed with the patient for which they understand and written consent was obtained. Under sterile conditions a total of 3 mL of a mixture of 2% lidocaine plain and 0.5% Marcaine plain was infiltrated in a hallux block fashion. Once anesthetized, the skin was prepped in sterile fashion. A tourniquet was then applied. Next the lateral aspect of hallux nail border was then sharply excised making sure to remove the entire offending nail border. Once the nails were ensured to be removed area was debrided and the underlying skin was intact. There is no purulence identified in the procedure. Next phenol was then applied under standard conditions and copiously irrigated. Silvadene was applied. A dry sterile dressing was applied. After application of the dressing the tourniquet was removed and there is found to be an immediate capillary refill time to the digit. The patient tolerated the procedure well any complications. Post procedure instructions were discussed the patient for which he verbally understood. Follow-up in one week for nail check or sooner if any problems are to arise. Discussed signs/symptoms of infection and directed to call the office immediately should any occur or go directly to the emergency room. In the meantime, encouraged to call the office with any questions, concerns, changes symptoms. -Finish course of cephalexin  Return in  about 1 week (around 03/24/2020).  Trula Slade DPM

## 2020-03-24 ENCOUNTER — Other Ambulatory Visit: Payer: Self-pay

## 2020-03-24 ENCOUNTER — Ambulatory Visit: Payer: Medicare HMO | Admitting: Podiatry

## 2020-03-24 ENCOUNTER — Encounter: Payer: Self-pay | Admitting: Podiatry

## 2020-03-24 VITALS — Temp 98.0°F

## 2020-03-24 DIAGNOSIS — L600 Ingrowing nail: Secondary | ICD-10-CM

## 2020-03-24 NOTE — Patient Instructions (Signed)

## 2020-03-25 ENCOUNTER — Other Ambulatory Visit: Payer: Self-pay | Admitting: Physician Assistant

## 2020-03-26 NOTE — Progress Notes (Signed)
Subjective: Ann Griffith is a 69 y.o.  female returns to office today for follow up evaluation after having right hallux lateral partial nail avulsion performed. Patient has been soaking using Epson salts and applying topical antibiotic covered with bandaid daily.  States that she is doing well she is not having any pain.  Patient denies fevers, chills, nausea, vomiting. Denies any calf pain, chest pain, SOB.   Objective:  General: Well developed, nourished, in no acute distress, alert and oriented x3   Dermatology: Skin is warm, dry and supple bilateral.  Right hallux nail border appears to be clean, dry, with mild granular tissue and surrounding scab. There is no surrounding erythema, edema, drainage/purulence. The remaining nails appear unremarkable at this time. There are no other lesions or other signs of infection present.  Neurovascular status: Intact. No lower extremity swelling; No pain with calf compression bilateral.  Musculoskeletal: No tenderness to palpation of the right hallux nail fold. Muscular strength within normal limits bilateral.   Assesement and Plan: S/p partial nail avulsion, doing well.   -Continue soaking in epsom salts twice a day followed by antibiotic ointment and a band-aid. Can leave uncovered at night. Continue this until completely healed.  -If the area has not healed in 2 weeks, call the office for follow-up appointment, or sooner if any problems arise.  -Monitor for any signs/symptoms of infection. Call the office immediately if any occur or go directly to the emergency room. Call with any questions/concerns.  Celesta Gentile, DPM

## 2020-03-27 ENCOUNTER — Encounter: Payer: Self-pay | Admitting: Obstetrics & Gynecology

## 2020-03-27 ENCOUNTER — Ambulatory Visit: Payer: Medicare HMO | Admitting: Obstetrics & Gynecology

## 2020-03-27 ENCOUNTER — Other Ambulatory Visit: Payer: Self-pay

## 2020-03-27 ENCOUNTER — Telehealth: Payer: Self-pay | Admitting: Obstetrics & Gynecology

## 2020-03-27 VITALS — BP 124/80 | HR 70 | Resp 16 | Wt 170.0 lb

## 2020-03-27 DIAGNOSIS — N9089 Other specified noninflammatory disorders of vulva and perineum: Secondary | ICD-10-CM

## 2020-03-27 MED ORDER — FLUCONAZOLE 150 MG PO TABS
ORAL_TABLET | ORAL | 0 refills | Status: DC
Start: 2020-03-27 — End: 2020-06-13

## 2020-03-27 NOTE — Progress Notes (Signed)
GYNECOLOGY  VISIT  CC:   Vaginal itching  HPI: 69 y.o. G1P1 Married White or Caucasian female here for vaginal itching that started about a week ago.  Pt is on antibiotics that were started on 03/17/2020 when she had an ingrown toenail removed.  Denies vaginal bleeding.  Denies discharge.  When she voids, she is noticing some irritation on the skin.    GYNECOLOGIC HISTORY: Patient's last menstrual period was 08/12/2000. Contraception: post menopausal Menopausal hormone therapy: none  Patient Active Problem List   Diagnosis Date Noted  . Myalgia 03/14/2020  . Ingrown toenail of right foot 03/14/2020  . Paronychia of great toe, left 02/07/2020  . Degenerative disc disease, cervical 12/07/2019  . Toe pain, right 12/01/2019  . Leg cramping 09/03/2019  . Statin intolerance 07/03/2018  . Itching 04/27/2018  . Chronic pain of both ankles 04/27/2018  . Bilateral carpal tunnel syndrome 04/27/2018  . Raynaud's phenomenon without gangrene 02/03/2018  . Mitral regurgitation 12/19/2017  . Tricuspid regurgitation 12/19/2017  . Orthostatic hypotension 12/16/2017  . Syncope and collapse 12/16/2017  . Essential hypertension 12/10/2017  . Concussion 12/08/2017  . Fear of flying 09/28/2017  . Trochanteric bursitis 06/27/2017  . Trigger finger, left ring finger 06/26/2017  . Chronic pain of both knees 06/26/2017  . Carpal tunnel syndrome, right 06/26/2017  . Dry skin dermatitis 06/26/2017  . 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 Elevated serum creatinine 12/10/2016  . Cataract cortical, senile, left 06/04/2016  . 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 Tennis elbow syndrome 03/29/2016  . RLS (restless legs syndrome) 03/29/2016  . Right shoulder pain 03/29/2016  . Insomnia 08/08/2015  . Bilateral leg pain 08/08/2015  . Shoulder bursitis 08/08/2015  . Memory changes 05/05/2015  . Seborrheic dermatitis 03/14/2015  . Hyperlipidemia LDL goal <70  11/03/2014  . BPPV (benign paroxysmal positional vertigo), right 11/02/2014  . Microalbuminuria 11/02/2014  . Raynaud's syndrome 08/09/2013  . Diverticulosis 06/07/2013  . Avitaminosis D 10/09/2012  . Colon polyp 04/24/2011  . Combined hyperlipidemia associated with type 2 diabetes mellitus ([place]) 04/24/2011  . Type 2 diabetes mellitus with complication, without long-term current use of insulin ([place]) 11/09/2007  . 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 CHOLELITHIASIS, HX OF 11/09/2007    Past Medical History:  Diagnosis Date  . CHOLELITHIASIS, HX OF 11/09/2007   Qualifier: Diagnosis of  By: [name] Mcalpine    . DIABETES MELLITUS, TYPE II 11/09/2007   Qualifier: Diagnosis of  By: [name] Mcalpine    . Diverticulosis   . Hyperlipidemia     Past Surgical History:  Procedure Laterality Date  . bone spur removed  2003   left heel  . CATARACT EXTRACTION Left 02/2016  . CHOLECYSTECTOMY    . COLONOSCOPY  08/2003    MEDS:   Current Outpatient Medications on File Prior to Visit  Medication Sig Dispense Refill  . amLODipine (NORVASC) 2.5 MG tablet TAKE 1-2 TABLETS (2.5-5 MG TOTAL) BY MOUTH DAILY. 180 tablet 0  . Ascorbic Acid (VITA-C PO) Take by mouth.    . cephALEXin (KEFLEX) 500 MG capsule Take 1 capsule (500 mg total) by mouth 3 (three) times daily. 21 capsule 0  . Cyanocobalamin (VITAMIN B-12 PO) Take 5,000 mcg by mouth every other day.     . diclofenac Sodium (VOLTAREN) 1 % GEL APPLY 4 GRAMS 4 TIMES DAILY TOPICALLY. TO AFFECTED JOINT. 100 g 9  . ferrous sulfate 325 (65 FE) MG EC tablet Take 1 tablet (325 mg  total) by mouth daily with breakfast. 90 tablet 3  . fish oil-omega-3 fatty acids 1000 MG capsule Take 1 g by mouth daily.    [name] lisinopril (ZESTRIL) 2.5 MG tablet Take 1 tablet (2.5 mg total) by mouth daily. 90 tablet 3  . MAGNESIUM PO Take by mouth.    . metFORMIN (GLUCOPHAGE) 500 MG tablet TAKE 2 TABLETS BY MOUTH EVERY DAY WITH BREAKFAST 180 tablet 3  . rOPINIRole (REQUIP) 1 MG  tablet Take 1 tablet (1 mg total) by mouth 3 (three) times daily. 90 tablet 1  . traZODone (DESYREL) 50 MG tablet TAKE 1 TABLET BY MOUTH EVERYDAY AT BEDTIME 90 tablet 3  . TURMERIC PO Take by mouth.    [name] VITAMIN D PO Take 5,000 Int'l Units by mouth.    . rosuvastatin (CRESTOR) 20 MG tablet TAKE 1 TABLET BY MOUTH EVERY DAY (Patient not taking: Reported on 03/27/2020) 90 tablet 1   No current facility-administered medications on file prior to visit.    ALLERGIES: Amoxicillin, Gabapentin, and Atorvastatin  Family History  Problem Relation Age of Onset  . Diabetes Mother     SH:  Married, non smoker  Review of Systems  Constitutional: Negative.   HENT: Negative.   Eyes: Negative.   Respiratory: Negative.   Cardiovascular: Negative.   Gastrointestinal: Negative.   Endocrine: Negative.   Genitourinary:       Vaginal itching  Musculoskeletal: Negative.   Skin: Negative.   Allergic/Immunologic: Negative.   Neurological: Negative.   Hematological: Negative.   Psychiatric/Behavioral: Negative.     PHYSICAL EXAMINATION:    Wt 170 lb (77.1 kg)   LMP 08/12/2000   BMI 27.03 kg/m     General appearance: alert, cooperative and appears stated age Lymph:  no inguinal LAD noted  Pelvic: External genitalia:  Inner labia majora erythema              Urethra:  normal appearing urethra with no masses, tenderness or lesions              Bartholins and Skenes: normal                 Vagina: normal appearing vagina with normal color and discharge, no lesions              Cervix: no lesions              Bimanual Exam:  Uterus:  normal size, contour, position, consistency, mobility, non-tender              Adnexa: no mass, fullness, tenderness  Chaperone, Terence Lux, CMA, was present for exam.  Assessment: Vulvar irritation Upcoming trip   Plan: Vaginitis obtained today Rx for diflucan 150mg  po x 1, repeat 72 hours sent to pharmacy. Will try to follow up with pt prior to her  leaving for trip

## 2020-03-27 NOTE — Telephone Encounter (Signed)
Spoke with patient. Patient reports external  vaginal itching that has been present for approximately 1 wk. Not resolved with OTC vagisil. Denies any other GYN symptoms, request OV.   Last AEX 04/14/18  OV scheduled for today at 4:30pm with Dr. [name] Heck. Patient is agreeable to date and time.   Encounter closed.

## 2020-03-27 NOTE — Telephone Encounter (Signed)
Patient would like to be seen for vaginal itching.

## 2020-03-28 ENCOUNTER — Other Ambulatory Visit: Payer: Self-pay | Admitting: Physician Assistant

## 2020-03-28 DIAGNOSIS — G8929 Other chronic pain: Secondary | ICD-10-CM

## 2020-03-28 DIAGNOSIS — M25562 Pain in left knee: Secondary | ICD-10-CM

## 2020-03-28 DIAGNOSIS — M25572 Pain in left ankle and joints of left foot: Secondary | ICD-10-CM

## 2020-03-29 ENCOUNTER — Other Ambulatory Visit: Payer: Self-pay | Admitting: Sports Medicine

## 2020-03-29 DIAGNOSIS — M5030 Other cervical disc degeneration, unspecified cervical region: Secondary | ICD-10-CM

## 2020-03-30 ENCOUNTER — Telehealth: Payer: Self-pay

## 2020-03-30 LAB — NUSWAB BV AND CANDIDA, NAA
Candida albicans, NAA: NEGATIVE
Candida glabrata, NAA: NEGATIVE

## 2020-03-30 NOTE — Telephone Encounter (Signed)
-----   Message from Megan Salon, MD sent at 03/30/2020 12:47 PM EDT ----- Regarding: RE: nuswab Can you call pt and let her know this test will not be back until tomorrow and I am very sorry.  I know she is going on her trip.  Can you get an update from her in case I need to make a change with her prescription?  Thanks.  Vinnie Level ----- Message ----- From: Susy Manor, CMA Sent: 03/30/2020  12:04 PM EDT To: Megan Salon, MD Subject: RE: Danne Baxter                                     Per Lorre Nick, she called & was told that it should be resulted by tomorrow ----- Message ----- From: Megan Salon, MD Sent: 03/30/2020   7:22 AM EDT To: Susy Manor, CMA Subject: nuswab                                         Can you check with Lorre Nick about when this might be back?  This was the swab at the end of the day from Monday?  Pt is going on trip I think tomorrow so would be nice to touch base with her prior to leaving.  Thanks.  Vinnie Level

## 2020-03-30 NOTE — Telephone Encounter (Signed)
Left message for call back.

## 2020-03-31 NOTE — Telephone Encounter (Signed)
Patient notified of results. See lab 

## 2020-04-03 ENCOUNTER — Other Ambulatory Visit: Payer: Self-pay | Admitting: Physician Assistant

## 2020-04-03 DIAGNOSIS — G2581 Restless legs syndrome: Secondary | ICD-10-CM

## 2020-04-10 ENCOUNTER — Encounter: Payer: Self-pay | Admitting: Physician Assistant

## 2020-04-11 MED ORDER — PITAVASTATIN CALCIUM 2 MG PO TABS: 1.0000 | ORAL_TABLET | Freq: Every day | ORAL | 5 refills | Status: DC

## 2020-04-11 NOTE — Telephone Encounter (Signed)
Side effects to both lipitor and crestor. Will try livalo.

## 2020-04-13 ENCOUNTER — Other Ambulatory Visit: Payer: Self-pay | Admitting: Physician Assistant

## 2020-04-13 DIAGNOSIS — Z1231 Encounter for screening mammogram for malignant neoplasm of breast: Secondary | ICD-10-CM | POA: Diagnosis not present

## 2020-04-13 LAB — HM MAMMOGRAPHY

## 2020-04-14 NOTE — Telephone Encounter (Signed)
Can we get PA for medication?

## 2020-06-10 ENCOUNTER — Other Ambulatory Visit: Payer: Self-pay | Admitting: Physician Assistant

## 2020-06-10 DIAGNOSIS — G2581 Restless legs syndrome: Secondary | ICD-10-CM

## 2020-06-13 ENCOUNTER — Ambulatory Visit (INDEPENDENT_AMBULATORY_CARE_PROVIDER_SITE_OTHER): Payer: Medicare HMO | Admitting: Physician Assistant

## 2020-06-13 ENCOUNTER — Encounter: Payer: Self-pay | Admitting: Physician Assistant

## 2020-06-13 VITALS — BP 138/73 | HR 68 | Ht 66.5 in | Wt 168.0 lb

## 2020-06-13 DIAGNOSIS — G2581 Restless legs syndrome: Secondary | ICD-10-CM | POA: Diagnosis not present

## 2020-06-13 DIAGNOSIS — I7300 Raynaud's syndrome without gangrene: Secondary | ICD-10-CM

## 2020-06-13 DIAGNOSIS — I10 Essential (primary) hypertension: Secondary | ICD-10-CM | POA: Diagnosis not present

## 2020-06-13 DIAGNOSIS — Z789 Other specified health status: Secondary | ICD-10-CM | POA: Diagnosis not present

## 2020-06-13 DIAGNOSIS — E118 Type 2 diabetes mellitus with unspecified complications: Secondary | ICD-10-CM

## 2020-06-13 DIAGNOSIS — E785 Hyperlipidemia, unspecified: Secondary | ICD-10-CM

## 2020-06-13 DIAGNOSIS — D509 Iron deficiency anemia, unspecified: Secondary | ICD-10-CM

## 2020-06-13 DIAGNOSIS — Z23 Encounter for immunization: Secondary | ICD-10-CM

## 2020-06-13 LAB — POCT GLYCOSYLATED HEMOGLOBIN (HGB A1C): Hemoglobin A1C: 6.4 % — AB (ref 4.0–5.6)

## 2020-06-13 MED ORDER — AMLODIPINE BESYLATE 2.5 MG PO TABS: 2.5000 mg | ORAL_TABLET | Freq: Every day | ORAL | 0 refills | Status: DC

## 2020-06-13 NOTE — Patient Instructions (Signed)

## 2020-06-13 NOTE — Progress Notes (Signed)
Subjective:    Patient ID: Ann Griffith, female    DOB: 02-21-1951, 68 y.o.   MRN: [id number]  HPI  Pt is a 69 yo female with T2DM, HTN, HLD, RLS who presents to the clinic for 3 month follow up.   Pt is doing well. She is on metformin 1000mg  daily. She is not checking her sugars. No hypoglycemic events. No open sores or wounds.   She has not tolerated oral iron very well. It makes her nauseated and constipated.   She denies any CP, palpitations, headaches or vision changes.   .. Active Ambulatory Problems    Diagnosis Date Noted   Type 2 diabetes mellitus with complication, without long-term current use of insulin ([place]) 11/09/2007   SCHATZKI'S RING 11/09/2007   CHOLELITHIASIS, HX OF 11/09/2007   Diverticulosis 06/07/2013   Raynaud's syndrome 08/09/2013   BPPV (benign paroxysmal positional vertigo), right 11/02/2014   Microalbuminuria 11/02/2014   Hyperlipidemia LDL goal <70 11/03/2014   Seborrheic dermatitis 03/14/2015   Memory changes 05/05/2015   Insomnia 08/08/2015   Bilateral leg pain 08/08/2015   Shoulder bursitis 08/08/2015   Avitaminosis D 10/09/2012   Colon polyp 04/24/2011   Tennis elbow syndrome 03/29/2016   RLS (restless legs syndrome) 03/29/2016   Right shoulder pain 03/29/2016   Degenerative joint disease of right shoulder 04/01/2016   Cataract cortical, senile, left 06/04/2016   Elevated serum creatinine 12/10/2016   Tricompartment osteoarthritis of knees, bilateral 06/25/2017   Trigger finger, left ring finger 06/26/2017   Chronic pain of both knees 06/26/2017   Carpal tunnel syndrome, right 06/26/2017   Dry skin dermatitis 06/26/2017   Trochanteric bursitis 06/27/2017   Fear of flying 09/28/2017   Orthostatic hypotension 12/16/2017   Syncope and collapse 12/16/2017   Mitral regurgitation 12/19/2017   Tricuspid regurgitation 12/19/2017   Raynaud's phenomenon without gangrene 02/03/2018   Concussion 12/08/2017    Essential hypertension 12/10/2017   Combined hyperlipidemia associated with type 2 diabetes mellitus ([place]) 04/24/2011   Itching 04/27/2018   Chronic pain of both ankles 04/27/2018   Bilateral carpal tunnel syndrome 04/27/2018   Statin intolerance 07/03/2018   Leg cramping 09/03/2019   Toe pain, right 12/01/2019   Degenerative disc disease, cervical 12/07/2019   Paronychia of great toe, left 02/07/2020   Myalgia 03/14/2020   Ingrown toenail of right foot 03/14/2020   Iron deficiency anemia 06/13/2020   Resolved Ambulatory Problems    Diagnosis Date Noted   Eustachian tube dysfunction 05/02/2014   Controlled type 2 diabetes mellitus without complication, without long-term current use of insulin ([place]) 06/26/2017   Flatulence 09/28/2017   Nausea 12/16/2017   Hematoma and contusion 12/16/2017   Past Medical History:  Diagnosis Date   DIABETES MELLITUS, TYPE II 11/09/2007   Hyperlipidemia       Review of Systems  All other systems reviewed and are negative.      Objective:   Physical Exam Vitals reviewed.  Constitutional:      Appearance: Normal appearance.  HENT:     Head: Normocephalic.  Cardiovascular:     Rate and Rhythm: Normal rate and regular rhythm.     Pulses: Normal pulses.     Heart sounds: Murmur heard.   Pulmonary:     Effort: Pulmonary effort is normal.     Breath sounds: Normal breath sounds.  Musculoskeletal:     Right lower leg: No edema.     Left lower leg: No edema.  Neurological:     General: No focal  deficit present.     Mental Status: She is alert and oriented to person, place, and time.  Psychiatric:        Mood and Affect: Mood normal.           Assessment & Plan:  [name] was seen today for diabetes.  Diagnoses and all orders for this visit:  Type 2 diabetes mellitus with complication, without long-term current use of insulin ([facility]) -     POCT glycosylated hemoglobin (Hb A1C)  Flu vaccine need -     Flu  Vaccine QUAD High Dose(Fluad)  RLS (restless legs syndrome)  Raynaud's phenomenon without gangrene -     amLODipine (NORVASC) 2.5 MG tablet; Take 1-2 tablets (2.5-5 mg total) by mouth daily.  Essential hypertension -     amLODipine (NORVASC) 2.5 MG tablet; Take 1-2 tablets (2.5-5 mg total) by mouth daily.  Hyperlipidemia LDL goal <70  Statin intolerance  Iron deficiency anemia, unspecified iron deficiency anemia type    Lab Results  Component Value Date   HGBA1C 6.4 (A) 06/13/2020   A1c to goal. Continue metformin. Discussed no need for continuous glucose monitoring system. Taking statin every other day to see if she can tolerate better.  LDL due in January at complete physical. On ACE.  Blood pressure very close to goal of 130/90. Foot exam up-to-date Eye exam up-to-date Flu, Covid, shingles, pneumonia vaccines up-to-date Follow-up in 3 months  RLS- continue on requip.   Ok to stop iron and go to iron rich foods and recheck CBC in January.

## 2020-06-21 ENCOUNTER — Other Ambulatory Visit: Payer: Self-pay | Admitting: Physician Assistant

## 2020-06-21 DIAGNOSIS — G2581 Restless legs syndrome: Secondary | ICD-10-CM

## 2020-06-27 ENCOUNTER — Other Ambulatory Visit: Payer: Self-pay

## 2020-06-27 ENCOUNTER — Ambulatory Visit (INDEPENDENT_AMBULATORY_CARE_PROVIDER_SITE_OTHER): Payer: Medicare HMO | Admitting: Physician Assistant

## 2020-06-27 VITALS — BP 138/63 | HR 75 | Ht 66.5 in | Wt 170.0 lb

## 2020-06-27 DIAGNOSIS — M5442 Lumbago with sciatica, left side: Secondary | ICD-10-CM

## 2020-06-27 DIAGNOSIS — M545 Low back pain, unspecified: Secondary | ICD-10-CM | POA: Diagnosis not present

## 2020-06-27 DIAGNOSIS — M47816 Spondylosis without myelopathy or radiculopathy, lumbar region: Secondary | ICD-10-CM

## 2020-06-27 MED ORDER — KETOROLAC TROMETHAMINE 60 MG/2ML IM SOLN
60.0000 mg | Freq: Once | INTRAMUSCULAR | Status: AC
  Administered 2020-06-27: 60 mg via INTRAMUSCULAR

## 2020-06-27 MED ORDER — HYDROCODONE-ACETAMINOPHEN 5-325 MG PO TABS: 1.0000 | ORAL_TABLET | Freq: Three times a day (TID) | ORAL | 0 refills | Status: AC | PRN

## 2020-06-27 MED ORDER — PREDNISONE 50 MG PO TABS: ORAL_TABLET | ORAL | 0 refills | Status: DC

## 2020-06-27 NOTE — Patient Instructions (Signed)
Omeprazole with your prednisone for 5 days.  Ok to take diclofenac if needed twice a day after finishing prednisone.  norco as needed for moderate to severe pain.    Low Back Sprain or Strain Rehab Ask your health care provider which exercises are safe for you. Do exercises exactly as told by your health care provider and adjust them as directed. It is normal to feel mild stretching, pulling, tightness, or discomfort as you do these exercises. Stop right away if you feel sudden pain or your pain gets worse. Do not begin these exercises until told by your health care provider. Stretching and range-of-motion exercises These exercises warm up your muscles and joints and improve the movement and flexibility of your back. These exercises also help to relieve pain, numbness, and tingling. Lumbar rotation  1. Lie on your back on a firm surface and bend your knees. 2. Straighten your arms out to your sides so each arm forms a 90-degree angle (right angle) with a side of your body. 3. Slowly move (rotate) both of your knees to one side of your body until you feel a stretch in your lower back (lumbar). Try not to let your shoulders lift off the floor. 4. Hold this position for __________ seconds. 5. Tense your abdominal muscles and slowly move your knees back to the starting position. 6. Repeat this exercise on the other side of your body. Repeat __________ times. Complete this exercise __________ times a day. Single knee to chest  1. Lie on your back on a firm surface with both legs straight. 2. Bend one of your knees. Use your hands to move your knee up toward your chest until you feel a gentle stretch in your lower back and buttock. ? Hold your leg in this position by holding on to the front of your knee. ? Keep your other leg as straight as possible. 3. Hold this position for __________ seconds. 4. Slowly return to the starting position. 5. Repeat with your other leg. Repeat __________ times.  Complete this exercise __________ times a day. Prone extension on elbows  1. Lie on your abdomen on a firm surface (prone position). 2. Prop yourself up on your elbows. 3. Use your arms to help lift your chest up until you feel a gentle stretch in your abdomen and your lower back. ? This will place some of your body weight on your elbows. If this is uncomfortable, try stacking pillows under your chest. ? Your hips should stay down, against the surface that you are lying on. Keep your hip and back muscles relaxed. 4. Hold this position for __________ seconds. 5. Slowly relax your upper body and return to the starting position. Repeat __________ times. Complete this exercise __________ times a day. Strengthening exercises These exercises build strength and endurance in your back. Endurance is the ability to use your muscles for a long time, even after they get tired. Pelvic tilt This exercise strengthens the muscles that lie deep in the abdomen. 1. Lie on your back on a firm surface. Bend your knees and keep your feet flat on the floor. 2. Tense your abdominal muscles. Tip your pelvis up toward the ceiling and flatten your lower back into the floor. ? To help with this exercise, you may place a small towel under your lower back and try to push your back into the towel. 3. Hold this position for __________ seconds. 4. Let your muscles relax completely before you repeat this exercise. Repeat __________ times.  Complete this exercise __________ times a day. Alternating arm and leg raises  1. Get on your hands and knees on a firm surface. If you are on a hard floor, you may want to use padding, such as an exercise mat, to cushion your knees. 2. Line up your arms and legs. Your hands should be directly below your shoulders, and your knees should be directly below your hips. 3. Lift your left leg behind you. At the same time, raise your right arm and straighten it in front of you. ? Do not lift your  leg higher than your hip. ? Do not lift your arm higher than your shoulder. ? Keep your abdominal and back muscles tight. ? Keep your hips facing the ground. ? Do not arch your back. ? Keep your balance carefully, and do not hold your breath. 4. Hold this position for __________ seconds. 5. Slowly return to the starting position. 6. Repeat with your right leg and your left arm. Repeat __________ times. Complete this exercise __________ times a day. Abdominal set with straight leg raise  1. Lie on your back on a firm surface. 2. Bend one of your knees and keep your other leg straight. 3. Tense your abdominal muscles and lift your straight leg up, 4-6 inches (10-15 cm) off the ground. 4. Keep your abdominal muscles tight and hold this position for __________ seconds. ? Do not hold your breath. ? Do not arch your back. Keep it flat against the ground. 5. Keep your abdominal muscles tense as you slowly lower your leg back to the starting position. 6. Repeat with your other leg. Repeat __________ times. Complete this exercise __________ times a day. Single leg lower with bent knees 1. Lie on your back on a firm surface. 2. Tense your abdominal muscles and lift your feet off the floor, one foot at a time, so your knees and hips are bent in 90-degree angles (right angles). ? Your knees should be over your hips and your lower legs should be parallel to the floor. 3. Keeping your abdominal muscles tense and your knee bent, slowly lower one of your legs so your toe touches the ground. 4. Lift your leg back up to return to the starting position. ? Do not hold your breath. ? Do not let your back arch. Keep your back flat against the ground. 5. Repeat with your other leg. Repeat __________ times. Complete this exercise __________ times a day. Posture and body mechanics Good posture and healthy body mechanics can help to relieve stress in your body's tissues and joints. Body mechanics refers to the  movements and positions of your body while you do your daily activities. Posture is part of body mechanics. Good posture means:  Your spine is in its natural S-curve position (neutral).  Your shoulders are pulled back slightly.  Your head is not tipped forward. Follow these guidelines to improve your posture and body mechanics in your everyday activities. Standing   When standing, keep your spine neutral and your feet about hip width apart. Keep a slight bend in your knees. Your ears, shoulders, and hips should line up.  When you do a task in which you stand in one place for a long time, place one foot up on a stable object that is 2-4 inches (5-10 cm) high, such as a footstool. This helps keep your spine neutral. Sitting   When sitting, keep your spine neutral and keep your feet flat on the floor. Use a footrest,  if necessary, and keep your thighs parallel to the floor. Avoid rounding your shoulders, and avoid tilting your head forward.  When working at a desk or a computer, keep your desk at a height where your hands are slightly lower than your elbows. Slide your chair under your desk so you are close enough to maintain good posture.  When working at a computer, place your monitor at a height where you are looking straight ahead and you do not have to tilt your head forward or downward to look at the screen. Resting  When lying down and resting, avoid positions that are most painful for you.  If you have pain with activities such as sitting, bending, stooping, or squatting, lie in a position in which your body does not bend very much. For example, avoid curling up on your side with your arms and knees near your chest (fetal position).  If you have pain with activities such as standing for a long time or reaching with your arms, lie with your spine in a neutral position and bend your knees slightly. Try the following positions: ? Lying on your side with a pillow between your  knees. ? Lying on your back with a pillow under your knees. Lifting   When lifting objects, keep your feet at least shoulder width apart and tighten your abdominal muscles.  Bend your knees and hips and keep your spine neutral. It is important to lift using the strength of your legs, not your back. Do not lock your knees straight out.  Always ask for help to lift heavy or awkward objects. This information is not intended to replace advice given to you by your health care provider. Make sure you discuss any questions you have with your health care provider. Document Revised: 11/20/2018 Document Reviewed: 08/20/2018 Elsevier Patient Education  [place].

## 2020-06-27 NOTE — Progress Notes (Signed)
History of pinched nerve in low back Started hurting yesterday No injury Pain left lower back down left side of buttock Has used heat/ice/ibuprofen/ old flexeril  Better today than yesterday, but going on road trip and worried

## 2020-06-27 NOTE — Progress Notes (Signed)
Subjective:    Patient ID: Ann Griffith, female    DOB: 08/24/1950, 69 y.o.   MRN: [id number]  HPI  Patient is a 69 year old female with history of lumbar degenerative disc disease and facet arthritis who presents to the clinic with some left lower back pain. Last xray was 03/2019. Pain does not radiate into buttocks or legs. No leg weakness, saddle anesthesia, or bowel/bladder dysfunction. Pain started yesterday with no trauma. She was walking through the house and felt it come on suddenly. Yesterday pain was 9/10. Pain worse with extension and better with flexion. Took ibuprofen, flexeril, heat/ice and better today 6/10. She is supposed to go on a trip this weekend.   .. Active Ambulatory Problems    Diagnosis Date Noted   Type 2 diabetes mellitus with complication, without long-term current use of insulin ([place]) 11/09/2007   SCHATZKI'S RING 11/09/2007   CHOLELITHIASIS, HX OF 11/09/2007   Diverticulosis 06/07/2013   Raynaud's syndrome 08/09/2013   BPPV (benign paroxysmal positional vertigo), right 11/02/2014   Microalbuminuria 11/02/2014   Hyperlipidemia LDL goal <70 11/03/2014   Seborrheic dermatitis 03/14/2015   Memory changes 05/05/2015   Insomnia 08/08/2015   Bilateral leg pain 08/08/2015   Shoulder bursitis 08/08/2015   Avitaminosis D 10/09/2012   Colon polyp 04/24/2011   Tennis elbow syndrome 03/29/2016   RLS (restless legs syndrome) 03/29/2016   Right shoulder pain 03/29/2016   Degenerative joint disease of right shoulder 04/01/2016   Cataract cortical, senile, left 06/04/2016   Elevated serum creatinine 12/10/2016   Tricompartment osteoarthritis of knees, bilateral 06/25/2017   Trigger finger, left ring finger 06/26/2017   Chronic pain of both knees 06/26/2017   Carpal tunnel syndrome, right 06/26/2017   Dry skin dermatitis 06/26/2017   Trochanteric bursitis 06/27/2017   Fear of flying 09/28/2017   Orthostatic hypotension 12/16/2017    Syncope and collapse 12/16/2017   Mitral regurgitation 12/19/2017   Tricuspid regurgitation 12/19/2017   Raynaud's phenomenon without gangrene 02/03/2018   Concussion 12/08/2017   Essential hypertension 12/10/2017   Combined hyperlipidemia associated with type 2 diabetes mellitus ([place]) 04/24/2011   Itching 04/27/2018   Chronic pain of both ankles 04/27/2018   Bilateral carpal tunnel syndrome 04/27/2018   Statin intolerance 07/03/2018   Leg cramping 09/03/2019   Toe pain, right 12/01/2019   Degenerative disc disease, cervical 12/07/2019   Paronychia of great toe, left 02/07/2020   Myalgia 03/14/2020   Ingrown toenail of right foot 03/14/2020   Iron deficiency anemia 06/13/2020   Facet arthritis, degenerative, lumbar spine 06/28/2020   Acute left-sided low back pain with left-sided sciatica 06/28/2020   Resolved Ambulatory Problems    Diagnosis Date Noted   Eustachian tube dysfunction 05/02/2014   Controlled type 2 diabetes mellitus without complication, without long-term current use of insulin ([place]) 06/26/2017   Flatulence 09/28/2017   Nausea 12/16/2017   Hematoma and contusion 12/16/2017   Past Medical History:  Diagnosis Date   DIABETES MELLITUS, TYPE II 11/09/2007   Hyperlipidemia      Review of Systems See HPI.     Objective:   Physical Exam Vitals reviewed.  Constitutional:      Appearance: Normal appearance.  Cardiovascular:     Rate and Rhythm: Normal rate and regular rhythm.     Pulses: Normal pulses.  Pulmonary:     Effort: Pulmonary effort is normal.  Musculoskeletal:     Right lower leg: No edema.     Left lower leg: No edema.  Comments: Decreased ROM at the waist with extension.  Tenderness over left paraspinal muscles of lumbar spine.  Negative SLR, bilaterally.  5/5 strength lower ext., bilaterally.  2+ patellar reflexes, bilaterally.   Neurological:     General: No focal deficit present.     Mental Status: She is  alert and oriented to person, place, and time.  Psychiatric:        Mood and Affect: Mood normal.           Assessment & Plan:  [name] was seen today for pain.  Diagnoses and all orders for this visit:  Acute left-sided low back pain with left-sided sciatica -     HYDROcodone-acetaminophen (NORCO/VICODIN) 5-325 MG tablet; Take 1 tablet by mouth every 8 (eight) hours as needed for up to 5 days for moderate pain. -     predniSONE (DELTASONE) 50 MG tablet; One tab PO daily for 5 days. -     ketorolac (TORADOL) injection 60 mg  Facet arthritis, degenerative, lumbar spine -     HYDROcodone-acetaminophen (NORCO/VICODIN) 5-325 MG tablet; Take 1 tablet by mouth every 8 (eight) hours as needed for up to 5 days for moderate pain. -     predniSONE (DELTASONE) 50 MG tablet; One tab PO daily for 5 days. -     ketorolac (TORADOL) injection 60 mg   .[name] KitchenPDMP reviewed during this encounter. No concerns small quanity given for as needed break through pain.   No red flags for back pain. Lumbar xray confirms DDD.  Suspect this is a flare of more her facet arthritis since pain with extension.  Toradol shot given in office today.  Hold all other anti-inflammatories until after she finishes the prednisone burst given.  She can increase her diclofenac up to twice a day at that time then.  Continue with TENS unit, IcyHot patches, heat, ice and stretches.  Patient was given some stretches that she can do to strengthen her back.  Has always watch her posture.  Follow-up as needed or symptoms do not improve or worsen.

## 2020-06-28 ENCOUNTER — Encounter: Payer: Self-pay | Admitting: Physician Assistant

## 2020-06-28 DIAGNOSIS — M5442 Lumbago with sciatica, left side: Secondary | ICD-10-CM | POA: Insufficient documentation

## 2020-06-28 DIAGNOSIS — M47816 Spondylosis without myelopathy or radiculopathy, lumbar region: Secondary | ICD-10-CM | POA: Insufficient documentation

## 2020-08-19 ENCOUNTER — Other Ambulatory Visit: Payer: Self-pay | Admitting: Physician Assistant

## 2020-08-26 ENCOUNTER — Other Ambulatory Visit: Payer: Self-pay | Admitting: Physician Assistant

## 2020-08-26 DIAGNOSIS — G2581 Restless legs syndrome: Secondary | ICD-10-CM

## 2020-09-13 ENCOUNTER — Ambulatory Visit (INDEPENDENT_AMBULATORY_CARE_PROVIDER_SITE_OTHER): Payer: Medicare HMO | Admitting: Physician Assistant

## 2020-09-13 ENCOUNTER — Other Ambulatory Visit: Payer: Self-pay

## 2020-09-13 VITALS — BP 133/70 | HR 66 | Ht 66.5 in | Wt 172.0 lb

## 2020-09-13 DIAGNOSIS — G2581 Restless legs syndrome: Secondary | ICD-10-CM | POA: Diagnosis not present

## 2020-09-13 DIAGNOSIS — M25561 Pain in right knee: Secondary | ICD-10-CM | POA: Diagnosis not present

## 2020-09-13 DIAGNOSIS — M25571 Pain in right ankle and joints of right foot: Secondary | ICD-10-CM | POA: Diagnosis not present

## 2020-09-13 DIAGNOSIS — I10 Essential (primary) hypertension: Secondary | ICD-10-CM | POA: Diagnosis not present

## 2020-09-13 DIAGNOSIS — M25562 Pain in left knee: Secondary | ICD-10-CM

## 2020-09-13 DIAGNOSIS — E1169 Type 2 diabetes mellitus with other specified complication: Secondary | ICD-10-CM | POA: Diagnosis not present

## 2020-09-13 DIAGNOSIS — G8929 Other chronic pain: Secondary | ICD-10-CM

## 2020-09-13 DIAGNOSIS — E785 Hyperlipidemia, unspecified: Secondary | ICD-10-CM

## 2020-09-13 DIAGNOSIS — E118 Type 2 diabetes mellitus with unspecified complications: Secondary | ICD-10-CM

## 2020-09-13 DIAGNOSIS — Z1329 Encounter for screening for other suspected endocrine disorder: Secondary | ICD-10-CM | POA: Diagnosis not present

## 2020-09-13 DIAGNOSIS — E782 Mixed hyperlipidemia: Secondary | ICD-10-CM

## 2020-09-13 DIAGNOSIS — Z79899 Other long term (current) drug therapy: Secondary | ICD-10-CM

## 2020-09-13 DIAGNOSIS — M25572 Pain in left ankle and joints of left foot: Secondary | ICD-10-CM

## 2020-09-13 LAB — POCT GLYCOSYLATED HEMOGLOBIN (HGB A1C): Hemoglobin A1C: 6.5 % — AB (ref 4.0–5.6)

## 2020-09-13 MED ORDER — ROSUVASTATIN CALCIUM 20 MG PO TABS: 20.0000 mg | ORAL_TABLET | Freq: Every day | ORAL | 3 refills | Status: DC

## 2020-09-13 MED ORDER — DICLOFENAC SODIUM 75 MG PO TBEC
75.0000 mg | DELAYED_RELEASE_TABLET | Freq: Two times a day (BID) | ORAL | 3 refills | Status: DC
Start: 2020-09-13 — End: 2021-09-17

## 2020-09-13 MED ORDER — ROPINIROLE HCL 0.5 MG PO TABS: 0.5000 mg | ORAL_TABLET | Freq: Three times a day (TID) | ORAL | 3 refills | Status: DC

## 2020-09-13 MED ORDER — METFORMIN HCL 500 MG PO TABS: ORAL_TABLET | ORAL | 3 refills | Status: DC

## 2020-09-13 MED ORDER — LISINOPRIL 2.5 MG PO TABS: 2.5000 mg | ORAL_TABLET | Freq: Every day | ORAL | 3 refills | Status: DC

## 2020-09-13 NOTE — Progress Notes (Signed)
Subjective:    Patient ID: Ann Griffith, female    DOB: 09-27-1950, 70 y.o.   MRN: [id number]  HPI  Patient is a 70 year old female with type 2 diabetes, hyperlipidemia, RLS, iron deficiency anemia who presents to the clinic for her 60-month follow-up.  Overall patient is doing well. She is not checking her sugars regularly. She is compliant with her diabetic medication. She denies any hypoglycemia, open sores or open wounds. She is trying to generally eat better and keep active.  Patient's restless legs symptoms are controlled on Requip. She denies any concerns or complaints.  .. Active Ambulatory Problems    Diagnosis Date Noted  . Type 2 diabetes mellitus with complication, without long-term current use of insulin ([place]) 11/09/2007  . 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 CHOLELITHIASIS, HX OF 11/09/2007  . Diverticulosis 06/07/2013  . Raynaud's syndrome 08/09/2013  . BPPV (benign paroxysmal positional vertigo), right 11/02/2014  . Microalbuminuria 11/02/2014  . Hyperlipidemia LDL goal <70 11/03/2014  . Seborrheic dermatitis 03/14/2015  . Memory changes 05/05/2015  . Insomnia 08/08/2015  . Bilateral leg pain 08/08/2015  . Shoulder bursitis 08/08/2015  . Avitaminosis D 10/09/2012  . Colon polyp 04/24/2011  . Tennis elbow syndrome 03/29/2016  . RLS (restless legs syndrome) 03/29/2016  . Right shoulder pain 03/29/2016  . 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 Cataract cortical, senile, left 06/04/2016  . Elevated serum creatinine 12/10/2016  . 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 Trigger finger, left ring finger 06/26/2017  . Chronic pain of both knees 06/26/2017  . Carpal tunnel syndrome, right 06/26/2017  . Dry skin dermatitis 06/26/2017  . Trochanteric bursitis 06/27/2017  . Fear of flying 09/28/2017  . Orthostatic hypotension 12/16/2017  . Syncope and collapse 12/16/2017  . Mitral regurgitation 12/19/2017  . Tricuspid  regurgitation 12/19/2017  . Raynaud's phenomenon without gangrene 02/03/2018  . Concussion 12/08/2017  . Essential hypertension 12/10/2017  . Combined hyperlipidemia associated with type 2 diabetes mellitus ([place]) 04/24/2011  . Itching 04/27/2018  . Chronic pain of both ankles 04/27/2018  . Bilateral carpal tunnel syndrome 04/27/2018  . Statin intolerance 07/03/2018  . Leg cramping 09/03/2019  . Toe pain, right 12/01/2019  . Degenerative disc disease, cervical 12/07/2019  . Paronychia of great toe, left 02/07/2020  . Myalgia 03/14/2020  . Ingrown toenail of right foot 03/14/2020  . Iron deficiency anemia 06/13/2020  . Facet arthritis, degenerative, lumbar spine 06/28/2020  . Acute left-sided low back pain with left-sided sciatica 06/28/2020   Resolved Ambulatory Problems    Diagnosis Date Noted  . Eustachian tube dysfunction 05/02/2014  . Controlled type 2 diabetes mellitus without complication, without long-term current use of insulin ([place]) 06/26/2017  . Flatulence 09/28/2017  . Nausea 12/16/2017  . Hematoma and contusion 12/16/2017   Past Medical History:  Diagnosis Date  . DIABETES MELLITUS, TYPE II 11/09/2007  . Hyperlipidemia      Review of Systems  All other systems reviewed and are negative.      Objective:   Physical Exam Vitals reviewed.  Constitutional:      Appearance: Normal appearance.  Neck:     Vascular: No carotid bruit.  Cardiovascular:     Rate and Rhythm: Normal rate and regular rhythm.     Pulses: Normal pulses.     Heart sounds: Murmur heard.    Pulmonary:     Effort: Pulmonary effort is normal.     Breath sounds: Normal breath sounds.  Neurological:  General: No focal deficit present.     Mental Status: She is alert and oriented to person, place, and time.  Psychiatric:        Mood and Affect: Mood normal.     .. Results for orders placed or performed in visit on 09/13/20  COMPLETE METABOLIC PANEL WITH GFR  Result Value Ref  Range   Glucose, Bld 132 (H) 65 - 99 mg/dL   BUN 14 7 - 25 mg/dL   Creat 0.97 (H) 0.60 - 0.93 mg/dL   GFR, Est Non African American 59 (L) > OR = 60 mL/min/1.71m2   GFR, Est African American 69 > OR = 60 mL/min/1.79m2   BUN/Creatinine Ratio 14 6 - 22 (calc)   Sodium 136 135 - 146 mmol/L   Potassium 4.2 3.5 - 5.3 mmol/L   Chloride 102 98 - 110 mmol/L   CO2 25 20 - 32 mmol/L   Calcium 9.3 8.6 - 10.4 mg/dL   Total Protein 6.8 6.1 - 8.1 g/dL   Albumin 4.3 3.6 - 5.1 g/dL   Globulin 2.5 1.9 - 3.7 g/dL (calc)   AG Ratio 1.7 1.0 - 2.5 (calc)   Total Bilirubin 0.3 0.2 - 1.2 mg/dL   Alkaline phosphatase (APISO) 44 37 - 153 U/L   AST 17 10 - 35 U/L   ALT 15 6 - 29 U/L  CBC with Differential/Platelet  Result Value Ref Range   WBC 5.0 3.8 - 10.8 Thousand/uL   RBC 3.42 (L) 3.80 - 5.10 Million/uL   Hemoglobin 11.4 (L) 11.7 - 15.5 g/dL   HCT 33.1 (L) 35.0 - 45.0 %   MCV 96.8 80.0 - 100.0 fL   MCH 33.3 (H) 27.0 - 33.0 pg   MCHC 34.4 32.0 - 36.0 g/dL   RDW 12.2 11.0 - 15.0 %   Platelets 236 140 - 400 Thousand/uL   MPV 10.4 7.5 - 12.5 fL   Neutro Abs 2,610 1,500 - 7,800 cells/uL   Lymphs Abs 1,620 850 - 3,900 cells/uL   Absolute Monocytes 410 200 - 950 cells/uL   Eosinophils Absolute 320 15 - 500 cells/uL   Basophils Absolute 40 0 - 200 cells/uL   Neutrophils Relative % 52.2 %   Total Lymphocyte 32.4 %   Monocytes Relative 8.2 %   Eosinophils Relative 6.4 %   Basophils Relative 0.8 %  Lipid Panel w/reflex Direct LDL  Result Value Ref Range   Cholesterol 173 <200 mg/dL   HDL 80 > OR = 50 mg/dL   Triglycerides 67 <150 mg/dL   LDL Cholesterol (Calc) 79 mg/dL (calc)   Total CHOL/HDL Ratio 2.2 <5.0 (calc)   Non-HDL Cholesterol (Calc) 93 <130 mg/dL (calc)  TSH  Result Value Ref Range   TSH 2.98 0.40 - 4.50 mIU/L  POCT glycosylated hemoglobin (Hb A1C)  Result Value Ref Range   Hemoglobin A1C 6.5 (A) 4.0 - 5.6 %   HbA1c POC (<> result, manual entry)     HbA1c, POC (prediabetic range)      HbA1c, POC (controlled diabetic range)           Assessment & Plan:  [name] was seen today for diabetes.  Diagnoses and all orders for this visit:  Type 2 diabetes mellitus with complication, without long-term current use of insulin ([facility]) -     POCT glycosylated hemoglobin (Hb A1C) -     COMPLETE METABOLIC PANEL WITH GFR -     lisinopril (ZESTRIL) 2.5 MG tablet; Take 1 tablet (2.5 mg total)  by mouth daily. -     metFORMIN (GLUCOPHAGE) 500 MG tablet; TAKE 2 TABLETS BY MOUTH EVERY DAY WITH BREAKFAST  Essential hypertension -     COMPLETE METABOLIC PANEL WITH GFR -     lisinopril (ZESTRIL) 2.5 MG tablet; Take 1 tablet (2.5 mg total) by mouth daily.  Combined hyperlipidemia associated with type 2 diabetes mellitus ([place]) -     Lipid Panel w/reflex Direct LDL  Medication management -     CBC with Differential/Platelet  Screening for thyroid disorder -     TSH  Chronic pain of both knees -     diclofenac (VOLTAREN) 75 MG EC tablet; Take 1 tablet (75 mg total) by mouth 2 (two) times daily.  Chronic pain of both ankles -     diclofenac (VOLTAREN) 75 MG EC tablet; Take 1 tablet (75 mg total) by mouth 2 (two) times daily.  RLS (restless legs syndrome) -     rOPINIRole (REQUIP) 0.5 MG tablet; Take 1 tablet (0.5 mg total) by mouth 3 (three) times daily.  Hyperlipidemia LDL goal <70 -     rosuvastatin (CRESTOR) 20 MG tablet; Take 1 tablet (20 mg total) by mouth daily.   A1c is controlled at 6.5 Continue on same medication regimen Patient is on ACE and blood pressure to goal Patient is on statin. Statins historically cause her restless legs to be worse. She is going to start taking her statin every other day to see if it helps even more with her restless leg symptoms and if we can get to our goal of under 70 Up-to-date eye exam Up-to-date foot exam Patient has two of her COVID vaccines but declines getting another booster Flu and pneumonia vaccine UTD.   Refills sent.  Follow up in 3 months.

## 2020-09-14 DIAGNOSIS — E782 Mixed hyperlipidemia: Secondary | ICD-10-CM | POA: Diagnosis not present

## 2020-09-14 DIAGNOSIS — H524 Presbyopia: Secondary | ICD-10-CM | POA: Diagnosis not present

## 2020-09-14 DIAGNOSIS — H43813 Vitreous degeneration, bilateral: Secondary | ICD-10-CM | POA: Diagnosis not present

## 2020-09-14 DIAGNOSIS — E1169 Type 2 diabetes mellitus with other specified complication: Secondary | ICD-10-CM | POA: Diagnosis not present

## 2020-09-14 DIAGNOSIS — Z79899 Other long term (current) drug therapy: Secondary | ICD-10-CM | POA: Diagnosis not present

## 2020-09-14 DIAGNOSIS — E119 Type 2 diabetes mellitus without complications: Secondary | ICD-10-CM | POA: Diagnosis not present

## 2020-09-14 DIAGNOSIS — I10 Essential (primary) hypertension: Secondary | ICD-10-CM | POA: Diagnosis not present

## 2020-09-14 DIAGNOSIS — Z1329 Encounter for screening for other suspected endocrine disorder: Secondary | ICD-10-CM | POA: Diagnosis not present

## 2020-09-14 DIAGNOSIS — H5203 Hypermetropia, bilateral: Secondary | ICD-10-CM | POA: Diagnosis not present

## 2020-09-14 DIAGNOSIS — H2511 Age-related nuclear cataract, right eye: Secondary | ICD-10-CM | POA: Diagnosis not present

## 2020-09-14 DIAGNOSIS — H04123 Dry eye syndrome of bilateral lacrimal glands: Secondary | ICD-10-CM | POA: Diagnosis not present

## 2020-09-14 DIAGNOSIS — Z961 Presence of intraocular lens: Secondary | ICD-10-CM | POA: Diagnosis not present

## 2020-09-14 DIAGNOSIS — E118 Type 2 diabetes mellitus with unspecified complications: Secondary | ICD-10-CM | POA: Diagnosis not present

## 2020-09-14 LAB — HM DIABETES EYE EXAM

## 2020-09-15 ENCOUNTER — Encounter: Payer: Self-pay | Admitting: Physician Assistant

## 2020-09-15 LAB — CBC WITH DIFFERENTIAL/PLATELET
Absolute Monocytes: 410 cells/uL (ref 200–950)
Basophils Absolute: 40 cells/uL (ref 0–200)
Basophils Relative: 0.8 %
Eosinophils Absolute: 320 cells/uL (ref 15–500)
Eosinophils Relative: 6.4 %
HCT: 33.1 % — ABNORMAL LOW (ref 35.0–45.0)
Hemoglobin: 11.4 g/dL — ABNORMAL LOW (ref 11.7–15.5)
Lymphs Abs: 1620 cells/uL (ref 850–3900)
MCH: 33.3 pg — ABNORMAL HIGH (ref 27.0–33.0)
MCHC: 34.4 g/dL (ref 32.0–36.0)
MCV: 96.8 fL (ref 80.0–100.0)
MPV: 10.4 fL (ref 7.5–12.5)
Monocytes Relative: 8.2 %
Neutro Abs: 2610 cells/uL (ref 1500–7800)
Neutrophils Relative %: 52.2 %
Platelets: 236 10*3/uL (ref 140–400)
RBC: 3.42 10*6/uL — ABNORMAL LOW (ref 3.80–5.10)
RDW: 12.2 % (ref 11.0–15.0)
Total Lymphocyte: 32.4 %
WBC: 5 10*3/uL (ref 3.8–10.8)

## 2020-09-15 LAB — LIPID PANEL W/REFLEX DIRECT LDL
Cholesterol: 173 mg/dL (ref <200)
HDL: 80 mg/dL (ref >=50)
LDL Cholesterol (Calc): 79 mg/dL (calc)
Non-HDL Cholesterol (Calc): 93 mg/dL (calc) (ref <130)
Total CHOL/HDL Ratio: 2.2 (calc) (ref <5.0)
Triglycerides: 67 mg/dL (ref <150)

## 2020-09-15 LAB — COMPLETE METABOLIC PANEL WITH GFR
AG Ratio: 1.7 (calc) (ref 1.0–2.5)
ALT: 15 U/L (ref 6–29)
AST: 17 U/L (ref 10–35)
Albumin: 4.3 g/dL (ref 3.6–5.1)
Alkaline phosphatase (APISO): 44 U/L (ref 37–153)
BUN/Creatinine Ratio: 14 (calc) (ref 6–22)
BUN: 14 mg/dL (ref 7–25)
CO2: 25 mmol/L (ref 20–32)
Calcium: 9.3 mg/dL (ref 8.6–10.4)
Chloride: 102 mmol/L (ref 98–110)
Creat: 0.97 mg/dL — ABNORMAL HIGH (ref 0.60–0.93)
GFR, Est African American: 69 mL/min/{1.73_m2} (ref >=60)
GFR, Est Non African American: 59 mL/min/{1.73_m2} — ABNORMAL LOW (ref >=60)
Globulin: 2.5 g/dL (calc) (ref 1.9–3.7)
Glucose, Bld: 132 mg/dL — ABNORMAL HIGH (ref 65–99)
Potassium: 4.2 mmol/L (ref 3.5–5.3)
Sodium: 136 mmol/L (ref 135–146)
Total Bilirubin: 0.3 mg/dL (ref 0.2–1.2)
Total Protein: 6.8 g/dL (ref 6.1–8.1)

## 2020-09-15 LAB — TSH: TSH: 2.98 mIU/L (ref 0.40–4.50)

## 2020-09-15 NOTE — Progress Notes (Signed)
Prakriti,   Kidney function a little bit improved from baseline.  Cholesterol is fantastic!!! Thyroid looks great and stable.  Anemia is stable but still anemic. How much iron are you taking daily?

## 2020-09-18 ENCOUNTER — Telehealth: Payer: Self-pay | Admitting: General Practice

## 2020-09-22 ENCOUNTER — Ambulatory Visit: Payer: Medicare HMO

## 2020-09-29 NOTE — Telephone Encounter (Signed)
Documentation only.

## 2020-10-06 ENCOUNTER — Ambulatory Visit (INDEPENDENT_AMBULATORY_CARE_PROVIDER_SITE_OTHER): Payer: Medicare HMO | Admitting: Physician Assistant

## 2020-10-06 ENCOUNTER — Other Ambulatory Visit: Payer: Self-pay

## 2020-10-06 VITALS — BP 129/80 | HR 72 | Temp 98.4°F | Resp 18 | Ht 66.5 in | Wt 174.1 lb

## 2020-10-06 DIAGNOSIS — Z0000 Encounter for general adult medical examination without abnormal findings: Secondary | ICD-10-CM

## 2020-10-06 NOTE — Progress Notes (Signed)
MEDICARE ANNUAL WELLNESS VISIT  10/06/2020  Subjective:  Ann Griffith is a 70 y.o. female patient of Alden Hipp, Royetta Car, PA-C who had a [employer] Visit today. Torre is Retired and lives with their spouse. she has 1 child. she reports that she is socially active and does interact with friends/family regularly. she is moderately physically active and enjoys travelling and hanging out with friends and neighbors.  Patient Care Team: Lavada Mesi as PCP - General (Family Medicine) Megan Salon, MD as Consulting Physician (Gynecology)  Advanced Directives 10/06/2020 04/12/2019  Does Patient Have a Medical Advance Directive? Yes Yes  Type of Paramedic of [place];Living will Living will  Does patient want to make changes to medical advance directive? No - Patient declined No - Patient declined  Copy of [place] in Chart? No - copy requested [facility] Utilization Over the Past 12 Months: # of hospitalizations or ER visits: 0 # of surgeries: 0  Review of Systems    Patient reports that her overall health is unchanged when compared to last year.  Review of Systems: History obtained from chart review and the patient  All other systems negative.  Pain Assessment Pain : No/denies pain     Current Medications & Allergies (verified) Allergies as of 10/06/2020      Reactions   Amoxicillin Swelling   Gabapentin    dizzy   Atorvastatin Other (See Comments)   Memory loss      Medication List       Accurate as of October 06, 2020 10:35 AM. If you have any questions, ask your nurse or doctor.        amLODipine 2.5 MG tablet Commonly known as: NORVASC Take 1-2 tablets (2.5-5 mg total) by mouth daily.   diclofenac 75 MG EC tablet Commonly known as: VOLTAREN Take 1 tablet (75 mg total) by mouth 2 (two) times daily.   diclofenac Sodium 1 % Gel Commonly known as: VOLTAREN APPLY 4 GRAMS 4 TIMES DAILY  TOPICALLY. TO AFFECTED JOINT.   ferrous sulfate 325 (65 FE) MG EC tablet Take 1 tablet (325 mg total) by mouth daily with breakfast.   fish oil-omega-3 fatty acids 1000 MG capsule Take 1 g by mouth daily.   lisinopril 2.5 MG tablet Commonly known as: ZESTRIL Take 1 tablet (2.5 mg total) by mouth daily.   MAGNESIUM PO Take by mouth.   metFORMIN 500 MG tablet Commonly known as: GLUCOPHAGE TAKE 2 TABLETS BY MOUTH EVERY DAY WITH BREAKFAST   rOPINIRole 0.5 MG tablet Commonly known as: REQUIP Take 1 tablet (0.5 mg total) by mouth 3 (three) times daily.   rosuvastatin 20 MG tablet Commonly known as: CRESTOR Take 1 tablet (20 mg total) by mouth daily.   TURMERIC PO Take by mouth.   VITAMIN B-12 PO Take 5,000 mcg by mouth every other day.   VITAMIN D PO Take 5,000 Int'l Units by mouth.       History (reviewed): Past Medical History:  Diagnosis Date  . CHOLELITHIASIS, HX OF 11/09/2007   Qualifier: Diagnosis of  By: [name] Mcalpine    . DIABETES MELLITUS, TYPE II 11/09/2007   Qualifier: Diagnosis of  By: [name] Mcalpine    . Diverticulosis   . Hyperlipidemia    Past Surgical History:  Procedure Laterality Date  . bone spur removed  2003   left heel  . CATARACT EXTRACTION Left 02/2016  . CHOLECYSTECTOMY    .  COLONOSCOPY  08/2003   Family History  Problem Relation Age of Onset  . Diabetes Mother    Social History   Socioeconomic History  . Marital status: Married    Spouse name: Not on file  . Number of children: 1  . Years of education: 34  . Highest education level: High school graduate  Occupational History  . Occupation: data entry clerk in receiving    Comment: Retired  Tobacco Use  . Smoking status: Never Smoker  . Smokeless tobacco: Never Used  Vaping Use  . Vaping Use: Never used  Substance and Sexual Activity  . Alcohol use: Yes    Alcohol/week: 0.0 - 1.0 standard drinks    Comment: occasionally  . Drug use: No  . Sexual activity: Yes     Partners: Male    Birth control/protection: Post-menopausal  Other Topics Concern  . Not on file  Social History Narrative   Lives with her husband. Likes to go to movies and travel. Works out 3 times a week for 1.5 hrs.    Social Determinants of Health   Financial Resource Strain: Low Risk   . Difficulty of Paying Living Expenses: Not hard at all  Food Insecurity: No Food Insecurity  . Worried About Charity fundraiser in the Last Year: Never true  . Ran Out of Food in the Last Year: Never true  Transportation Needs: No Transportation Needs  . Lack of Transportation (Medical): No  . Lack of Transportation (Non-Medical): No  Physical Activity: Sufficiently Active  . Days of Exercise per Week: 3 days  . Minutes of Exercise per Session: 150+ min  Stress: No Stress Concern Present  . Feeling of Stress : Not at all  Social Connections: Moderately Isolated  . Frequency of Communication with Friends and Family: Twice a week  . Frequency of Social Gatherings with Friends and Family: More than three times a week  . Attends Religious Services: Never  . Active Member of Clubs or Organizations: No  . Attends Archivist Meetings: Never  . Marital Status: Married    Activities of Daily Living In your present state of health, do you have any difficulty performing the following activities: 10/06/2020  Hearing? N  Vision? Y  Comment cataract- has seen an opthamologist  Difficulty concentrating or making decisions? Y  Comment Gradual since her reaction to the medication; feels that it is related to age.  Walking or climbing stairs? N  Dressing or bathing? N  Doing errands, shopping? N  Preparing Food and eating ? N  Using the Toilet? N  In the past six months, have you accidently leaked urine? N  Do you have problems with loss of bowel control? N  Managing your Medications? N  Managing your Finances? N  Housekeeping or managing your Housekeeping? N  Some recent data might  be hidden    Patient Education/Literacy How often do you need to have someone help you when you read instructions, pamphlets, or other written materials from your doctor or pharmacy?: 1 - Never What is the last grade level you completed in school?: 12th grade  Exercise Current Exercise Habits: Home exercise routine;Structured exercise class, Type of exercise: strength training/weights;walking;treadmill, Time (Minutes): 60, Frequency (Times/Week): 3, Weekly Exercise (Minutes/Week): 180, Intensity: Moderate, Exercise limited by: None identified  Diet Patient reports consuming 2 meals a day and 2 snack(s) a day Patient reports that her primary diet is: Regular Patient reports that she does have regular access to food.  Depression Screen PHQ 2/9 Scores 10/06/2020 06/13/2020 01/27/2019 10/26/2018 02/02/2018 06/24/2017 12/03/2016  PHQ - 2 Score 0 0 0 0 0 0 0  PHQ- 9 Score 0 0 0 0 0 - -     Fall Risk Fall Risk  10/06/2020 06/13/2020 12/15/2017  Falls in the past year? 0 0 Yes  Number falls in past yr: 0 0 2 or more  Injury with Fall? 0 0 Yes  Risk for fall due to : No Fall Risks - -  Follow up Falls evaluation completed Falls evaluation completed -     Objective:   BP 129/80 (BP Location: Left Arm, Patient Position: Sitting, Cuff Size: Normal)   Pulse 72   Temp 98.4 F (36.9 C) (Oral)   Resp 18   Ht 5' 6.5" (1.689 m)   Wt 174 lb 1.3 oz (79 kg)   LMP 08/12/2000   SpO2 100%   BMI 27.68 kg/m   Last Weight  Most recent update: 10/06/2020 10:01 AM   Weight  79 kg (174 lb 1.3 oz)            Body mass index is 27.68 kg/m.  Hearing/Vision  . Ravyn did not have difficulty with hearing/understanding during the face-to-face interview . Zena did not have difficulty with her vision during the face-to-face interview . Reports that she has had a formal eye exam by an eye care professional within the past year . Reports that she has not had a formal hearing evaluation within the past  year  Cognitive Function: 6CIT Screen 10/06/2020  What Year? 0 points  What month? 0 points  What time? 0 points  Count back from 20 0 points  Months in reverse 0 points  Repeat phrase 0 points  Total Score 0    Normal Cognitive Function Screening: Yes (Normal:0-7, Significant for Dysfunction: >8)  Immunization & Health Maintenance Record Immunization History  Administered Date(s) Administered  . Fluad Quad(high Dose 65+) 05/31/2019, 06/13/2020  . Influenza,inj,Quad PF,6+ Mos 05/02/2014, 05/05/2015, 06/04/2016, 06/24/2017, 04/27/2018  . PFIZER(Purple Top)SARS-COV-2 Vaccination 10/05/2019, 10/26/2019, 04/28/2020  . Pneumococcal Conjugate-13 05/05/2015  . Pneumococcal Polysaccharide-23 06/04/2016  . Tdap 12/21/2009, 10/14/2012  . Zoster Recombinat (Shingrix) 11/17/2019, 03/28/2020    Health Maintenance  Topic Date Due  . HEMOGLOBIN A1C  03/13/2021  . FOOT EXAM  09/15/2021  . OPHTHALMOLOGY EXAM  09/16/2021  . MAMMOGRAM  04/13/2022  . TETANUS/TDAP  10/15/2022  . COLONOSCOPY (Pts 45-29yrs Insurance coverage will need to be confirmed)  06/04/2023  . INFLUENZA VACCINE  Completed  . DEXA SCAN  Completed  . COVID-19 Vaccine  Completed  . Hepatitis C Screening  Completed  . PNA vac Low Risk Adult  Completed       Assessment  This is a routine wellness examination for [employer].  Health Maintenance: Due or Overdue There are no preventive care reminders to display for this patient.  Tamira Shock does not need a referral for [employer] Assistance: Care Management:   no Social Work:    no Prescription Assistance:  no Nutrition/Diabetes Education:  no   Plan:  Personalized Goals Goals Addressed              This Visit's Progress   .  Patient Stated (pt-stated)        10/06/2020 AWV Goal: Improved Nutrition/Diet  . Patient will verbalize understanding that diet plays an important role in overall health and that a poor diet is a risk factor for many chronic  medical conditions.  [name]  Over the next year, patient will improve self management of their diet by incorporating more water. . Patient will utilize available community resources to help with food acquisition if needed (ex: food pantries, [address], etc) . Patient will work with nutrition specialist if a referral was made       Personalized Health Maintenance & Screening Recommendations  All up to date.  Lung Cancer Screening Recommended: no (Low Dose CT Chest recommended if Age 39-80 years, 30 pack-year currently smoking OR have quit w/in past 15 years) Hepatitis C Screening recommended: no HIV Screening recommended: no  Advanced Directives: Written information was not given per the patient's request.  Referrals & Orders No orders of the defined types were placed in this encounter.   Follow-up Plan . Follow-up with Donella Stade, PA-C as planned . Medicare wellness exam in one year.   I have personally reviewed and noted the following in the patient's chart:   . Medical and social history . Use of alcohol, tobacco or illicit drugs  . Current medications and supplements . Functional ability and status . Nutritional status . Physical activity . Advanced directives . List of other physicians . Hospitalizations, surgeries, and ER visits in previous 12 months . Vitals . Screenings to include cognitive, depression, and falls . Referrals and appointments  In addition, I have reviewed and discussed with patient certain preventive protocols, quality metrics, and best practice recommendations. A written personalized care plan for preventive services as well as general preventive health recommendations were provided to patient.     Tinnie Gens, RN  10/06/2020

## 2020-10-06 NOTE — Patient Instructions (Addendum)
Health Maintenance, Female Adopting a healthy lifestyle and getting preventive care are important in promoting health and wellness. Ask your health care provider about:  The right schedule for you to have regular tests and exams.  Things you can do on your own to prevent diseases and keep yourself healthy. What should I know about diet, weight, and exercise? Eat a healthy diet  Eat a diet that includes plenty of vegetables, fruits, low-fat dairy products, and lean protein.  Do not eat a lot of foods that are high in solid fats, added sugars, or sodium.   Maintain a healthy weight Body mass index (BMI) is used to identify weight problems. It estimates body fat based on height and weight. Your health care provider can help determine your BMI and help you achieve or maintain a healthy weight. Get regular exercise Get regular exercise. This is one of the most important things you can do for your health. Most adults should:  Exercise for at least 150 minutes each week. The exercise should increase your heart rate and make you sweat (moderate-intensity exercise).  Do strengthening exercises at least twice a week. This is in addition to the moderate-intensity exercise.  Spend less time sitting. Even light physical activity can be beneficial. Watch cholesterol and blood lipids Have your blood tested for lipids and cholesterol at 71 years of age, then have this test every 5 years. Have your cholesterol levels checked more often if:  Your lipid or cholesterol levels are high.  You are older than 71 years of age.  You are at high risk for heart disease. What should I know about cancer screening? Depending on your health history and family history, you may need to have cancer screening at various ages. This may include screening for:  Breast cancer.  Cervical cancer.  Colorectal cancer.  Skin cancer.  Lung cancer. What should I know about heart disease, diabetes, and high blood  pressure? Blood pressure and heart disease  High blood pressure causes heart disease and increases the risk of stroke. This is more likely to develop in people who have high blood pressure readings, are of African descent, or are overweight.  Have your blood pressure checked: ? Every 3-5 years if you are 38-35 years of age. ? Every year if you are 19 years old or older. Diabetes Have regular diabetes screenings. This checks your fasting blood sugar level. Have the screening done:  Once every three years after age 64 if you are at a normal weight and have a low risk for diabetes.  More often and at a younger age if you are overweight or have a high risk for diabetes. What should I know about preventing infection? Hepatitis B If you have a higher risk for hepatitis B, you should be screened for this virus. Talk with your health care provider to find out if you are at risk for hepatitis B infection. Hepatitis C Testing is recommended for:  Everyone born from [birth year] through 1966.  Anyone with known risk factors for hepatitis C. Sexually transmitted infections (STIs)  Get screened for STIs, including gonorrhea and chlamydia, if: ? You are sexually active and are younger than 71 years of age. ? You are older than 71 years of age and your health care provider tells you that you are at risk for this type of infection. ? Your sexual activity has changed since you were last screened, and you are at increased risk for chlamydia or gonorrhea. Ask your health care  provider if you are at risk.  Ask your health care provider about whether you are at high risk for HIV. Your health care provider may recommend a prescription medicine to help prevent HIV infection. If you choose to take medicine to prevent HIV, you should first get tested for HIV. You should then be tested every 3 months for as long as you are taking the medicine. Pregnancy  If you are about to stop having your period (premenopausal) and  you may become pregnant, seek counseling before you get pregnant.  Take 400 to 800 micrograms (mcg) of folic acid every day if you become pregnant.  Ask for birth control (contraception) if you want to prevent pregnancy. Osteoporosis and menopause Osteoporosis is a disease in which the bones lose minerals and strength with aging. This can result in bone fractures. If you are 39 years old or older, or if you are at risk for osteoporosis and fractures, ask your health care provider if you should:  Be screened for bone loss.  Take a calcium or vitamin D supplement to lower your risk of fractures.  Be given hormone replacement therapy (HRT) to treat symptoms of menopause. Follow these instructions at home: Lifestyle  Do not use any products that contain nicotine or tobacco, such as cigarettes, e-cigarettes, and chewing tobacco. If you need help quitting, ask your health care provider.  Do not use street drugs.  Do not share needles.  Ask your health care provider for help if you need support or information about quitting drugs. Alcohol use  Do not drink alcohol if: ? Your health care provider tells you not to drink. ? You are pregnant, may be pregnant, or are planning to become pregnant.  If you drink alcohol: ? Limit how much you use to 0-1 drink a day. ? Limit intake if you are breastfeeding.  Be aware of how much alcohol is in your drink. In the [place] one drink equals one 12 oz bottle of beer (355 mL), one 5 oz glass of wine (148 mL), or one 1 oz glass of hard liquor (44 mL). General instructions  Schedule regular health, dental, and eye exams.  Stay current with your vaccines.  Tell your health care provider if: ? You often feel depressed. ? You have ever been abused or do not feel safe at home. Summary  Adopting a healthy lifestyle and getting preventive care are important in promoting health and wellness.  Follow your health care provider's instructions about healthy  diet, exercising, and getting tested or screened for diseases.  Follow your health care provider's instructions on monitoring your cholesterol and blood pressure. This information is not intended to replace advice given to you by your health care provider. Make sure you discuss any questions you have with your health care provider. Document Revised: 07/22/2018 Document Reviewed: 07/22/2018 Elsevier Patient Education  2021 [place] Maintenance Summary and Written Plan of Care  Ms. Feimster ,  Thank you for allowing me to perform your Medicare Annual Wellness Visit and for your ongoing commitment to your health.   Health Maintenance & Immunization History Health Maintenance  Topic Date Due  . HEMOGLOBIN A1C  03/13/2021  . FOOT EXAM  09/15/2021  . OPHTHALMOLOGY EXAM  09/16/2021  . MAMMOGRAM  04/13/2022  . TETANUS/TDAP  10/15/2022  . COLONOSCOPY (Pts 45-31yrs Insurance coverage will need to be confirmed)  06/04/2023  . INFLUENZA VACCINE  Completed  . DEXA SCAN  Completed  .  COVID-19 Vaccine  Completed  . Hepatitis C Screening  Completed  . PNA vac Low Risk Adult  Completed   Immunization History  Administered Date(s) Administered  . Fluad Quad(high Dose 65+) 05/31/2019, 06/13/2020  . Influenza,inj,Quad PF,6+ Mos 05/02/2014, 05/05/2015, 06/04/2016, 06/24/2017, 04/27/2018  . PFIZER(Purple Top)SARS-COV-2 Vaccination 10/05/2019, 10/26/2019, 04/28/2020  . Pneumococcal Conjugate-13 05/05/2015  . Pneumococcal Polysaccharide-23 06/04/2016  . Tdap 12/21/2009, 10/14/2012  . Zoster Recombinat (Shingrix) 11/17/2019, 03/28/2020    These are the patient goals that we discussed: Goals Addressed              This Visit's Progress   .  Patient Stated (pt-stated)        10/06/2020 AWV Goal: Improved Nutrition/Diet  . Patient will verbalize understanding that diet plays an important role in overall health and that a poor diet is a risk  factor for many chronic medical conditions.  . Over the next year, patient will improve self management of their diet by incorporating more water. . Patient will utilize available community resources to help with food acquisition if needed (ex: food pantries, [address], etc) . Patient will work with nutrition specialist if a referral was made         This is a list of Health Maintenance Items that are overdue or due now: There are no preventive care reminders to display for this patient.   Orders/Referrals Placed Today: No orders of the defined types were placed in this encounter.    Follow-up Plan  Follow-up with Donella Stade, PA-C as planned  Medicare wellness exam in one year.

## 2020-10-18 DIAGNOSIS — L821 Other seborrheic keratosis: Secondary | ICD-10-CM | POA: Diagnosis not present

## 2020-10-18 DIAGNOSIS — D225 Melanocytic nevi of trunk: Secondary | ICD-10-CM | POA: Diagnosis not present

## 2020-10-18 DIAGNOSIS — L853 Xerosis cutis: Secondary | ICD-10-CM | POA: Diagnosis not present

## 2020-10-18 DIAGNOSIS — D485 Neoplasm of uncertain behavior of skin: Secondary | ICD-10-CM | POA: Diagnosis not present

## 2020-10-18 DIAGNOSIS — D0439 Carcinoma in situ of skin of other parts of face: Secondary | ICD-10-CM | POA: Diagnosis not present

## 2020-10-18 DIAGNOSIS — L814 Other melanin hyperpigmentation: Secondary | ICD-10-CM | POA: Diagnosis not present

## 2020-10-18 DIAGNOSIS — L578 Other skin changes due to chronic exposure to nonionizing radiation: Secondary | ICD-10-CM | POA: Diagnosis not present

## 2020-10-18 DIAGNOSIS — C44329 Squamous cell carcinoma of skin of other parts of face: Secondary | ICD-10-CM | POA: Diagnosis not present

## 2020-10-24 ENCOUNTER — Encounter: Payer: Self-pay | Admitting: Physician Assistant

## 2020-11-17 ENCOUNTER — Other Ambulatory Visit: Payer: Self-pay | Admitting: Physician Assistant

## 2020-11-17 DIAGNOSIS — G2581 Restless legs syndrome: Secondary | ICD-10-CM

## 2020-11-17 DIAGNOSIS — R252 Cramp and spasm: Secondary | ICD-10-CM

## 2020-11-22 ENCOUNTER — Ambulatory Visit (INDEPENDENT_AMBULATORY_CARE_PROVIDER_SITE_OTHER): Payer: Medicare HMO | Admitting: Physician Assistant

## 2020-11-22 ENCOUNTER — Other Ambulatory Visit: Payer: Self-pay

## 2020-11-22 VITALS — BP 148/65 | HR 85 | Ht 66.5 in | Wt 175.0 lb

## 2020-11-22 DIAGNOSIS — L089 Local infection of the skin and subcutaneous tissue, unspecified: Secondary | ICD-10-CM

## 2020-11-22 DIAGNOSIS — L853 Xerosis cutis: Secondary | ICD-10-CM | POA: Diagnosis not present

## 2020-11-22 DIAGNOSIS — S80811A Abrasion, right lower leg, initial encounter: Secondary | ICD-10-CM

## 2020-11-22 MED ORDER — TRIAMCINOLONE ACETONIDE 0.1 % EX CREA: 1.0000 "application " | TOPICAL_CREAM | Freq: Two times a day (BID) | CUTANEOUS | 0 refills | Status: DC

## 2020-11-22 MED ORDER — DOXYCYCLINE HYCLATE 100 MG PO TABS: 100.0000 mg | ORAL_TABLET | Freq: Two times a day (BID) | ORAL | 0 refills | Status: DC

## 2020-11-22 NOTE — Patient Instructions (Signed)

## 2020-11-23 ENCOUNTER — Encounter: Payer: Self-pay | Admitting: Physician Assistant

## 2020-11-23 NOTE — Progress Notes (Signed)
Subjective:    Patient ID: Ann Griffith, female    DOB: [birth year], 70 y.o.   MRN: [id number]  HPI Patient is a 70 year old female with hypertension, type 2 diabetes who presents to the clinic with a right leg sore, swelling, redness and pain.  She is worried because she is a diabetic and infection.  She has ongoing bilateral leg dry skin dermatitis.  She uses triamcinolone intermittently.  She is ran out.  She scratched her right leg and noticed this appearance a few days later.  She feels like the swelling, pain, redness is getting worse.  She denies any fever, chills, nausea, vomiting.  .. Active Ambulatory Problems    Diagnosis Date Noted  . Type 2 diabetes mellitus with complication, without long-term current use of insulin ([place]) 11/09/2007  . 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 CHOLELITHIASIS, HX OF 11/09/2007  . Diverticulosis 06/07/2013  . Raynaud's syndrome 08/09/2013  . BPPV (benign paroxysmal positional vertigo), right 11/02/2014  . Microalbuminuria 11/02/2014  . Hyperlipidemia LDL goal <70 11/03/2014  . Seborrheic dermatitis 03/14/2015  . Memory changes 05/05/2015  . Insomnia 08/08/2015  . Bilateral leg pain 08/08/2015  . Shoulder bursitis 08/08/2015  . Avitaminosis D 10/09/2012  . Colon polyp 04/24/2011  . Tennis elbow syndrome 03/29/2016  . RLS (restless legs syndrome) 03/29/2016  . Right shoulder pain 03/29/2016  . 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 Cataract cortical, senile, left 06/04/2016  . Elevated serum creatinine 12/10/2016  . 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 Trigger finger, left ring finger 06/26/2017  . Chronic pain of both knees 06/26/2017  . Carpal tunnel syndrome, right 06/26/2017  . Dry skin dermatitis 06/26/2017  . Trochanteric bursitis 06/27/2017  . Fear of flying 09/28/2017  . Orthostatic hypotension 12/16/2017  . Syncope and collapse 12/16/2017  . Mitral regurgitation 12/19/2017  . Tricuspid  regurgitation 12/19/2017  . Raynaud's phenomenon without gangrene 02/03/2018  . Concussion 12/08/2017  . Essential hypertension 12/10/2017  . Combined hyperlipidemia associated with type 2 diabetes mellitus ([place]) 04/24/2011  . Itching 04/27/2018  . Chronic pain of both ankles 04/27/2018  . Bilateral carpal tunnel syndrome 04/27/2018  . Statin intolerance 07/03/2018  . Leg cramping 09/03/2019  . Toe pain, right 12/01/2019  . Degenerative disc disease, cervical 12/07/2019  . Paronychia of great toe, left 02/07/2020  . Myalgia 03/14/2020  . Ingrown toenail of right foot 03/14/2020  . Iron deficiency anemia 06/13/2020  . Facet arthritis, degenerative, lumbar spine 06/28/2020  . Acute left-sided low back pain with left-sided sciatica 06/28/2020   Resolved Ambulatory Problems    Diagnosis Date Noted  . Eustachian tube dysfunction 05/02/2014  . Controlled type 2 diabetes mellitus without complication, without long-term current use of insulin ([place]) 06/26/2017  . Flatulence 09/28/2017  . Nausea 12/16/2017  . Hematoma and contusion 12/16/2017   Past Medical History:  Diagnosis Date  . DIABETES MELLITUS, TYPE II 11/09/2007  . Hyperlipidemia     Review of Systems See HPI.     Objective:   Physical Exam  Right mid anterior lower leg 34mm pustule surrounded by 3cm of erythema, swelling, warmth on all sides.   Age spots and dry skin both legs.       Assessment & Plan:  [name] was seen today for skin problem.  Diagnoses and all orders for this visit:  Abrasion of right lower leg with infection, initial encounter -     doxycycline (VIBRA-TABS) 100 MG tablet; Take 1 tablet (100 mg total)  by mouth 2 (two) times daily.  Dry skin dermatitis -     triamcinolone cream (KENALOG) 0.1 %; Apply 1 application topically 2 (two) times daily.   For dry skin of legs and itching. Use triamcinolone with cetaphil or eucerin/cetaphil.   Looks like a scratch created a little pustule and then  surrounding cellulitis. Sent doxycycline. Stay off of leg for next 24 hours. Keep elevated. Warm compresses. Drew line around area to be alerted if worsening. Follow up as needed.

## 2020-12-11 ENCOUNTER — Encounter: Payer: Self-pay | Admitting: Physician Assistant

## 2020-12-11 ENCOUNTER — Ambulatory Visit (INDEPENDENT_AMBULATORY_CARE_PROVIDER_SITE_OTHER): Payer: Medicare HMO | Admitting: Physician Assistant

## 2020-12-11 ENCOUNTER — Other Ambulatory Visit: Payer: Self-pay

## 2020-12-11 VITALS — BP 139/67 | HR 67 | Ht 66.5 in | Wt 175.0 lb

## 2020-12-11 DIAGNOSIS — M5136 Other intervertebral disc degeneration, lumbar region: Secondary | ICD-10-CM | POA: Diagnosis not present

## 2020-12-11 DIAGNOSIS — M47816 Spondylosis without myelopathy or radiculopathy, lumbar region: Secondary | ICD-10-CM

## 2020-12-11 DIAGNOSIS — E118 Type 2 diabetes mellitus with unspecified complications: Secondary | ICD-10-CM | POA: Diagnosis not present

## 2020-12-11 DIAGNOSIS — I10 Essential (primary) hypertension: Secondary | ICD-10-CM

## 2020-12-11 DIAGNOSIS — E785 Hyperlipidemia, unspecified: Secondary | ICD-10-CM

## 2020-12-11 LAB — POCT GLYCOSYLATED HEMOGLOBIN (HGB A1C): Hemoglobin A1C: 6.9 % — AB (ref 4.0–5.6)

## 2020-12-11 NOTE — Progress Notes (Signed)
Subjective:    Patient ID: Ann Griffith, female    DOB: 08-Dec-1950, 70 y.o.   MRN: [id number]  HPI  Patient is a 70 year old female with type 2 diabetes, hyperlipidemia, hypertension, restless leg, lumbar degenerative disc disease and low back pain who presents to the clinic for 37-month follow-up  Patient is not checking her sugars at home.  She is only taking metformin.  She denies any hypoglycemic events.  She denies any open sores or wounds.  She is compliant with her metformin.  She denies any chest pain, palpitations, headaches or vision changes.  She does have intermittent low back pain.  She denies any saddle anesthesia, loss of strength of extremities, numbness and tingling of extremities.  She is not really doing anything for this.  She denies any new injury..  Last x-rays were August 2020.  .. Active Ambulatory Problems    Diagnosis Date Noted  . Type 2 diabetes mellitus with complication, without long-term current use of insulin ([place]) 11/09/2007  . 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 CHOLELITHIASIS, HX OF 11/09/2007  . Diverticulosis 06/07/2013  . Raynaud's syndrome 08/09/2013  . BPPV (benign paroxysmal positional vertigo), right 11/02/2014  . Microalbuminuria 11/02/2014  . Hyperlipidemia LDL goal <70 11/03/2014  . Seborrheic dermatitis 03/14/2015  . Memory changes 05/05/2015  . Insomnia 08/08/2015  . Bilateral leg pain 08/08/2015  . Shoulder bursitis 08/08/2015  . Avitaminosis D 10/09/2012  . Colon polyp 04/24/2011  . Tennis elbow syndrome 03/29/2016  . RLS (restless legs syndrome) 03/29/2016  . Right shoulder pain 03/29/2016  . 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 Cataract cortical, senile, left 06/04/2016  . Elevated serum creatinine 12/10/2016  . 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 Trigger finger, left ring finger 06/26/2017  . Chronic pain of both knees 06/26/2017  . Carpal tunnel syndrome, right 06/26/2017  . Dry  skin dermatitis 06/26/2017  . Trochanteric bursitis 06/27/2017  . Fear of flying 09/28/2017  . Orthostatic hypotension 12/16/2017  . Syncope and collapse 12/16/2017  . Mitral regurgitation 12/19/2017  . Tricuspid regurgitation 12/19/2017  . Raynaud's phenomenon without gangrene 02/03/2018  . Concussion 12/08/2017  . Essential hypertension 12/10/2017  . Combined hyperlipidemia associated with type 2 diabetes mellitus ([place]) 04/24/2011  . Itching 04/27/2018  . Chronic pain of both ankles 04/27/2018  . Bilateral carpal tunnel syndrome 04/27/2018  . Statin intolerance 07/03/2018  . Leg cramping 09/03/2019  . Toe pain, right 12/01/2019  . Degenerative disc disease, cervical 12/07/2019  . Paronychia of great toe, left 02/07/2020  . Myalgia 03/14/2020  . Ingrown toenail of right foot 03/14/2020  . Iron deficiency anemia 06/13/2020  . Facet arthritis, degenerative, lumbar spine 06/28/2020  . Acute left-sided low back pain with left-sided sciatica 06/28/2020  . DDD (degenerative disc disease), lumbar 12/12/2020   Resolved Ambulatory Problems    Diagnosis Date Noted  . Eustachian tube dysfunction 05/02/2014  . Controlled type 2 diabetes mellitus without complication, without long-term current use of insulin ([place]) 06/26/2017  . Flatulence 09/28/2017  . Nausea 12/16/2017  . Hematoma and contusion 12/16/2017   Past Medical History:  Diagnosis Date  . DIABETES MELLITUS, TYPE II 11/09/2007  . Hyperlipidemia      Review of Systems See HPI.     Objective:   Physical Exam Vitals reviewed.  Constitutional:      Appearance: Normal appearance.  HENT:     Head: Normocephalic.  Neck:     Vascular: No carotid bruit.  Cardiovascular:  Rate and Rhythm: Normal rate and regular rhythm.     Pulses: Normal pulses.     Heart sounds: Murmur heard.    Pulmonary:     Effort: Pulmonary effort is normal.     Breath sounds: Normal breath sounds.  Musculoskeletal:     Right lower leg: No  edema.     Left lower leg: No edema.     Comments: NROM at waist.  No tenderness to palpation over spine  Lower ext strength 5/5.  Negative SLR, bilaterally.   Neurological:     General: No focal deficit present.     Mental Status: She is alert and oriented to person, place, and time.  Psychiatric:        Mood and Affect: Mood normal.       .. Results for orders placed or performed in visit on 12/11/20  POCT glycosylated hemoglobin (Hb A1C)  Result Value Ref Range   Hemoglobin A1C 6.9 (A) 4.0 - 5.6 %   HbA1c POC (<> result, manual entry)     HbA1c, POC (prediabetic range)     HbA1c, POC (controlled diabetic range)         Assessment & Plan:  [name] was seen today for diabetes.  Diagnoses and all orders for this visit:  Type 2 diabetes mellitus with complication, without long-term current use of insulin (HCC) -     POCT glycosylated hemoglobin (Hb A1C)  Hyperlipidemia LDL goal <70  Essential hypertension   A1C to goal.  Continue metformin. On ACE. BP to goal.  On STATIN.  Foot exam/eye exam UTD.  covid 2 vaccines done. Recommended booster.  Flu/pneumonia UTD.   Discussed low back pain. No red flag symptoms. No new injury.  Start [facility] PT on you tube. NSAIDs as needed. Icy hot, good posture, tens unit.  HO given.

## 2020-12-11 NOTE — Patient Instructions (Signed)

## 2020-12-12 DIAGNOSIS — M5136 Other intervertebral disc degeneration, lumbar region: Secondary | ICD-10-CM | POA: Insufficient documentation

## 2020-12-26 DIAGNOSIS — U071 COVID-19: Secondary | ICD-10-CM | POA: Diagnosis not present

## 2021-01-04 DIAGNOSIS — D0439 Carcinoma in situ of skin of other parts of face: Secondary | ICD-10-CM | POA: Diagnosis not present

## 2021-02-01 DIAGNOSIS — I10 Essential (primary) hypertension: Secondary | ICD-10-CM | POA: Diagnosis not present

## 2021-02-01 DIAGNOSIS — Z7984 Long term (current) use of oral hypoglycemic drugs: Secondary | ICD-10-CM | POA: Diagnosis not present

## 2021-02-01 DIAGNOSIS — G2581 Restless legs syndrome: Secondary | ICD-10-CM | POA: Diagnosis not present

## 2021-02-01 DIAGNOSIS — E785 Hyperlipidemia, unspecified: Secondary | ICD-10-CM | POA: Diagnosis not present

## 2021-02-01 DIAGNOSIS — G8929 Other chronic pain: Secondary | ICD-10-CM | POA: Diagnosis not present

## 2021-02-01 DIAGNOSIS — Z833 Family history of diabetes mellitus: Secondary | ICD-10-CM | POA: Diagnosis not present

## 2021-02-01 DIAGNOSIS — M1710 Unilateral primary osteoarthritis, unspecified knee: Secondary | ICD-10-CM | POA: Diagnosis not present

## 2021-02-01 DIAGNOSIS — Z791 Long term (current) use of non-steroidal anti-inflammatories (NSAID): Secondary | ICD-10-CM | POA: Diagnosis not present

## 2021-02-01 DIAGNOSIS — E119 Type 2 diabetes mellitus without complications: Secondary | ICD-10-CM | POA: Diagnosis not present

## 2021-02-01 DIAGNOSIS — D649 Anemia, unspecified: Secondary | ICD-10-CM | POA: Diagnosis not present

## 2021-03-13 ENCOUNTER — Other Ambulatory Visit: Payer: Self-pay

## 2021-03-13 ENCOUNTER — Ambulatory Visit: Payer: Medicare HMO

## 2021-03-13 ENCOUNTER — Ambulatory Visit (INDEPENDENT_AMBULATORY_CARE_PROVIDER_SITE_OTHER): Payer: Medicare HMO | Admitting: Physician Assistant

## 2021-03-13 ENCOUNTER — Encounter: Payer: Self-pay | Admitting: Physician Assistant

## 2021-03-13 ENCOUNTER — Ambulatory Visit (INDEPENDENT_AMBULATORY_CARE_PROVIDER_SITE_OTHER): Payer: Medicare HMO

## 2021-03-13 ENCOUNTER — Other Ambulatory Visit: Payer: Self-pay | Admitting: Physician Assistant

## 2021-03-13 VITALS — BP 134/69 | HR 70 | Temp 98.0°F | Ht 66.5 in | Wt 174.0 lb

## 2021-03-13 DIAGNOSIS — M25561 Pain in right knee: Secondary | ICD-10-CM | POA: Diagnosis not present

## 2021-03-13 DIAGNOSIS — G2581 Restless legs syndrome: Secondary | ICD-10-CM

## 2021-03-13 DIAGNOSIS — M79605 Pain in left leg: Secondary | ICD-10-CM

## 2021-03-13 DIAGNOSIS — G8929 Other chronic pain: Secondary | ICD-10-CM

## 2021-03-13 DIAGNOSIS — C44321 Squamous cell carcinoma of skin of nose: Secondary | ICD-10-CM

## 2021-03-13 DIAGNOSIS — M25562 Pain in left knee: Secondary | ICD-10-CM | POA: Diagnosis not present

## 2021-03-13 DIAGNOSIS — D099 Carcinoma in situ, unspecified: Secondary | ICD-10-CM

## 2021-03-13 DIAGNOSIS — M79604 Pain in right leg: Secondary | ICD-10-CM | POA: Diagnosis not present

## 2021-03-13 DIAGNOSIS — E118 Type 2 diabetes mellitus with unspecified complications: Secondary | ICD-10-CM

## 2021-03-13 LAB — POCT GLYCOSYLATED HEMOGLOBIN (HGB A1C): Hemoglobin A1C: 6.9 % — AB (ref 4.0–5.6)

## 2021-03-13 MED ORDER — METFORMIN HCL 1000 MG PO TABS: 1000.0000 mg | ORAL_TABLET | Freq: Two times a day (BID) | ORAL | 1 refills | Status: DC

## 2021-03-13 NOTE — Progress Notes (Signed)
Subjective:    Patient ID: Ann Griffith, female    DOB: 25-Jan-1951, 70 y.o.   MRN: [id number]  HPI Pt is a 70 yo female with T2DM, HLD, HTN, Raynauds syndrome, RLS who presents to the clinic for 3 month follow up.   Pt is not checking sugars. She is taking metformin daily. No open sores or wounds. No hypoglycemic events.   Dx of SCC and had resection of cancer located on left side of nose. She is wearing sunscreen now.   Pt continues to have problems with feet being cold off and on.   Pt is also have bilateral lateral leg pain. Feels different than RLS. Knees are achy as well. Last xrays of knees were 2020 with mild degenerative changes. Most of achiness is lateral leg and to the knee. Her legs hurting is keeping her up at night and making her more tired during the day.   .. Active Ambulatory Problems    Diagnosis Date Noted   Type 2 diabetes mellitus with complication, without long-term current use of insulin ([place]) 11/09/2007   SCHATZKI'S RING 11/09/2007   CHOLELITHIASIS, HX OF 11/09/2007   Diverticulosis 06/07/2013   Raynaud's syndrome 08/09/2013   BPPV (benign paroxysmal positional vertigo), right 11/02/2014   Microalbuminuria 11/02/2014   Hyperlipidemia LDL goal <70 11/03/2014   Seborrheic dermatitis 03/14/2015   Memory changes 05/05/2015   Insomnia 08/08/2015   Bilateral leg pain 08/08/2015   Shoulder bursitis 08/08/2015   Avitaminosis D 10/09/2012   Colon polyp 04/24/2011   Tennis elbow syndrome 03/29/2016   RLS (restless legs syndrome) 03/29/2016   Right shoulder pain 03/29/2016   Degenerative joint disease of right shoulder 04/01/2016   Cataract cortical, senile, left 06/04/2016   Elevated serum creatinine 12/10/2016   Tricompartment osteoarthritis of knees, bilateral 06/25/2017   Trigger finger, left ring finger 06/26/2017   Chronic pain of both knees 06/26/2017   Carpal tunnel syndrome, right 06/26/2017   Dry skin dermatitis 06/26/2017   Trochanteric  bursitis 06/27/2017   Fear of flying 09/28/2017   Orthostatic hypotension 12/16/2017   Syncope and collapse 12/16/2017   Mitral regurgitation 12/19/2017   Tricuspid regurgitation 12/19/2017   Raynaud's phenomenon without gangrene 02/03/2018   Concussion 12/08/2017   Essential hypertension 12/10/2017   Combined hyperlipidemia associated with type 2 diabetes mellitus ([place]) 04/24/2011   Itching 04/27/2018   Chronic pain of both ankles 04/27/2018   Bilateral carpal tunnel syndrome 04/27/2018   Statin intolerance 07/03/2018   Leg cramping 09/03/2019   Toe pain, right 12/01/2019   Degenerative disc disease, cervical 12/07/2019   Paronychia of great toe, left 02/07/2020   Myalgia 03/14/2020   Ingrown toenail of right foot 03/14/2020   Iron deficiency anemia 06/13/2020   Facet arthritis, degenerative, lumbar spine 06/28/2020   Acute left-sided low back pain with left-sided sciatica 06/28/2020   DDD (degenerative disc disease), lumbar 12/12/2020   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 03/13/2021   CKD (chronic kidney disease) stage 3, GFR 30-59 ml/min (HCC) 03/14/2021   Osteoarthritis of patellofemoral joints, bilateral 03/14/2021   Squamous cell carcinoma in situ 03/19/2021   Resolved Ambulatory Problems    Diagnosis Date Noted   Eustachian tube dysfunction 05/02/2014   Controlled type 2 diabetes mellitus without complication, without long-term current use of insulin ([place]) 06/26/2017   Flatulence 09/28/2017   Nausea 12/16/2017   Hematoma and contusion 12/16/2017   Past Medical History:  Diagnosis Date   DIABETES MELLITUS, TYPE II 11/09/2007   Hyperlipidemia  Review of Systems  All other systems reviewed and are negative.     Objective:   Physical Exam Vitals reviewed.  Constitutional:      Appearance: Normal appearance.  HENT:     Head: Normocephalic.  Cardiovascular:     Rate and Rhythm: Normal rate and regular rhythm.  Pulmonary:     Effort: Pulmonary effort is  normal.     Breath sounds: Normal breath sounds.  Musculoskeletal:     Right lower leg: No edema.     Left lower leg: No edema.     Comments: 2+ pedal pulses.  No tenderness to palpation over legs.  NROM bilateral lower extremities.   Skin:    Comments: Covered in age spots.   Neurological:     General: No focal deficit present.     Mental Status: She is alert and oriented to person, place, and time.  Psychiatric:        Mood and Affect: Mood normal.     .. Results for orders placed or performed in visit on 03/13/21  COMPLETE METABOLIC PANEL WITH GFR  Result Value Ref Range   Glucose, Bld 146 (H) 65 - 99 mg/dL   BUN 20 7 - 25 mg/dL   Creat 1.05 (H) 0.60 - 1.00 mg/dL   eGFR 57 (L) > OR = 60 mL/min/1.61m   BUN/Creatinine Ratio 19 6 - 22 (calc)   Sodium 137 135 - 146 mmol/L   Potassium 4.4 3.5 - 5.3 mmol/L   Chloride 104 98 - 110 mmol/L   CO2 25 20 - 32 mmol/L   Calcium 9.5 8.6 - 10.4 mg/dL   Total Protein 7.2 6.1 - 8.1 g/dL   Albumin 4.6 3.6 - 5.1 g/dL   Globulin 2.6 1.9 - 3.7 g/dL (calc)   AG Ratio 1.8 1.0 - 2.5 (calc)   Total Bilirubin 0.5 0.2 - 1.2 mg/dL   Alkaline phosphatase (APISO) 40 37 - 153 U/L   AST 18 10 - 35 U/L   ALT 18 6 - 29 U/L  CK  Result Value Ref Range   Total CK 76 29 - 143 U/L  Sed Rate (ESR)  Result Value Ref Range   Sed Rate 6 0 - 30 mm/h  CBC with Differential/Platelet  Result Value Ref Range   WBC 5.5 3.8 - 10.8 Thousand/uL   RBC 3.74 (L) 3.80 - 5.10 Million/uL   Hemoglobin 11.9 11.7 - 15.5 g/dL   HCT 36.7 35.0 - 45.0 %   MCV 98.1 80.0 - 100.0 fL   MCH 31.8 27.0 - 33.0 pg   MCHC 32.4 32.0 - 36.0 g/dL   RDW 12.3 11.0 - 15.0 %   Platelets 221 140 - 400 Thousand/uL   MPV 10.7 7.5 - 12.5 fL   Neutro Abs 3,190 1,500 - 7,800 cells/uL   Lymphs Abs 1,650 850 - 3,900 cells/uL   Absolute Monocytes 341 200 - 950 cells/uL   Eosinophils Absolute 281 15 - 500 cells/uL   Basophils Absolute 39 0 - 200 cells/uL   Neutrophils Relative % 58 %    Total Lymphocyte 30.0 %   Monocytes Relative 6.2 %   Eosinophils Relative 5.1 %   Basophils Relative 0.7 %  Fe+TIBC+Fer  Result Value Ref Range   Iron 112 45 - 160 mcg/dL   TIBC 328 250 - 450 mcg/dL (calc)   %SAT 34 16 - 45 % (calc)   Ferritin 66 16 - 288 ng/mL  Magnesium  Result Value Ref Range  Magnesium 1.9 1.5 - 2.5 mg/dL  POCT glycosylated hemoglobin (Hb A1C)  Result Value Ref Range   Hemoglobin A1C 6.9 (A) 4.0 - 5.6 %   HbA1c POC (<> result, manual entry)     HbA1c, POC (prediabetic range)     HbA1c, POC (controlled diabetic range)         Assessment & Plan:  [name] was seen today for diabetes, hypertension, hyperlipidemia and ddd lumbar.  Diagnoses and all orders for this visit:  Type 2 diabetes mellitus with complication, without long-term current use of insulin (HCC) -     metFORMIN (GLUCOPHAGE) 1000 MG tablet; Take 1 tablet (1,000 mg total) by mouth 2 (two) times daily with a meal. 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  Get bilateral knee xrays. Consider follow up with sports med, Dr. Darene Lamer.  Known restless legs. On requip. States this feels different.  Labs ordered.  Given some IT stretches to try as well.   A1c trending up.  Increase metformin to 1026m bid.  On ACE. BP to goal.   Pneumonia/covid UtD.  Need flu shot when available.

## 2021-03-13 NOTE — Patient Instructions (Addendum)
Stop crestor for 2 weeks.  Get knee xrays.  Increase metformin to '1000mg'$  twice a day.   Iliotibial Band Syndrome Rehab Ask your health care provider which exercises are safe for you. Do exercises exactly as told by your health care provider and adjust them as directed. It is normal to feel mild stretching, pulling, tightness, or discomfort as you do these exercises. Stop right away if you feel sudden pain or your pain gets significantly worse. Do not begin these exercises until told by your health care provider. Stretching and range-of-motion exercises These exercises warm up your muscles and joints and improve the movement andflexibility of your hip and pelvis. Quadriceps stretch, prone  Lie on your abdomen (prone position) on a firm surface, such as a bed or padded floor. Bend your left / right knee and reach back to hold your ankle or pant leg. If you cannot reach your ankle or pant leg, loop a belt around your foot and grab the belt instead. Gently pull your heel toward your buttocks. Your knee should not slide out to the side. You should feel a stretch in the front of your thigh and knee (quadriceps). Hold this position for __________ seconds. Repeat __________ times. Complete this exercise __________ times a day. Iliotibial band stretch An iliotibial band is a strong band of muscle tissue that runs from the outer side of your hip to the outer side of your thigh and knee. Lie on your side with your left / right leg in the top position. Bend both of your knees and grab your left / right ankle. Stretch out your bottom arm to help you balance. Slowly bring your top knee back so your thigh goes behind your trunk. Slowly lower your top leg toward the floor until you feel a gentle stretch on the outside of your left / right hip and thigh. If you do not feel a stretch and your knee will not fall farther, place the heel of your other foot on top of your knee and pull your knee down toward the floor  with your foot. Hold this position for __________ seconds. Repeat __________ times. Complete this exercise __________ times a day. Strengthening exercises These exercises build strength and endurance in your hip and pelvis. Enduranceis the ability to use your muscles for a long time, even after they get tired. Straight leg raises, side-lying This exercise strengthens the muscles that rotate the leg at the hip and move it away from your body (hip abductors). Lie on your side with your left / right leg in the top position. Lie so your head, shoulder, hip, and knee line up. You may bend your bottom knee to help you balance. Roll your hips slightly forward so your hips are stacked directly over each other and your left / right knee is facing forward. Tense the muscles in your outer thigh and lift your top leg 4-6 inches (10-15 cm). Hold this position for __________ seconds. Slowly lower your leg to return to the starting position. Let your muscles relax completely before doing another repetition. Repeat __________ times. Complete this exercise __________ times a day. Leg raises, prone This exercise strengthens the muscles that move the hips backward (hip extensors). Lie on your abdomen (prone position) on your bed or a firm surface. You can put a pillow under your hips if that is more comfortable for your lower back. Bend your left / right knee so your foot is straight up in the air. Squeeze your buttocks muscles  and lift your left / right thigh off the bed. Do not let your back arch. Tense your thigh muscle as hard as you can without increasing any knee pain. Hold this position for __________ seconds. Slowly lower your leg to return to the starting position and allow it to relax completely. Repeat __________ times. Complete this exercise __________ times a day. Hip hike Stand sideways on a bottom step. Stand on your left / right leg with your other foot unsupported next to the step. You can hold  on to a railing or wall for balance if needed. Keep your knees straight and your torso square. Then lift your left / right hip up toward the ceiling. Slowly let your left / right hip lower toward the floor, past the starting position. Your foot should get closer to the floor. Do not lean or bend your knees. Repeat __________ times. Complete this exercise __________ times a day. This information is not intended to replace advice given to you by your health care provider. Make sure you discuss any questions you have with your healthcare provider. Document Revised: 10/06/2019 Document Reviewed: 10/06/2019 Elsevier Patient Education  [employer].

## 2021-03-14 ENCOUNTER — Encounter: Payer: Self-pay | Admitting: Physician Assistant

## 2021-03-14 DIAGNOSIS — N183 Chronic kidney disease, stage 3 unspecified: Secondary | ICD-10-CM | POA: Insufficient documentation

## 2021-03-14 DIAGNOSIS — M170 Bilateral primary osteoarthritis of knee: Secondary | ICD-10-CM | POA: Insufficient documentation

## 2021-03-14 DIAGNOSIS — N1831 Chronic kidney disease, stage 3a: Secondary | ICD-10-CM | POA: Insufficient documentation

## 2021-03-14 LAB — COMPLETE METABOLIC PANEL WITH GFR
AG Ratio: 1.8 (calc) (ref 1.0–2.5)
ALT: 18 U/L (ref 6–29)
AST: 18 U/L (ref 10–35)
Albumin: 4.6 g/dL (ref 3.6–5.1)
Alkaline phosphatase (APISO): 40 U/L (ref 37–153)
BUN/Creatinine Ratio: 19 (calc) (ref 6–22)
BUN: 20 mg/dL (ref 7–25)
CO2: 25 mmol/L (ref 20–32)
Calcium: 9.5 mg/dL (ref 8.6–10.4)
Chloride: 104 mmol/L (ref 98–110)
Creat: 1.05 mg/dL — ABNORMAL HIGH (ref 0.60–1.00)
Globulin: 2.6 g/dL (calc) (ref 1.9–3.7)
Glucose, Bld: 146 mg/dL — ABNORMAL HIGH (ref 65–99)
Potassium: 4.4 mmol/L (ref 3.5–5.3)
Sodium: 137 mmol/L (ref 135–146)
Total Bilirubin: 0.5 mg/dL (ref 0.2–1.2)
Total Protein: 7.2 g/dL (ref 6.1–8.1)
eGFR: 57 mL/min/{1.73_m2} — ABNORMAL LOW (ref >=60)

## 2021-03-14 LAB — CBC WITH DIFFERENTIAL/PLATELET
Absolute Monocytes: 341 cells/uL (ref 200–950)
Basophils Absolute: 39 cells/uL (ref 0–200)
Basophils Relative: 0.7 %
Eosinophils Absolute: 281 cells/uL (ref 15–500)
Eosinophils Relative: 5.1 %
HCT: 36.7 % (ref 35.0–45.0)
Hemoglobin: 11.9 g/dL (ref 11.7–15.5)
Lymphs Abs: 1650 cells/uL (ref 850–3900)
MCH: 31.8 pg (ref 27.0–33.0)
MCHC: 32.4 g/dL (ref 32.0–36.0)
MCV: 98.1 fL (ref 80.0–100.0)
MPV: 10.7 fL (ref 7.5–12.5)
Monocytes Relative: 6.2 %
Neutro Abs: 3190 cells/uL (ref 1500–7800)
Neutrophils Relative %: 58 %
Platelets: 221 10*3/uL (ref 140–400)
RBC: 3.74 10*6/uL — ABNORMAL LOW (ref 3.80–5.10)
RDW: 12.3 % (ref 11.0–15.0)
Total Lymphocyte: 30 %
WBC: 5.5 10*3/uL (ref 3.8–10.8)

## 2021-03-14 LAB — IRON,TIBC AND FERRITIN PANEL
%SAT: 34 % (calc) (ref 16–45)
Ferritin: 66 ng/mL (ref 16–288)
Iron: 112 ug/dL (ref 45–160)
TIBC: 328 mcg/dL (calc) (ref 250–450)

## 2021-03-14 LAB — MAGNESIUM: Magnesium: 1.9 mg/dL (ref 1.5–2.5)

## 2021-03-14 LAB — SEDIMENTATION RATE: Sed Rate: 6 mm/h (ref 0–30)

## 2021-03-14 LAB — CK: Total CK: 76 U/L (ref 29–143)

## 2021-03-14 NOTE — Progress Notes (Signed)
Magnesium continues to be on low side of normal. How much are you taking now?  Iron looks really good.  Inflammation rate normal and no abnormal muscle breakdown.  Your chronic kidney disease is stable with GFR around 57.

## 2021-03-14 NOTE — Progress Notes (Signed)
You do have some osteophytes/osteoarthritis in both knees behind the kneecap right greater than left. I would follow up with Dr. Darene Lamer for a plan.

## 2021-03-19 ENCOUNTER — Encounter: Payer: Self-pay | Admitting: Physician Assistant

## 2021-03-19 DIAGNOSIS — D099 Carcinoma in situ, unspecified: Secondary | ICD-10-CM | POA: Insufficient documentation

## 2021-03-27 IMAGING — DX LEFT KNEE - COMPLETE 4+ VIEW
4 series · 4 of 4 positions shown · non-contrast
Comparison: None.

CLINICAL DATA: Bilateral knee pain

EXAM:
LEFT KNEE - COMPLETE 4+ VIEW; RIGHT KNEE - 1-2 VIEW

[knee lat]
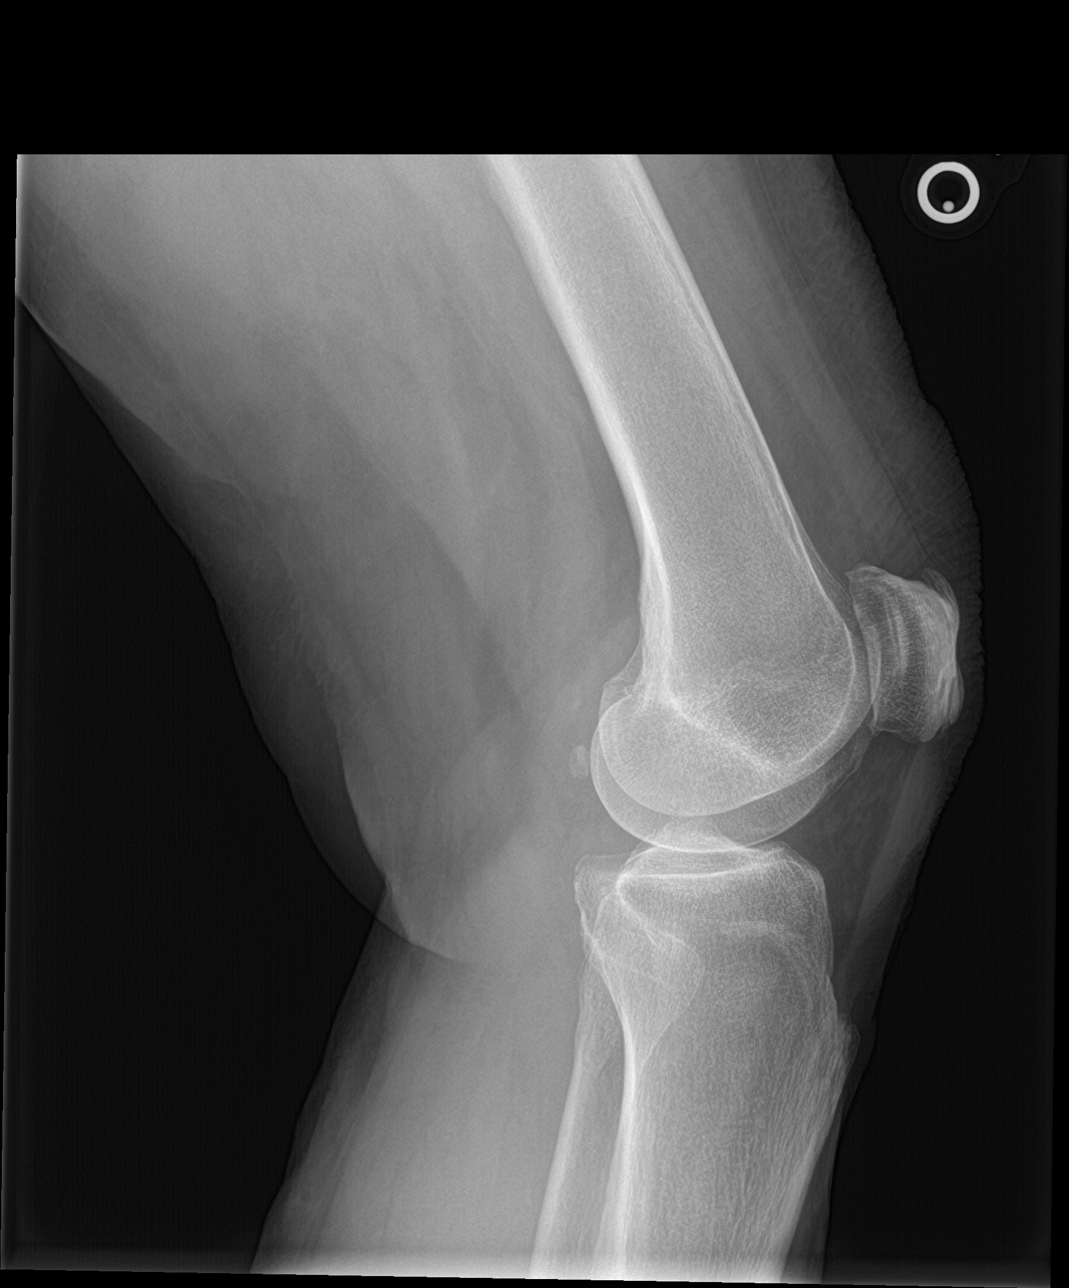

[knee sunrise]
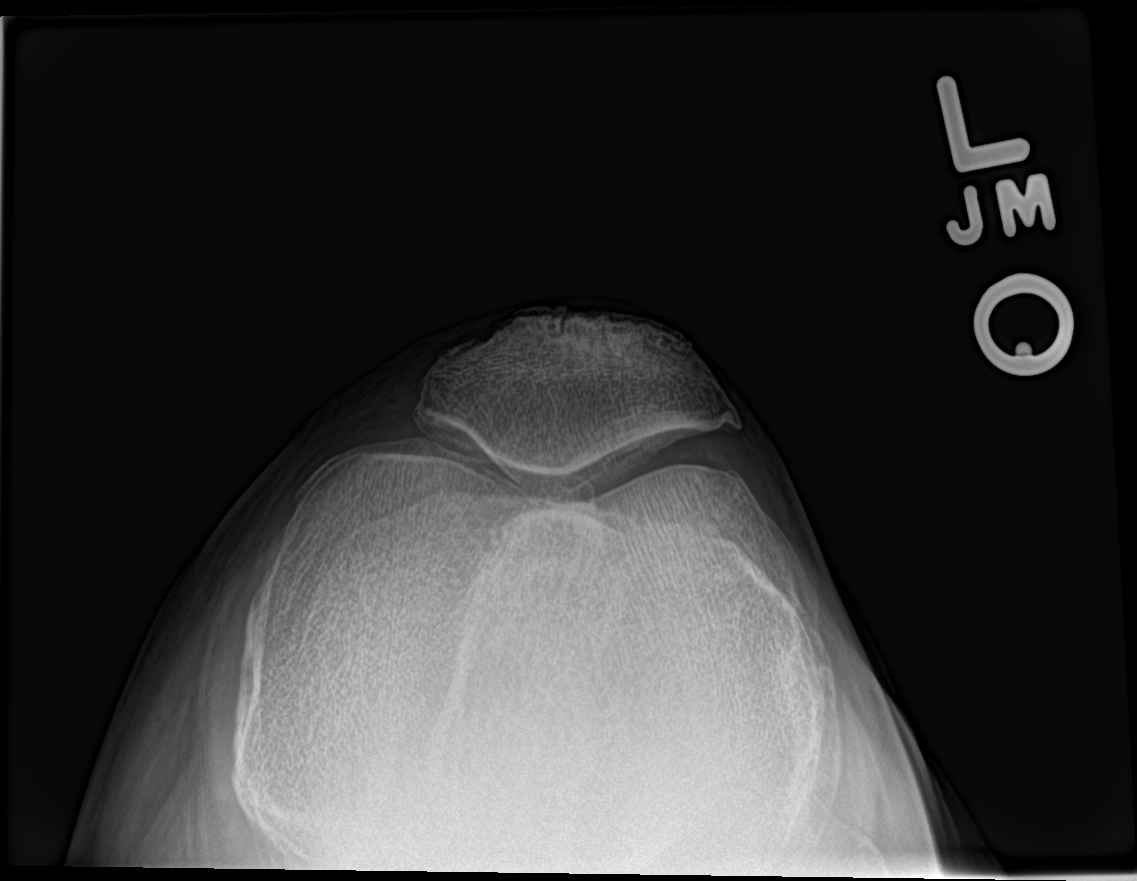

[knee ap bilat standing (1 of 2)]
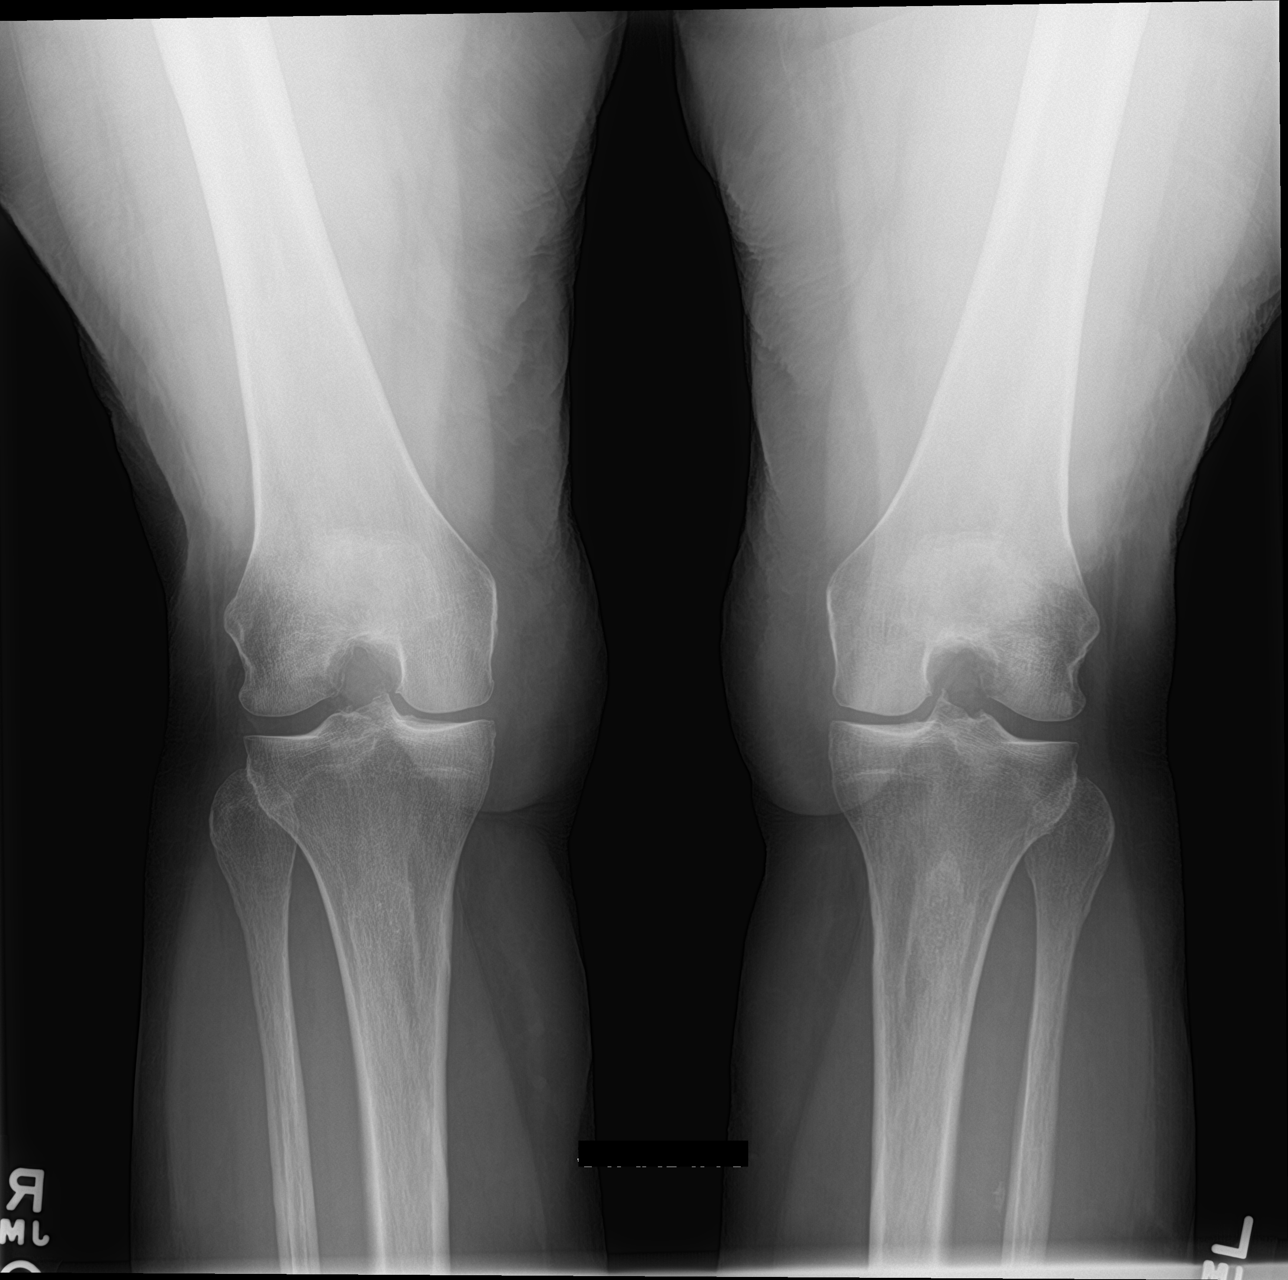

[knee ap bilat standing (2 of 2)]
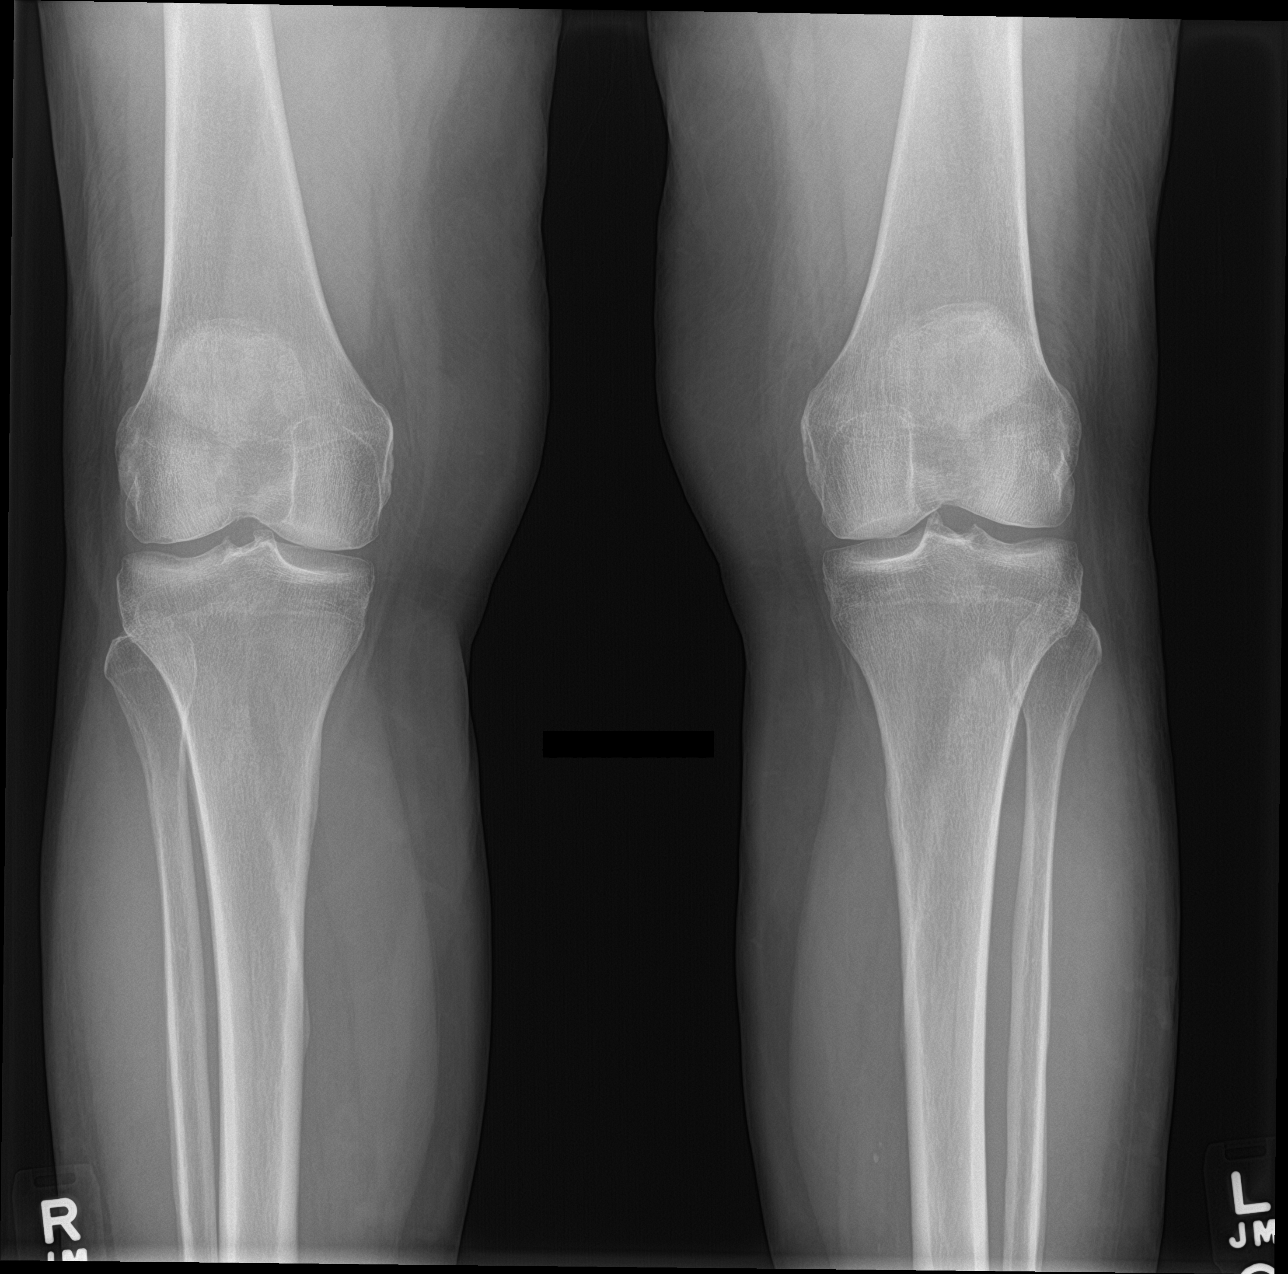

[4 of 4 positions shown; findings below may reference images not displayed]

FINDINGS: Bones: No fracture or dislocation. Normal bone mineralization. No
periostitis.

Joints: Normal alignment. No erosive changes. Joint spaces are
maintained. Tiny lateral patellofemoral compartment marginal
osteophytes.

Soft tissue: No soft tissue abnormality. No radiopaque foreign body.
No chondrocalcinosis.
IMPRESSION: No significant arthropathy of bilateral knees.

## 2021-03-30 ENCOUNTER — Ambulatory Visit (INDEPENDENT_AMBULATORY_CARE_PROVIDER_SITE_OTHER): Payer: Medicare HMO

## 2021-03-30 ENCOUNTER — Ambulatory Visit: Payer: Self-pay

## 2021-03-30 ENCOUNTER — Ambulatory Visit (INDEPENDENT_AMBULATORY_CARE_PROVIDER_SITE_OTHER): Payer: Medicare HMO | Admitting: Sports Medicine

## 2021-03-30 ENCOUNTER — Other Ambulatory Visit: Payer: Self-pay

## 2021-03-30 DIAGNOSIS — G8929 Other chronic pain: Secondary | ICD-10-CM

## 2021-03-30 DIAGNOSIS — M170 Bilateral primary osteoarthritis of knee: Secondary | ICD-10-CM

## 2021-03-30 DIAGNOSIS — M25511 Pain in right shoulder: Secondary | ICD-10-CM | POA: Diagnosis not present

## 2021-03-30 NOTE — Progress Notes (Signed)
    Procedures performed today:    Procedure: Real-time Ultrasound Guided injection of the right subacromial bursa Device: Samsung HS60  Verbal informed consent obtained.  Time-out conducted.  Noted no overlying erythema, induration, or other signs of local infection.  Skin prepped in a sterile fashion.  Local anesthesia: Topical Ethyl chloride.  With sterile technique and under real time ultrasound guidance: Noted intact rotator cuff, 1 cc kenalog 40, 1 cc lidocaine, 1 cc bupivacaine injected easily.   Completed without difficulty  Advised to call if fevers/chills, erythema, induration, drainage, or persistent bleeding.  Images permanently stored and available for review in PACS.  Impression: Technically successful ultrasound guided injection.  Procedure: Real-time Ultrasound Guided injection of the right knee Device: Samsung HS60  Verbal informed consent obtained.  Time-out conducted.  Noted no overlying erythema, induration, or other signs of local infection.  Skin prepped in a sterile fashion.  Local anesthesia: Topical Ethyl chloride.  With sterile technique and under real time ultrasound guidance: Noted trace effusion, 1 cc Kenalog 40, 2 cc lidocaine, 2 cc bupivacaine injected easily Completed without difficulty  Advised to call if fevers/chills, erythema, induration, drainage, or persistent bleeding.  Images permanently stored and available for review in PACS.  Impression: Technically successful ultrasound guided injection.  Independent interpretation of notes and tests performed by another provider:   None.  Brief History, Exam, Impression, and Recommendations:    Primary osteoarthritis of both knees Bilateral knee osteoarthritis on recent x-rays, medial joint line pain worse on the right, desiring injection on the right, oral NSAIDs ineffective. Right knee injection today, return to see me in a month.  Chronic right shoulder pain Suspect subacromial bursitis/rotator  cuff tendinitis. Patient desires injection, she had 1 in the past that seem to work well, performed today. Chronic process with exacerbation and pharmacologic intervention. I would like some shoulder x-rays as well.    ___________________________________________ Gwen Her. Dianah Field, M.D., ABFM., CAQSM. Primary Care and [place] Instructor of [employer] [facility] [employer]

## 2021-03-30 NOTE — Assessment & Plan Note (Signed)
Bilateral knee osteoarthritis on recent x-rays, medial joint line pain worse on the right, desiring injection on the right, oral NSAIDs ineffective. Right knee injection today, return to see me in a month.

## 2021-03-30 NOTE — Assessment & Plan Note (Signed)
Suspect subacromial bursitis/rotator cuff tendinitis. Patient desires injection, she had 1 in the past that seem to work well, performed today. Chronic process with exacerbation and pharmacologic intervention. I would like some shoulder x-rays as well.

## 2021-04-19 ENCOUNTER — Telehealth: Payer: Self-pay | Admitting: Lab

## 2021-04-19 NOTE — Progress Notes (Signed)
  Chronic Care Management   Outreach Note  04/19/2021 Name: Ann Griffith MRN: [id number] DOB: 1951/06/15  Referred by: Donella Stade, PA-C Reason for referral : Medication Management   An unsuccessful telephone outreach was attempted today. The patient was referred to the pharmacist for assistance with care management and care coordination.   Follow Up Plan:  [place]

## 2021-04-24 DIAGNOSIS — Z1231 Encounter for screening mammogram for malignant neoplasm of breast: Secondary | ICD-10-CM | POA: Diagnosis not present

## 2021-04-24 LAB — HM MAMMOGRAPHY

## 2021-05-01 ENCOUNTER — Telehealth: Payer: Self-pay | Admitting: Lab

## 2021-05-01 NOTE — Progress Notes (Signed)
  Chronic Care Management   Outreach Note  05/01/2021 Name: Ann Griffith MRN: [id number] DOB: July 15, 1951  Referred by: Donella Stade, PA-C Reason for referral : Medication Management   Third unsuccessful telephone outreach was attempted today. The patient was referred to the pharmacist for assistance with care management and care coordination.   Follow Up Plan:  [place]

## 2021-05-01 NOTE — Chronic Care Management (AMB) (Signed)
  Chronic Care Management   Note  05/01/2021 Name: Simaya Lumadue MRN: [id number] DOB: [birth year]  Irja Wheless is a 70 y.o. year old female who is a primary care patient of Donella Stade, PA-C. I reached out to [employer] by phone today in response to a referral sent by Ms. Camrin Safi's PCP, Breeback, Jade L, PA-C.   Ms. Deroo was given information about Chronic Care Management services today including:  CCM service includes personalized support from designated clinical staff supervised by her physician, including individualized plan of care and coordination with other care providers 24/7 contact phone numbers for assistance for urgent and routine care needs. Service will only be billed when office clinical staff spend 20 minutes or more in a month to coordinate care. Only one practitioner may furnish and bill the service in a calendar month. The patient may stop CCM services at any time (effective at the end of the month) by phone call to the office staff.   Patient agreed to services and verbal consent obtained.   Follow up plan: [place]

## 2021-05-10 DIAGNOSIS — Z780 Asymptomatic menopausal state: Secondary | ICD-10-CM | POA: Diagnosis not present

## 2021-05-10 LAB — HM DEXA SCAN

## 2021-05-23 ENCOUNTER — Encounter: Payer: Self-pay | Admitting: Physician Assistant

## 2021-06-13 ENCOUNTER — Telehealth: Payer: Self-pay

## 2021-06-13 ENCOUNTER — Ambulatory Visit (INDEPENDENT_AMBULATORY_CARE_PROVIDER_SITE_OTHER): Payer: Medicare HMO | Admitting: Physician Assistant

## 2021-06-13 ENCOUNTER — Encounter: Payer: Self-pay | Admitting: Physician Assistant

## 2021-06-13 VITALS — BP 130/72 | HR 76 | Ht 66.5 in | Wt 166.0 lb

## 2021-06-13 DIAGNOSIS — R195 Other fecal abnormalities: Secondary | ICD-10-CM

## 2021-06-13 DIAGNOSIS — K219 Gastro-esophageal reflux disease without esophagitis: Secondary | ICD-10-CM | POA: Diagnosis not present

## 2021-06-13 DIAGNOSIS — D509 Iron deficiency anemia, unspecified: Secondary | ICD-10-CM

## 2021-06-13 DIAGNOSIS — E118 Type 2 diabetes mellitus with unspecified complications: Secondary | ICD-10-CM | POA: Diagnosis not present

## 2021-06-13 DIAGNOSIS — I10 Essential (primary) hypertension: Secondary | ICD-10-CM

## 2021-06-13 DIAGNOSIS — E785 Hyperlipidemia, unspecified: Secondary | ICD-10-CM

## 2021-06-13 DIAGNOSIS — G2581 Restless legs syndrome: Secondary | ICD-10-CM

## 2021-06-13 DIAGNOSIS — M545 Low back pain, unspecified: Secondary | ICD-10-CM | POA: Diagnosis not present

## 2021-06-13 LAB — POCT GLYCOSYLATED HEMOGLOBIN (HGB A1C): Hemoglobin A1C: 6 % — AB (ref 4.0–5.6)

## 2021-06-13 MED ORDER — PANTOPRAZOLE SODIUM 40 MG PO TBEC: 40.0000 mg | DELAYED_RELEASE_TABLET | Freq: Every day | ORAL | 3 refills | Status: DC

## 2021-06-13 MED ORDER — FUSION 65-65-25-30 MG PO CAPS: 1.0000 | ORAL_CAPSULE | Freq: Every day | ORAL | 3 refills | Status: DC

## 2021-06-13 NOTE — Chronic Care Management (AMB) (Signed)
Chronic Care Management Pharmacy Assistant   Name: Ann Griffith  MRN: [id number] DOB: 07-21-1951  Ann Griffith is an 70 y.o. year old female who presents for his initial CCM visit with the clinical pharmacist.   Recent office visits:  03/30/21 Aundria Mems MD - Seen for osteoarthritis of both knees - Triamcinolone acetonide injection given in office  - Follow up in 4 weeks  03/13/21 Iran Planas PA - Seen for diabetes - Labs ordered - Stop crestor for 2 weeks - Increase metformin to 1000 mg twice daily - lliotibial band syndrome rehab - Follow up in 3 months    Recent consult visits:  02/01/21 [place] - Obstetrics and gynecology - Seen for diabetes - No notes available  01/04/21 [facility] [place] - Dermatology - Seen for Carcinoma in situ of skin of other parts of face - No notes available   Hospital visits:  None in previous 6 months  Medications: Outpatient Encounter Medications as of 06/13/2021  Medication Sig   amLODipine (NORVASC) 2.5 MG tablet Take 1-2 tablets (2.5-5 mg total) by mouth daily.   Cyanocobalamin (VITAMIN B-12 PO) Take 5,000 mcg by mouth every other day.    diclofenac (VOLTAREN) 75 MG EC tablet Take 1 tablet (75 mg total) by mouth 2 (two) times daily.   diclofenac Sodium (VOLTAREN) 1 % GEL APPLY 4 GRAMS 4 TIMES DAILY TOPICALLY. TO AFFECTED JOINT.   ferrous sulfate 325 (65 FE) MG EC tablet TAKE 1 TABLET BY MOUTH EVERY DAY WITH BREAKFAST   fish oil-omega-3 fatty acids 1000 MG capsule Take 1 g by mouth daily.   lisinopril (ZESTRIL) 2.5 MG tablet Take 1 tablet (2.5 mg total) by mouth daily.   MAGNESIUM PO Take by mouth.   metFORMIN (GLUCOPHAGE) 1000 MG tablet Take 1 tablet (1,000 mg total) by mouth 2 (two) times daily with a meal.   rOPINIRole (REQUIP) 0.5 MG tablet Take 1 tablet (0.5 mg total) by mouth 3 (three) times daily.   rosuvastatin (CRESTOR) 20 MG tablet Take 1 tablet (20 mg total) by mouth daily.   triamcinolone cream (KENALOG) 0.1 %  Apply 1 application topically 2 (two) times daily.   TURMERIC PO Take by mouth.   VITAMIN D PO Take 5,000 Int'l Units by mouth.   No facility-administered encounter medications on file as of 06/13/2021.    Care Gaps: COVID-19 Vaccine (Dose 4 - Booster for Pfizer series)  Overdue since 06/23/2020 INFLUENZA VACCINE Overdue since 03/12/2021  amLODipine (NORVASC) 2.5 MG tablet - Last filled 09/05/2020 90 DS  Cyanocobalamin (VITAMIN B-12 PO) - Last filled 08/04/15 diclofenac (VOLTAREN) 75 MG EC tablet - Last filled 04/08/2021 90 DS  diclofenac Sodium (VOLTAREN) 1 % GEL - Last filled 10/31/2018 22 DS  ferrous sulfate 325 (65 FE) MG EC tablet - Last filled 05/24/2021 90 DS  fish oil-omega-3 fatty acids 1000 MG capsule - Last filled 03/30/12 lisinopril (ZESTRIL) 2.5 MG tablet - Last filled 05/24/2021 90 DS  MAGNESIUM PO - Last filled 08/16/2019 metFORMIN (GLUCOPHAGE) 1000 MG tablet - Last filled 06/09/2021 90 DS  rOPINIRole (REQUIP) 0.5 MG tablet - Last filled 03/30/2021 90 DS  rosuvastatin (CRESTOR) 20 MG tablet - Last filled 04/27/2021 90 DS  triamcinolone cream (KENALOG) 0.1 % - Last filled 11/22/2020 19 DS  TURMERIC PO - Last filled 08/16/2019  VITAMIN D PO - Last filled 03/27/20   Star Rating Drugs: lisinopril (ZESTRIL) 2.5 MG tablet - Last filled 05/24/2021 90 DS  metFORMIN (GLUCOPHAGE) 1000 MG tablet -  Last filled 06/09/2021 90 DS  rosuvastatin (CRESTOR) 20 MG tablet - Last filled 04/27/2021 90 DS    Andee Poles, CMA

## 2021-06-13 NOTE — Progress Notes (Signed)
Subjective:    Patient ID: Ann Griffith, female    DOB: Nov 18, 1950, 70 y.o.   MRN: [id number]  HPI Patient is a 70 year old female with type 2 diabetes, hypertension, hyperlipidemia, restless leg, iron deficiency who presents to the clinic for 17-month follow-up.  Overall patient is doing really well.  She is not checking her sugars.  She denies any hypoglycemic events.  She denies any open sores or wounds.  She denies any chest pain, palpitations, headaches or vision changes.  She is having some stool consistency changes.  They are more dark and sticky. Last colonoscopy normal and 2020 per patient.  She is on iron supplementation. She has been exercising more.  She has lost 8 pounds.    .. Active Ambulatory Problems    Diagnosis Date Noted   Type 2 diabetes mellitus with complication, without long-term current use of insulin ([place]) 11/09/2007   SCHATZKI'S RING 11/09/2007   CHOLELITHIASIS, HX OF 11/09/2007   Diverticulosis 06/07/2013   Raynaud's syndrome 08/09/2013   BPPV (benign paroxysmal positional vertigo), right 11/02/2014   Microalbuminuria 11/02/2014   Hyperlipidemia LDL goal <70 11/03/2014   Seborrheic dermatitis 03/14/2015   Memory changes 05/05/2015   Insomnia 08/08/2015   Bilateral leg pain 08/08/2015   Shoulder bursitis 08/08/2015   Avitaminosis D 10/09/2012   Colon polyp 04/24/2011   Tennis elbow syndrome 03/29/2016   RLS (restless legs syndrome) 03/29/2016   Chronic right shoulder pain 04/01/2016   Cataract cortical, senile, left 06/04/2016   Elevated serum creatinine 12/10/2016   Trigger finger, left ring finger 06/26/2017   Primary osteoarthritis of both knees 06/26/2017   Carpal tunnel syndrome, right 06/26/2017   Dry skin dermatitis 06/26/2017   Trochanteric bursitis 06/27/2017   Fear of flying 09/28/2017   Orthostatic hypotension 12/16/2017   Syncope and collapse 12/16/2017   Mitral regurgitation 12/19/2017   Tricuspid regurgitation 12/19/2017    Raynaud's phenomenon without gangrene 02/03/2018   Concussion 12/08/2017   Essential hypertension 12/10/2017   Combined hyperlipidemia associated with type 2 diabetes mellitus ([place]) 04/24/2011   Itching 04/27/2018   Chronic pain of both ankles 04/27/2018   Bilateral carpal tunnel syndrome 04/27/2018   Statin intolerance 07/03/2018   Leg cramping 09/03/2019   Toe pain, right 12/01/2019   Degenerative disc disease, cervical 12/07/2019   Paronychia of great toe, left 02/07/2020   Myalgia 03/14/2020   Ingrown toenail of right foot 03/14/2020   Iron deficiency anemia 06/13/2020   Facet arthritis, degenerative, lumbar spine 06/28/2020   Acute left-sided low back pain with left-sided sciatica 06/28/2020   DDD (degenerative disc disease), lumbar 12/12/2020   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 03/13/2021   CKD (chronic kidney disease) stage 3, GFR 30-59 ml/min (HCC) 03/14/2021   Squamous cell carcinoma in situ 03/19/2021   Gastroesophageal reflux disease without esophagitis 06/13/2021   Change in consistency of stool 06/13/2021   Resolved Ambulatory Problems    Diagnosis Date Noted   Eustachian tube dysfunction 05/02/2014   Right shoulder pain 03/29/2016   Tricompartment osteoarthritis of knees, bilateral 06/25/2017   Controlled type 2 diabetes mellitus without complication, without long-term current use of insulin ([place]) 06/26/2017   Flatulence 09/28/2017   Nausea 12/16/2017   Hematoma and contusion 12/16/2017   Osteoarthritis of patellofemoral joints, bilateral 03/14/2021   Past Medical History:  Diagnosis Date   DIABETES MELLITUS, TYPE II 11/09/2007   Hyperlipidemia      Review of Systems See HPI.     Objective:   Physical Exam  Vitals reviewed.  Constitutional:      Appearance: Normal appearance.  HENT:     Head: Normocephalic.  Cardiovascular:     Rate and Rhythm: Normal rate and regular rhythm.     Pulses: Normal pulses.     Heart sounds: Normal heart sounds.   Pulmonary:     Effort: Pulmonary effort is normal.     Breath sounds: Normal breath sounds.  Neurological:     General: No focal deficit present.     Mental Status: She is alert and oriented to person, place, and time.  Psychiatric:        Mood and Affect: Mood normal.      .. Results for orders placed or performed in visit on 06/13/21  POCT glycosylated hemoglobin (Hb A1C)  Result Value Ref Range   Hemoglobin A1C 6.0 (A) 4.0 - 5.6 %   HbA1c POC (<> result, manual entry)     HbA1c, POC (prediabetic range)     HbA1c, POC (controlled diabetic range)         Assessment & Plan:  [name] was seen today for diabetes.  Diagnoses and all orders for this visit:  Type 2 diabetes mellitus with complication, without long-term current use of insulin (HCC) -     POCT glycosylated hemoglobin (Hb A1C)  RLS (restless legs syndrome) -     Fe Fum-Fe Poly-Vit C-Lactobac (FUSION) 65-65-25-30 MG CAPS; Take 1 tablet by mouth daily.  Hyperlipidemia LDL goal <70  Essential hypertension  Gastroesophageal reflux disease without esophagitis -     pantoprazole (PROTONIX) 40 MG tablet; Take 1 tablet (40 mg total) by mouth daily.  Change in consistency of stool  Iron deficiency anemia, unspecified iron deficiency anemia type -     Fe Fum-Fe Poly-Vit C-Lactobac (FUSION) 65-65-25-30 MG CAPS; Take 1 tablet by mouth daily.   A1C looks great.  Continue same medications.  On ACE.  On STatin.  Covid vaccine x3.  Flu shot given today.  Pneumonia shot UTD.  Follow up in 3 months.   Stool changes likely due to iron supplement.  Try fusion plus or add probiotic to ferrous sulfate.

## 2021-06-14 ENCOUNTER — Ambulatory Visit (INDEPENDENT_AMBULATORY_CARE_PROVIDER_SITE_OTHER): Payer: Medicare HMO | Admitting: Pharmacist

## 2021-06-14 ENCOUNTER — Other Ambulatory Visit: Payer: Self-pay

## 2021-06-14 DIAGNOSIS — I10 Essential (primary) hypertension: Secondary | ICD-10-CM

## 2021-06-14 DIAGNOSIS — E118 Type 2 diabetes mellitus with unspecified complications: Secondary | ICD-10-CM

## 2021-06-14 DIAGNOSIS — E785 Hyperlipidemia, unspecified: Secondary | ICD-10-CM

## 2021-06-14 NOTE — Patient Instructions (Signed)
Visit Information   PATIENT GOALS/PLAN OF CARE:  Care Plan : Medication Management  Updates made by ,  J, RPH since 06/14/2021 12:00 AM     Problem: DM, HTN, HLD      Long-Range Goal: Disease Progression Prevention   Start Date: 06/14/2021  This Visit's Progress: On track  Priority: High  Note:   Current Barriers:  None at present  Pharmacist Clinical Goal(s):  Over the next 180 days, patient will maintain control of chronic conditions as evidenced by medication fill history, lab values, and vital signs  through collaboration with PharmD and provider.   Interventions: 1:1 collaboration with Breeback, Jade L, PA-C regarding development and update of comprehensive plan of care as evidenced by provider attestation and co-signature Inter-disciplinary care team collaboration (see longitudinal plan of care) Comprehensive medication review performed; medication list updated in electronic medical record  Diabetes:  Controlled; current treatment:metformin 1g BID; a1c 6  Current glucose readings: not currently checking  Denies hypoglycemic/hyperglycemic symptoms  Current meal patterns: breakfast: donut or hard boiled egg + bacon, one biscuit/gravy; lunch: hot dog, 1/2 sandwich, ; dinner: chicken, steak, meatloaf, pork chops, + vegetable or salad; snacks: cookies, cake, popcorn, icecream; drinks: diet coke, sparkling water  Current exercise: goes to gym 3 days/week and walks 3 miles  Recommended continue current regimen,  Hypertension:  Controlled; current treatment:amlodipine 2.5mg daily, lisinopril 2.5mg daily;   Current home readings: not currently checking  Denies hypotensive/hypertensive symptoms  Recommended continue current regimen Hyperlipidemia:  Controlled; current treatment:rosuvastatin 20mg daily; LDL 79  Recommended continue current regimen  Patient Goals/Self-Care Activities Over the next 180 days, patient will:  take medications as prescribed  Follow Up  Plan: Telephone follow up appointment with care management team member scheduled for:  6-8 months      Consent to CCM Services: Ms. Mareno was given information about Chronic Care Management services including:  CCM service includes personalized support from designated clinical staff supervised by her physician, including individualized plan of care and coordination with other care providers 24/7 contact phone numbers for assistance for urgent and routine care needs. Service will only be billed when office clinical staff spend 20 minutes or more in a month to coordinate care. Only one practitioner may furnish and bill the service in a calendar month. The patient may stop CCM services at any time (effective at the end of the month) by phone call to the office staff. The patient will be responsible for cost sharing (co-pay) of up to 20% of the service fee (after annual deductible is met).  Patient agreed to services and verbal consent obtained.   Patient verbalizes understanding of instructions provided today and agrees to view in MyChart.   Telephone follow up appointment with care management team member scheduled for: 6-8 months   J       

## 2021-06-14 NOTE — Progress Notes (Signed)
Chronic Care Management Pharmacy Note  06/14/2021 Name:  Ann Griffith MRN:  [id number] DOB:  08/22/1950  Summary: addressed DM, HTN, HLD  Recommendations/Changes made from today's visit: none  Plan: f/u with pharmacist in 6-8 months  Subjective: Ann Griffith is an 70 y.o. year old female who is a primary patient of Donella Stade, PA-C.  The CCM team was consulted for assistance with disease management and care coordination needs.    Engaged with patient by telephone for initial visit in response to provider referral for pharmacy case management and/or care coordination services.   Consent to Services:  The patient was given information about Chronic Care Management services, agreed to services, and gave verbal consent prior to initiation of services.  Please see initial visit note for detailed documentation.   Patient Care Team: Lavada Mesi as PCP - General (Family Medicine) Megan Salon, MD as Consulting Physician (Gynecology) Darius Bump, [facility] as Pharmacist (Pharmacist)  Recent office visits:  03/30/21 Aundria Mems MD - Seen for osteoarthritis of both knees - Triamcinolone acetonide injection given in office  - Follow up in 4 weeks  03/13/21 Iran Planas PA - Seen for diabetes - Labs ordered - Stop crestor for 2 weeks - Increase metformin to 1000 mg twice daily - lliotibial band syndrome rehab - Follow up in 3 months      Recent consult visits:  02/01/21 [place] - Obstetrics and gynecology - Seen for diabetes - No notes available  01/04/21 [facility] - Dermatology - Seen for Carcinoma in situ of skin of other parts of face - No notes available    Hospital visits:  None in previous 6 months  Objective:  Lab Results  Component Value Date   CREATININE 1.05 (H) 03/13/2021   CREATININE 0.97 (H) 09/14/2020   CREATININE 1.06 (H) 09/01/2019    Lab Results  Component Value Date   HGBA1C 6.0 (A) 06/13/2021   Last diabetic Eye  exam:  Lab Results  Component Value Date/Time   HMDIABEYEEXA No Retinopathy 09/14/2020 12:00 AM    Last diabetic Foot exam: No results found for: HMDIABFOOTEX      Component Value Date/Time   CHOL 173 09/14/2020 0807   TRIG 67 09/14/2020 0807   HDL 80 09/14/2020 0807   CHOLHDL 2.2 09/14/2020 0807   VLDL 12 10/30/2015 0805   LDLCALC 79 09/14/2020 0807    Hepatic Function Latest Ref Rng & Units 03/13/2021 09/14/2020 09/01/2019  Total Protein 6.1 - 8.1 g/dL 7.2 6.8 6.9  Albumin 3.6 - 5.1 g/dL - - -  AST 10 - 35 U/L 18 17 19   ALT 6 - 29 U/L 18 15 23   Alk Phosphatase 33 - 130 U/L - - -  Total Bilirubin 0.2 - 1.2 mg/dL 0.5 0.3 0.4    Lab Results  Component Value Date/Time   TSH 2.98 09/14/2020 08:07 AM   TSH 2.24 09/01/2019 09:24 AM    CBC Latest Ref Rng & Units 03/13/2021 09/14/2020 09/01/2019  WBC 3.8 - 10.8 Thousand/uL 5.5 5.0 5.4  Hemoglobin 11.7 - 15.5 g/dL 11.9 11.4(L) 11.3(L)  Hematocrit 35.0 - 45.0 % 36.7 33.1(L) 33.4(L)  Platelets 140 - 400 Thousand/uL 221 236 207    Lab Results  Component Value Date/Time   VD25OH 29 (L) 11/02/2014 10:42 AM   VD25OH 33 04/23/2013 02:19 PM    Clinical ASCVD:  The 10-year ASCVD risk score (Arnett DK, et al., 2019) is: 23.9%   Values used to  calculate the score:     Age: 83 years     Sex: Female     Is Non-Hispanic African American: No     Diabetic: Yes     Tobacco smoker: No     Systolic Blood Pressure: 767 mmHg     Is BP treated: Yes     HDL Cholesterol: 80 mg/dL     Total Cholesterol: 173 mg/dL    Social History   Tobacco Use  Smoking Status Never  Smokeless Tobacco Never   BP Readings from Last 3 Encounters:  06/13/21 130/72  03/13/21 134/69  12/11/20 139/67   Pulse Readings from Last 3 Encounters:  06/13/21 76  03/13/21 70  12/11/20 67   Wt Readings from Last 3 Encounters:  06/13/21 166 lb (75.3 kg)  03/13/21 174 lb (78.9 kg)  12/11/20 175 lb (79.4 kg)    Assessment: Review of patient past medical history,  allergies, medications, health status, including review of consultants reports, laboratory and other test data, was performed as part of comprehensive evaluation and provision of chronic care management services.   SDOH:  (Social Determinants of Health) assessments and interventions performed:    CCM Care Plan  Allergies  Allergen Reactions   Amoxicillin Swelling   Gabapentin     dizzy   Atorvastatin Other (See Comments)    Memory loss    Medications Reviewed Today     Reviewed by Lavada Mesi (Physician Assistant) on 03/19/21 at [place] List Status: <None>   Medication Order Taking? Sig Documenting Provider Last Dose Status Informant  amLODipine (NORVASC) 2.5 MG tablet [id number] No Take 1-2 tablets (2.5-5 mg total) by mouth daily. Donella Stade, PA-C Taking Active   Cyanocobalamin (VITAMIN B-12 PO) [id number] No Take 5,000 mcg by mouth every other day.  [provider] Taking Active   diclofenac (VOLTAREN) 75 MG EC tablet [id number] No Take 1 tablet (75 mg total) by mouth 2 (two) times daily. Donella Stade, PA-C Taking Active   diclofenac Sodium (VOLTAREN) 1 % GEL [id number] No APPLY 4 GRAMS 4 TIMES DAILY TOPICALLY. TO AFFECTED JOINT. Donella Stade, PA-C Taking Active   ferrous sulfate 325 (65 FE) MG EC tablet [id number] No TAKE 1 TABLET BY MOUTH EVERY DAY WITH BREAKFAST Breeback, Jade L, PA-C Taking Active   fish oil-omega-3 fatty acids 1000 MG capsule [id number] No Take 1 g by mouth daily. [provider] Taking Active   lisinopril (ZESTRIL) 2.5 MG tablet [id number] No Take 1 tablet (2.5 mg total) by mouth daily. Lavada Mesi Taking Active   MAGNESIUM PO [id number] No Take by mouth. [provider] Taking Active   metFORMIN (GLUCOPHAGE) 1000 MG tablet [id number] Yes Take 1 tablet (1,000 mg total) by mouth 2 (two) times daily with a meal. Breeback, Jade L, PA-C  Active   Discontinued 03/13/21 0843   rOPINIRole (REQUIP) 0.5 MG tablet  [id number] No Take 1 tablet (0.5 mg total) by mouth 3 (three) times daily. Donella Stade, PA-C Taking Active   rosuvastatin (CRESTOR) 20 MG tablet [id number] No Take 1 tablet (20 mg total) by mouth daily. Donella Stade, PA-C Taking Active   triamcinolone cream (KENALOG) 0.1 % [id number] No Apply 1 application topically 2 (two) times daily. Lavada Mesi Taking Active   TURMERIC PO [id number] No Take by mouth. [provider] Taking Active   VITAMIN D PO [id number] No Take 5,000 Int'l Units by mouth. 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 03/13/2021   DDD (degenerative disc disease), lumbar 12/12/2020   Facet arthritis, degenerative, lumbar spine 06/28/2020   Acute left-sided low back pain with left-sided sciatica 06/28/2020   Iron deficiency anemia 06/13/2020   Myalgia 03/14/2020   Ingrown toenail of right foot 03/14/2020   Paronychia of great toe, left 02/07/2020   Degenerative disc disease, cervical 12/07/2019   Toe pain, right 12/01/2019   Leg cramping 09/03/2019   Statin intolerance 07/03/2018   Itching 04/27/2018   Chronic pain of both ankles 04/27/2018   Bilateral carpal tunnel syndrome 04/27/2018   Raynaud's phenomenon without gangrene 02/03/2018   Mitral regurgitation 12/19/2017   Tricuspid regurgitation 12/19/2017   Orthostatic hypotension 12/16/2017   Syncope and collapse 12/16/2017   Essential hypertension 12/10/2017   Concussion 12/08/2017   Fear of flying 09/28/2017   Trochanteric bursitis 06/27/2017   Trigger finger, left ring finger 06/26/2017   Primary osteoarthritis of both knees 06/26/2017   Carpal tunnel  syndrome, right 06/26/2017   Dry skin dermatitis 06/26/2017   Elevated serum creatinine 12/10/2016   Cataract cortical, senile, left 06/04/2016   Chronic right shoulder pain 04/01/2016   Tennis elbow syndrome 03/29/2016   RLS (restless legs syndrome) 03/29/2016   Insomnia 08/08/2015   Bilateral leg pain 08/08/2015   Shoulder bursitis 08/08/2015   Memory changes 05/05/2015   Seborrheic dermatitis 03/14/2015   Hyperlipidemia LDL goal <70 11/03/2014   BPPV (benign paroxysmal positional vertigo), right 11/02/2014   Microalbuminuria 11/02/2014   Raynaud's syndrome 08/09/2013   Diverticulosis 06/07/2013   Avitaminosis D 10/09/2012   Colon polyp 04/24/2011   Combined hyperlipidemia associated with type 2 diabetes mellitus ([place]) 04/24/2011   Type 2 diabetes mellitus with complication, without long-term current use of insulin ([place]) 11/09/2007   SCHATZKI'S RING 11/09/2007   CHOLELITHIASIS, HX OF 11/09/2007    Immunization History  Administered Date(s) Administered   Fluad Quad(high Dose 65+) 05/31/2019, 06/13/2020   Influenza,inj,Quad PF,6+ Mos 05/02/2014, 05/05/2015, 06/04/2016, 06/24/2017, 04/27/2018   PFIZER(Purple Top)SARS-COV-2 Vaccination 10/05/2019, 10/26/2019, 04/28/2020   Pneumococcal Conjugate-13 05/05/2015   Pneumococcal Polysaccharide-23 06/04/2016   Tdap 12/21/2009, 10/14/2012   Zoster Recombinat (Shingrix) 11/17/2019, 03/28/2020    Conditions to be addressed/monitored: HTN, HLD, and DMII  There are no care plans that you recently modified to display for this patient.   Medication Assistance: None required.  Patient affirms current coverage meets needs.  Patient's preferred pharmacy is:  [id number] [place] [place] [address] [place] [address]Phone: [phone]: [phone] [id number] [address] [place] [id number] [id number] [id number]: [phone]:  [phone] pill box? Yes Pt endorses 100% compliance  Follow Up:  Patient agrees to Care Plan and Follow-up.  Plan: Telephone follow up appointment with care management team member scheduled for:  6-8 months  KeLarinda ButteryPharmD Clinical Pharmacist [phone]

## 2021-06-15 ENCOUNTER — Encounter: Payer: Self-pay | Admitting: Physician Assistant

## 2021-06-20 DIAGNOSIS — M545 Low back pain, unspecified: Secondary | ICD-10-CM | POA: Diagnosis not present

## 2021-06-26 ENCOUNTER — Other Ambulatory Visit (HOSPITAL_COMMUNITY)
Admission: RE | Admit: 2021-06-26 | Discharge: 2021-06-26 | Disposition: A | Discharge status: Home / Self Care | Payer: Medicare HMO | Source: Ambulatory Visit | Attending: Obstetrics & Gynecology | Admitting: Obstetrics & Gynecology

## 2021-06-26 ENCOUNTER — Other Ambulatory Visit: Payer: Self-pay

## 2021-06-26 ENCOUNTER — Ambulatory Visit (INDEPENDENT_AMBULATORY_CARE_PROVIDER_SITE_OTHER): Payer: Medicare HMO

## 2021-06-26 DIAGNOSIS — L299 Pruritus, unspecified: Secondary | ICD-10-CM | POA: Insufficient documentation

## 2021-06-26 DIAGNOSIS — R350 Frequency of micturition: Secondary | ICD-10-CM

## 2021-06-26 LAB — POCT URINALYSIS DIPSTICK
Glucose, UA: NEGATIVE
Ketones, UA: 15
Nitrite, UA: NEGATIVE
Protein, UA: POSITIVE — AB
Spec Grav, UA: 1.025 (ref 1.010–1.025)
Urobilinogen, UA: 0.2 E.U./dL
pH, UA: 5.5 (ref 5.0–8.0)

## 2021-06-26 NOTE — Progress Notes (Signed)
Patient came in today with complaints of frequent urination, occassional burning and itching. Patient states she is not sure what is going on but she also feels a little irritated in the vaginal area. Urine was dipped. Aptima swab was also obtained. Requested a urine sample after obtaining results from urine dipstick. Patient did not give enough specimen to send for a culture. She will come back tomorrow to leave that sample. tbw

## 2021-06-27 ENCOUNTER — Ambulatory Visit (HOSPITAL_BASED_OUTPATIENT_CLINIC_OR_DEPARTMENT_OTHER): Payer: Medicare HMO

## 2021-06-27 DIAGNOSIS — M545 Low back pain, unspecified: Secondary | ICD-10-CM | POA: Diagnosis not present

## 2021-06-27 DIAGNOSIS — R350 Frequency of micturition: Secondary | ICD-10-CM | POA: Diagnosis not present

## 2021-06-27 LAB — CERVICOVAGINAL ANCILLARY ONLY
Bacterial Vaginitis (gardnerella): NEGATIVE
Candida Glabrata: NEGATIVE
Candida Vaginitis: NEGATIVE
Comment: NEGATIVE
Comment: NEGATIVE
Comment: NEGATIVE

## 2021-06-27 NOTE — Progress Notes (Signed)
Patient came in on 06/26/2021 and was unable to give an efficient sample to be sent off to the lab for a urine culture. Patient came back today and gave a sample. Urine culture ordered, sample sent to [place]. tbw

## 2021-06-28 ENCOUNTER — Other Ambulatory Visit (HOSPITAL_BASED_OUTPATIENT_CLINIC_OR_DEPARTMENT_OTHER): Payer: Self-pay | Admitting: *Deleted

## 2021-06-28 DIAGNOSIS — K130 Diseases of lips: Secondary | ICD-10-CM | POA: Diagnosis not present

## 2021-06-28 DIAGNOSIS — D485 Neoplasm of uncertain behavior of skin: Secondary | ICD-10-CM | POA: Diagnosis not present

## 2021-06-28 DIAGNOSIS — L821 Other seborrheic keratosis: Secondary | ICD-10-CM | POA: Diagnosis not present

## 2021-06-28 DIAGNOSIS — Z23 Encounter for immunization: Secondary | ICD-10-CM | POA: Diagnosis not present

## 2021-06-28 DIAGNOSIS — L308 Other specified dermatitis: Secondary | ICD-10-CM | POA: Diagnosis not present

## 2021-06-28 DIAGNOSIS — D0439 Carcinoma in situ of skin of other parts of face: Secondary | ICD-10-CM | POA: Diagnosis not present

## 2021-06-28 MED ORDER — SULFAMETHOXAZOLE-TRIMETHOPRIM 800-160 MG PO TABS: 1.0000 | ORAL_TABLET | Freq: Two times a day (BID) | ORAL | 0 refills | Status: AC

## 2021-06-28 NOTE — Progress Notes (Signed)
Vaginitis testing negative. Symptomatic for UTI. Bactrim Rx sent to pharmacy

## 2021-07-04 DIAGNOSIS — M545 Low back pain, unspecified: Secondary | ICD-10-CM | POA: Diagnosis not present

## 2021-07-07 LAB — URINE CULTURE

## 2021-07-11 ENCOUNTER — Ambulatory Visit (HOSPITAL_BASED_OUTPATIENT_CLINIC_OR_DEPARTMENT_OTHER): Payer: Medicare HMO

## 2021-07-11 DIAGNOSIS — I10 Essential (primary) hypertension: Secondary | ICD-10-CM

## 2021-07-11 DIAGNOSIS — E785 Hyperlipidemia, unspecified: Secondary | ICD-10-CM

## 2021-07-11 DIAGNOSIS — E118 Type 2 diabetes mellitus with unspecified complications: Secondary | ICD-10-CM

## 2021-07-31 ENCOUNTER — Other Ambulatory Visit: Payer: Self-pay

## 2021-07-31 ENCOUNTER — Encounter: Payer: Self-pay | Admitting: Physician Assistant

## 2021-07-31 ENCOUNTER — Ambulatory Visit (INDEPENDENT_AMBULATORY_CARE_PROVIDER_SITE_OTHER): Payer: Medicare HMO | Admitting: Physician Assistant

## 2021-07-31 VITALS — BP 151/64 | HR 70 | Ht 66.5 in | Wt 164.0 lb

## 2021-07-31 DIAGNOSIS — M5136 Other intervertebral disc degeneration, lumbar region: Secondary | ICD-10-CM

## 2021-07-31 DIAGNOSIS — M5442 Lumbago with sciatica, left side: Secondary | ICD-10-CM | POA: Diagnosis not present

## 2021-07-31 DIAGNOSIS — G8929 Other chronic pain: Secondary | ICD-10-CM

## 2021-07-31 DIAGNOSIS — M5416 Radiculopathy, lumbar region: Secondary | ICD-10-CM | POA: Diagnosis not present

## 2021-07-31 MED ORDER — CYCLOBENZAPRINE HCL 5 MG PO TABS: 5.0000 mg | ORAL_TABLET | Freq: Three times a day (TID) | ORAL | 0 refills | Status: DC | PRN

## 2021-07-31 MED ORDER — TRAMADOL HCL 50 MG PO TABS
50.0000 mg | ORAL_TABLET | Freq: Four times a day (QID) | ORAL | 0 refills | Status: AC | PRN
Start: 2021-07-31 — End: 2021-08-05

## 2021-07-31 MED ORDER — PREDNISONE 50 MG PO TABS: ORAL_TABLET | ORAL | 0 refills | Status: DC

## 2021-07-31 NOTE — Patient Instructions (Addendum)
Icy hot patches Tens units   Piriformis Syndrome Rehab Ask your health care provider which exercises are safe for you. Do exercises exactly as told by your health care provider and adjust them as directed. It is normal to feel mild stretching, pulling, tightness, or discomfort as you do these exercises. Stop right away if you feel sudden pain or your pain gets worse. Do not begin these exercises until told by your health care provider. Stretching and range-of-motion exercises These exercises warm up your muscles and joints and improve the movement and flexibility of your hip and pelvis. The exercises also help to relieve pain, numbness, and tingling. Nerve root  Sit on a firm surface that is high enough that you can swing your left / right foot freely. Place a folded towel under your left / right thigh. This is optional. Drop your head forward and round your back. While you keep your left / right foot relaxed, slowly straighten your left / right knee until you feel a slight pull behind your knee or calf. If your leg is fully extended and you still do not feel a pull, slowly tilt your foot and toes toward you. Hold this position for __________ seconds. Slowly return your knee to its starting position. Hip rotation This is an exercise in which you lie on your back and stretch the muscles that rotate your hip (hip rotators) to stretch your buttocks. Lie on your back on a firm surface. Pull your left / right knee toward your same shoulder with your left / right hand until your knee is pointing toward the ceiling. Hold your left / right ankle with your other hand. Keeping your knee steady, gently pull your left / right ankle toward your other shoulder until you feel a stretch in your buttocks. Hold this position for __________ seconds. Repeat __________ times. Complete this exercise __________ times a day. Hip extensor This is an exercise in which you lie on your back and pull your knee to your  chest. Lie on your back on a firm surface. Both of your legs should be straight. Pull your left / right knee to your chest. Hold your leg in this position by holding on to the back of your thigh or the front of your knee. Hold this position for __________ seconds. Slowly return to the starting position. Repeat __________ times. Complete this exercise __________ times a day. Strengthening exercises These exercises build strength and endurance in your hip and thigh muscles. Endurance is the ability to use your muscles for a long time, even after they get tired. Straight leg raises, side-lying This exercise strengthens the muscles that rotate the leg at the hip and move it away from your body (hip abductors). Lie on your side with your left / right leg in the top position. Lie so your head, shoulder, knee, and hip line up. Bend your bottom knee to help you balance. Lift your top leg 4-6 inches (10-15 cm) while keeping your toes pointed straight ahead. Hold this position for __________ seconds. Slowly lower your leg to the starting position. Let your muscles relax completely after each repetition. Repeat __________ times. Complete this exercise __________ times a day. Hip abduction and rotation This is sometimes called quadruped (on hands and knees) exercises. Get on your hands and knees on a firm, lightly padded surface. Your hands should be directly below your shoulders, and your knees should be directly below your hips. Lift your left / right knee out to the side.  Keep your knee bent. Do not twist your body. Hold this position for __________ seconds. Slowly lower your leg. Repeat __________ times. Complete this exercise __________ times a day. Straight leg raises, prone This exercise stretches the muscles that move the hips (hip extensors). Lie on your abdomen on a firm surface (prone position). Tense the muscles in your buttocks and lift your left / right leg about 4 inches (10 cm). Keep  your knee straight as you lift your leg. If you cannot lift your leg that high without arching your back, place a pillow under your hips. Hold this position for __________ seconds. Slowly lower your leg to the starting position. Let your muscles relax completely after each repetition. Repeat __________ times. Complete this exercise __________ times a day. This information is not intended to replace advice given to you by your health care provider. Make sure you discuss any questions you have with your health care provider. Document Revised: 01/30/2021 Document Reviewed: 01/30/2021 Elsevier Patient Education  [place].

## 2021-07-31 NOTE — Progress Notes (Signed)
Subjective:    Patient ID: Ann Griffith, female    DOB: 03-19-1951, 70 y.o.   MRN: [id number]  HPI Pt is a 70 yo female with hx of lumbar DDD and left low back pain who presents to the clinic today in a flare. Started about 1 week ago. No injury. At times she cannot walk at all. Last xray was 2020. Not had PT. This is her 2nd flare. No bowel or bladder dysfunction, saddle anesthesia or leg weakness.   .. Active Ambulatory Problems    Diagnosis Date Noted   Type 2 diabetes mellitus with complication, without long-term current use of insulin ([place]) 11/09/2007   SCHATZKI'S RING 11/09/2007   CHOLELITHIASIS, HX OF 11/09/2007   Diverticulosis 06/07/2013   Raynaud's syndrome 08/09/2013   BPPV (benign paroxysmal positional vertigo), right 11/02/2014   Microalbuminuria 11/02/2014   Hyperlipidemia LDL goal <70 11/03/2014   Seborrheic dermatitis 03/14/2015   Memory changes 05/05/2015   Insomnia 08/08/2015   Bilateral leg pain 08/08/2015   Shoulder bursitis 08/08/2015   Avitaminosis D 10/09/2012   Colon polyp 04/24/2011   Tennis elbow syndrome 03/29/2016   RLS (restless legs syndrome) 03/29/2016   Chronic right shoulder pain 04/01/2016   Cataract cortical, senile, left 06/04/2016   Elevated serum creatinine 12/10/2016   Trigger finger, left ring finger 06/26/2017   Primary osteoarthritis of both knees 06/26/2017   Carpal tunnel syndrome, right 06/26/2017   Dry skin dermatitis 06/26/2017   Trochanteric bursitis 06/27/2017   Fear of flying 09/28/2017   Orthostatic hypotension 12/16/2017   Syncope and collapse 12/16/2017   Mitral regurgitation 12/19/2017   Tricuspid regurgitation 12/19/2017   Raynaud's phenomenon without gangrene 02/03/2018   Concussion 12/08/2017   Essential hypertension 12/10/2017   Combined hyperlipidemia associated with type 2 diabetes mellitus ([place]) 04/24/2011   Itching 04/27/2018   Chronic pain of both ankles 04/27/2018   Bilateral carpal tunnel syndrome  04/27/2018   Statin intolerance 07/03/2018   Leg cramping 09/03/2019   Toe pain, right 12/01/2019   Degenerative disc disease, cervical 12/07/2019   Paronychia of great toe, left 02/07/2020   Myalgia 03/14/2020   Ingrown toenail of right foot 03/14/2020   Iron deficiency anemia 06/13/2020   Facet arthritis, degenerative, lumbar spine 06/28/2020   Acute left-sided low back pain with left-sided sciatica 06/28/2020   DDD (degenerative disc disease), lumbar 12/12/2020   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 03/13/2021   CKD (chronic kidney disease) stage 3, GFR 30-59 ml/min (HCC) 03/14/2021   Squamous cell carcinoma in situ 03/19/2021   Gastroesophageal reflux disease without esophagitis 06/13/2021   Change in consistency of stool 06/13/2021   Left lumbar radiculitis 07/31/2021   Resolved Ambulatory Problems    Diagnosis Date Noted   Eustachian tube dysfunction 05/02/2014   Right shoulder pain 03/29/2016   Tricompartment osteoarthritis of knees, bilateral 06/25/2017   Controlled type 2 diabetes mellitus without complication, without long-term current use of insulin ([place]) 06/26/2017   Flatulence 09/28/2017   Nausea 12/16/2017   Hematoma and contusion 12/16/2017   Osteoarthritis of patellofemoral joints, bilateral 03/14/2021   Past Medical History:  Diagnosis Date   DIABETES MELLITUS, TYPE II 11/09/2007   Hyperlipidemia      Review of Systems See HPI.     Objective:   Physical Exam Vitals reviewed.  Constitutional:      Appearance: Normal appearance. She is obese.  HENT:     Head: Normocephalic.     Right Ear: Tympanic membrane normal.  Left Ear: Tympanic membrane normal.  Cardiovascular:     Rate and Rhythm: Normal rate and regular rhythm.  Musculoskeletal:     Right lower leg: No edema.     Left lower leg: No edema.     Comments: No tenderness over lumbar spine to palpation Tenderness over left piriformis and glutes.  Tightness in hamstring with SLR to the left.   Strength of lower extremity 5/5.   Neurological:     General: No focal deficit present.     Mental Status: She is alert and oriented to person, place, and time.  Psychiatric:        Mood and Affect: Mood normal.          Assessment & Plan:  [name] was seen today for pain.  Diagnoses and all orders for this visit:  Chronic left-sided low back pain with left-sided sciatica -     predniSONE (DELTASONE) 50 MG tablet; Take one tablet for 5 days. -     cyclobenzaprine (FLEXERIL) 5 MG tablet; Take 1 tablet (5 mg total) by mouth 3 (three) times daily as needed for muscle spasms. -     traMADol (ULTRAM) 50 MG tablet; Take 1 tablet (50 mg total) by mouth every 6 (six) hours as needed for up to 5 days.  Left lumbar radiculitis -     predniSONE (DELTASONE) 50 MG tablet; Take one tablet for 5 days. -     cyclobenzaprine (FLEXERIL) 5 MG tablet; Take 1 tablet (5 mg total) by mouth 3 (three) times daily as needed for muscle spasms. -     traMADol (ULTRAM) 50 MG tablet; Take 1 tablet (50 mg total) by mouth every 6 (six) hours as needed for up to 5 days.  DDD (degenerative disc disease), lumbar -     predniSONE (DELTASONE) 50 MG tablet; Take one tablet for 5 days. -     cyclobenzaprine (FLEXERIL) 5 MG tablet; Take 1 tablet (5 mg total) by mouth 3 (three) times daily as needed for muscle spasms. -     traMADol (ULTRAM) 50 MG tablet; Take 1 tablet (50 mg total) by mouth every 6 (six) hours as needed for up to 5 days.   2nd flare of low back pain and radiculitis.  No red flags.  Prednisone burst sent Flexeril as needed Tramadol as needed .[name] KitchenPDMP reviewed during this encounter. Small as needed quanity given.  Continue diclofenac Discussed icy hot patches, tens units, massages, exercises, and PT.  HO given.

## 2021-08-11 ENCOUNTER — Other Ambulatory Visit: Payer: Self-pay | Admitting: Physician Assistant

## 2021-08-11 DIAGNOSIS — I10 Essential (primary) hypertension: Secondary | ICD-10-CM

## 2021-08-11 DIAGNOSIS — E118 Type 2 diabetes mellitus with unspecified complications: Secondary | ICD-10-CM

## 2021-08-30 ENCOUNTER — Other Ambulatory Visit: Payer: Self-pay | Admitting: Physician Assistant

## 2021-08-30 DIAGNOSIS — L853 Xerosis cutis: Secondary | ICD-10-CM

## 2021-09-03 DIAGNOSIS — H26492 Other secondary cataract, left eye: Secondary | ICD-10-CM | POA: Diagnosis not present

## 2021-09-04 ENCOUNTER — Other Ambulatory Visit: Payer: Self-pay | Admitting: Physician Assistant

## 2021-09-04 DIAGNOSIS — E118 Type 2 diabetes mellitus with unspecified complications: Secondary | ICD-10-CM

## 2021-09-17 ENCOUNTER — Other Ambulatory Visit: Payer: Self-pay

## 2021-09-17 ENCOUNTER — Ambulatory Visit (INDEPENDENT_AMBULATORY_CARE_PROVIDER_SITE_OTHER): Payer: Medicare HMO | Admitting: Family Medicine

## 2021-09-17 ENCOUNTER — Other Ambulatory Visit: Payer: Self-pay | Admitting: Physician Assistant

## 2021-09-17 ENCOUNTER — Encounter: Payer: Self-pay | Admitting: Family Medicine

## 2021-09-17 VITALS — BP 136/77 | HR 60 | Resp 16 | Ht 66.5 in | Wt 162.0 lb

## 2021-09-17 DIAGNOSIS — K529 Noninfective gastroenteritis and colitis, unspecified: Secondary | ICD-10-CM | POA: Diagnosis not present

## 2021-09-17 DIAGNOSIS — E118 Type 2 diabetes mellitus with unspecified complications: Secondary | ICD-10-CM

## 2021-09-17 DIAGNOSIS — M5416 Radiculopathy, lumbar region: Secondary | ICD-10-CM

## 2021-09-17 DIAGNOSIS — M5136 Other intervertebral disc degeneration, lumbar region: Secondary | ICD-10-CM | POA: Diagnosis not present

## 2021-09-17 DIAGNOSIS — M25572 Pain in left ankle and joints of left foot: Secondary | ICD-10-CM

## 2021-09-17 DIAGNOSIS — N1831 Chronic kidney disease, stage 3a: Secondary | ICD-10-CM

## 2021-09-17 DIAGNOSIS — K219 Gastro-esophageal reflux disease without esophagitis: Secondary | ICD-10-CM

## 2021-09-17 DIAGNOSIS — M5442 Lumbago with sciatica, left side: Secondary | ICD-10-CM | POA: Diagnosis not present

## 2021-09-17 DIAGNOSIS — M25571 Pain in right ankle and joints of right foot: Secondary | ICD-10-CM | POA: Diagnosis not present

## 2021-09-17 DIAGNOSIS — I10 Essential (primary) hypertension: Secondary | ICD-10-CM | POA: Diagnosis not present

## 2021-09-17 DIAGNOSIS — M25562 Pain in left knee: Secondary | ICD-10-CM

## 2021-09-17 DIAGNOSIS — G2581 Restless legs syndrome: Secondary | ICD-10-CM

## 2021-09-17 DIAGNOSIS — E785 Hyperlipidemia, unspecified: Secondary | ICD-10-CM

## 2021-09-17 DIAGNOSIS — M25561 Pain in right knee: Secondary | ICD-10-CM

## 2021-09-17 DIAGNOSIS — G8929 Other chronic pain: Secondary | ICD-10-CM

## 2021-09-17 LAB — POCT GLYCOSYLATED HEMOGLOBIN (HGB A1C): Hemoglobin A1C: 5.9 % — AB (ref 4.0–5.6)

## 2021-09-17 LAB — BASIC METABOLIC PANEL WITH GFR
BUN: 18 mg/dL (ref 7–25)
CO2: 25 mmol/L (ref 20–32)
Calcium: 9.6 mg/dL (ref 8.6–10.4)
Chloride: 101 mmol/L (ref 98–110)
Creat: 0.94 mg/dL (ref 0.60–1.00)
Glucose, Bld: 121 mg/dL — ABNORMAL HIGH (ref 65–99)
Potassium: 4.5 mmol/L (ref 3.5–5.3)
Sodium: 136 mmol/L (ref 135–146)
eGFR: 65 mL/min/{1.73_m2} (ref >=60)

## 2021-09-17 LAB — LIPID PANEL W/REFLEX DIRECT LDL
Cholesterol: 174 mg/dL (ref <200)
HDL: 76 mg/dL (ref >=50)
LDL Cholesterol (Calc): 82 mg/dL (calc)
Non-HDL Cholesterol (Calc): 98 mg/dL (calc) (ref <130)
Total CHOL/HDL Ratio: 2.3 (calc) (ref <5.0)
Triglycerides: 75 mg/dL (ref <150)

## 2021-09-17 MED ORDER — CYCLOBENZAPRINE HCL 5 MG PO TABS: 5.0000 mg | ORAL_TABLET | Freq: Three times a day (TID) | ORAL | 0 refills | Status: DC | PRN

## 2021-09-17 MED ORDER — PREDNISONE 50 MG PO TABS: ORAL_TABLET | ORAL | 0 refills | Status: DC

## 2021-09-17 MED ORDER — ROSUVASTATIN CALCIUM 20 MG PO TABS: 20.0000 mg | ORAL_TABLET | Freq: Every day | ORAL | 3 refills | Status: DC

## 2021-09-17 MED ORDER — DICLOFENAC SODIUM 75 MG PO TBEC: 75.0000 mg | DELAYED_RELEASE_TABLET | Freq: Two times a day (BID) | ORAL | 3 refills | Status: DC

## 2021-09-17 NOTE — Progress Notes (Signed)
Established Patient Office Visit  Subjective:  Patient ID: Ann Griffith, female    DOB: 16-Oct-1950  Age: 71 y.o. MRN: [id number]  CC:  Chief Complaint  Patient presents with   Diabetes    Follow up    Discuss Medications    Patient would like to discuss if she should continue on Metformin and Protonix   Medication Refill    Prednisone and Flexeril. Patient states she is traveling for 3 weeks and would like to have a refill to help with sciatica pain.     HPI Ann Griffith presents for   Diabetes - no hypoglycemic events. No wounds or sores that are not healing well. No increased thirst or urination. Checking glucose at home. Taking medications as prescribed without any side effects.  She did have concerns about taking metformin long-term and wanted to discuss that today.  Hypertension- Pt denies chest pain, SOB, dizziness, or heart palpitations.  Taking meds as directed w/o problems.  Denies medication side effects.    F/U GERD  -she is currently taking Protonix daily 40 mg.  She said she tried taking it every other day but reflux symptoms started back but she really would like to be able to come off the medication at some point.  Would also like a refill prescription for muscle relaxer and her prednisone.  She says the last time she traveled she flared her sciatica and is worried it will happen again.  She will be traveling out of town for about 3 weeks.  She also like a refill on her NSAID.  Reports that she is been having loose stools for years but its been particularly worse over the last 6 months to the point that she has been having accidents in bed at night.  She would like referral back to [facility].  Past Medical History:  Diagnosis Date   CHOLELITHIASIS, HX OF 11/09/2007   Qualifier: Diagnosis of  By: Smith NCMA, [place], TYPE II 11/09/2007   Qualifier: Diagnosis of  By: [name] Mcalpine     Diverticulosis    Hyperlipidemia     Past  Surgical History:  Procedure Laterality Date   bone spur removed  2003   left heel   CATARACT EXTRACTION Left 02/2016   CHOLECYSTECTOMY     COLONOSCOPY  08/2003    Family History  Problem Relation Age of Onset   Diabetes Mother     Social History   Socioeconomic History   Marital status: Married    Spouse name: Not on file   Number of children: 1   Years of education: 12   Highest education level: High school graduate  Occupational History   Occupation: data entry clerk in receiving    Comment: Retired  Tobacco Use   Smoking status: Never   Smokeless tobacco: Never  Vaping Use   Vaping Use: Never used  Substance and Sexual Activity   Alcohol use: Yes    Alcohol/week: 0.0 - 1.0 standard drinks    Comment: occasionally   Drug use: No   Sexual activity: Yes    Partners: Male    Birth control/protection: Post-menopausal  Other Topics Concern   Not on file  Social History Narrative   Lives with her husband. Likes to go to movies and travel. Works out 3 times a week for 1.5 hrs.    Social Determinants of Health   Financial Resource Strain: Low Risk    Difficulty of Paying Living  Expenses: Not hard at all  Food Insecurity: No Food Insecurity   Worried About Charity fundraiser in the Last Year: Never true   Ran Out of Food in the Last Year: Never true  Transportation Needs: No Transportation Needs   Lack of Transportation (Medical): No   Lack of Transportation (Non-Medical): No  Physical Activity: Sufficiently Active   Days of Exercise per Week: 3 days   Minutes of Exercise per Session: 150+ min  Stress: No Stress Concern Present   Feeling of Stress : Not at all  Social Connections: Moderately Isolated   Frequency of Communication with Friends and Family: Twice a week   Frequency of Social Gatherings with Friends and Family: More than three times a week   Attends Religious Services: Never   Marine scientist or Organizations: No   Attends Programme researcher, broadcasting/film/video: Never   Marital Status: Married  Human resources officer Violence: Not At Risk   Fear of Current or Ex-Partner: No   Emotionally Abused: No   Physically Abused: No   Sexually Abused: No    Outpatient Medications Prior to Visit  Medication Sig Dispense Refill   amLODipine (NORVASC) 2.5 MG tablet Take 1-2 tablets (2.5-5 mg total) by mouth daily. 180 tablet 0   Cyanocobalamin (VITAMIN B-12 PO) Take 5,000 mcg by mouth every other day.      diclofenac Sodium (VOLTAREN) 1 % GEL APPLY 4 GRAMS 4 TIMES DAILY TOPICALLY. TO AFFECTED JOINT. 100 g 9   ferrous sulfate 325 (65 FE) MG EC tablet TAKE 1 TABLET BY MOUTH EVERY DAY WITH BREAKFAST 90 tablet 3   fish oil-omega-3 fatty acids 1000 MG capsule Take 1 g by mouth daily.     lisinopril (ZESTRIL) 2.5 MG tablet TAKE 1 TABLET BY MOUTH EVERY DAY 90 tablet 1   MAGNESIUM PO Take 250 mg by mouth daily. Taking 1.5 tablets daily     metFORMIN (GLUCOPHAGE) 1000 MG tablet TAKE 1 TABLET (1,000 MG TOTAL) BY MOUTH 2 (TWO) TIMES DAILY WITH A MEAL. 180 tablet 1   pantoprazole (PROTONIX) 40 MG tablet Take 1 tablet (40 mg total) by mouth daily. 90 tablet 3   rOPINIRole (REQUIP) 0.5 MG tablet TAKE 1 TABLET BY MOUTH 3 TIMES DAILY. 270 tablet 3   triamcinolone cream (KENALOG) 0.1 % APPLY TO AFFECTED AREA TWICE A DAY 60 g 0   TURMERIC PO Take 500 mg by mouth daily.     VITAMIN D PO Take 5,000 Int'l Units by mouth daily.     diclofenac (VOLTAREN) 75 MG EC tablet Take 1 tablet (75 mg total) by mouth 2 (two) times daily. 180 tablet 3   rosuvastatin (CRESTOR) 20 MG tablet Take 1 tablet (20 mg total) by mouth daily. 90 tablet 3   cyclobenzaprine (FLEXERIL) 5 MG tablet Take 1 tablet (5 mg total) by mouth 3 (three) times daily as needed for muscle spasms. (Patient not taking: Reported on 09/17/2021) 30 tablet 0   predniSONE (DELTASONE) 50 MG tablet Take one tablet for 5 days. (Patient not taking: Reported on 09/17/2021) 5 tablet 0   No facility-administered  medications prior to visit.    Allergies  Allergen Reactions   Amoxicillin Swelling   Gabapentin     dizzy   Atorvastatin Other (See Comments)    Memory loss    ROS Review of Systems    Objective:    Physical Exam Constitutional:      Appearance: Normal appearance. She is well-developed.  HENT:  Head: Normocephalic and atraumatic.  Cardiovascular:     Rate and Rhythm: Normal rate and regular rhythm.     Heart sounds: Normal heart sounds.  Pulmonary:     Effort: Pulmonary effort is normal.     Breath sounds: Normal breath sounds.  Skin:    General: Skin is warm and dry.  Neurological:     Mental Status: She is alert and oriented to person, place, and time.  Psychiatric:        Behavior: Behavior normal.    BP 136/77 (BP Location: Left Arm)    Pulse 60    Resp 16    Ht 5' 6.5" (1.689 m)    Wt 162 lb (73.5 kg)    LMP 08/12/2000    SpO2 99%    BMI 25.76 kg/m  Wt Readings from Last 3 Encounters:  09/17/21 162 lb (73.5 kg)  07/31/21 164 lb (74.4 kg)  06/13/21 166 lb (75.3 kg)     Health Maintenance Due  Topic Date Due   OPHTHALMOLOGY EXAM  09/16/2021    There are no preventive care reminders to display for this patient.  Lab Results  Component Value Date   TSH 2.98 09/14/2020   Lab Results  Component Value Date   WBC 5.5 03/13/2021   HGB 11.9 03/13/2021   HCT 36.7 03/13/2021   MCV 98.1 03/13/2021   PLT 221 03/13/2021   Lab Results  Component Value Date   NA 137 03/13/2021   K 4.4 03/13/2021   CO2 25 03/13/2021   GLUCOSE 146 (H) 03/13/2021   BUN 20 03/13/2021   CREATININE 1.05 (H) 03/13/2021   BILITOT 0.5 03/13/2021   ALKPHOS 46 12/03/2016   AST 18 03/13/2021   ALT 18 03/13/2021   PROT 7.2 03/13/2021   ALBUMIN 4.3 12/03/2016   CALCIUM 9.5 03/13/2021   EGFR 57 (L) 03/13/2021   Lab Results  Component Value Date   CHOL 173 09/14/2020   Lab Results  Component Value Date   HDL 80 09/14/2020   Lab Results  Component Value Date    LDLCALC 79 09/14/2020   Lab Results  Component Value Date   TRIG 67 09/14/2020   Lab Results  Component Value Date   CHOLHDL 2.2 09/14/2020   Lab Results  Component Value Date   HGBA1C 5.9 (A) 09/17/2021      Assessment & Plan:   Problem List Items Addressed This Visit       Cardiovascular and Mediastinum   Essential hypertension    Pressure is a little elevated today encouraged her to start checking at home and bring in her home machine and home blood pressures in a few weeks for a nurse visit.      Relevant Medications   rosuvastatin (CRESTOR) 20 MG tablet   Other Relevant Orders   Lipid Panel w/reflex Direct LDL   BASIC METABOLIC PANEL WITH GFR     Digestive   Gastroesophageal reflux disease without esophagitis    Ok to dec her protonix down to 81m. Ok to split tab in half and take half a tab daily.          Endocrine   Type 2 diabetes mellitus with complication, without long-term current use of insulin (HCC) - Primary    A1c looks phenomenal today at 5.9.  We discussed that if her A1c still looks great at next visit and she could certainly consider decreasing her metformin down to 500 mg she can discuss with Jade at that  appointment.      Relevant Medications   rosuvastatin (CRESTOR) 20 MG tablet   Other Relevant Orders   POCT HgB A1C (Completed)   Lipid Panel w/reflex Direct LDL   BASIC METABOLIC PANEL WITH GFR     Nervous and Auditory   Left lumbar radiculitis   Relevant Medications   cyclobenzaprine (FLEXERIL) 5 MG tablet   predniSONE (DELTASONE) 50 MG tablet     Musculoskeletal and Integument   DDD (degenerative disc disease), lumbar    Go ahead and refill her muscle relaxer and prednisone and NSAID to use as needed.  She wanted to have the prednisone in case she has an acute flare with her sciatica.  Typically we would have her come in for evaluation for this that I did go ahead and send in the 5-day prednisone burst.      Relevant Medications    cyclobenzaprine (FLEXERIL) 5 MG tablet   predniSONE (DELTASONE) 50 MG tablet   diclofenac (VOLTAREN) 75 MG EC tablet     Genitourinary   CKD (chronic kidney disease) stage 3, GFR 30-59 ml/min (HCC)    Due to recheck renal function.         Other   Hyperlipidemia LDL goal <70   Relevant Medications   rosuvastatin (CRESTOR) 20 MG tablet   Chronic pain of both ankles   Relevant Medications   diclofenac (VOLTAREN) 75 MG EC tablet   Other Visit Diagnoses     Chronic left-sided low back pain with left-sided sciatica       Relevant Medications   cyclobenzaprine (FLEXERIL) 5 MG tablet   predniSONE (DELTASONE) 50 MG tablet   diclofenac (VOLTAREN) 75 MG EC tablet   Chronic pain of both knees       Relevant Medications   cyclobenzaprine (FLEXERIL) 5 MG tablet   predniSONE (DELTASONE) 50 MG tablet   diclofenac (VOLTAREN) 75 MG EC tablet   Chronic diarrhea       Relevant Orders   Ambulatory referral to Gastroenterology       Meds ordered this encounter  Medications   cyclobenzaprine (FLEXERIL) 5 MG tablet    Sig: Take 1 tablet (5 mg total) by mouth 3 (three) times daily as needed for muscle spasms.    Dispense:  30 tablet    Refill:  0   predniSONE (DELTASONE) 50 MG tablet    Sig: Take one tablet for 5 days.    Dispense:  5 tablet    Refill:  0   diclofenac (VOLTAREN) 75 MG EC tablet    Sig: Take 1 tablet (75 mg total) by mouth 2 (two) times daily.    Dispense:  60 tablet    Refill:  3    DX Code Needed  PAT REQ.   rosuvastatin (CRESTOR) 20 MG tablet    Sig: Take 1 tablet (20 mg total) by mouth daily.    Dispense:  90 tablet    Refill:  3    Follow-up: Return in about 1 month (around 10/15/2021) for Hypertension Nurse visit  .    Beatrice Lecher, MD

## 2021-09-17 NOTE — Assessment & Plan Note (Signed)
Ok to dec her protonix down to 20mg . Ok to split tab in half and take half a tab daily.

## 2021-09-17 NOTE — Assessment & Plan Note (Signed)
Pressure is a little elevated today encouraged her to start checking at home and bring in her home machine and home blood pressures in a few weeks for a nurse visit.

## 2021-09-17 NOTE — Assessment & Plan Note (Signed)
Go ahead and refill her muscle relaxer and prednisone and NSAID to use as needed.  She wanted to have the prednisone in case she has an acute flare with her sciatica.  Typically we would have her come in for evaluation for this that I did go ahead and send in the 5-day prednisone burst.

## 2021-09-17 NOTE — Assessment & Plan Note (Signed)
Due to recheck renal function. 

## 2021-09-17 NOTE — Assessment & Plan Note (Signed)
A1c looks phenomenal today at 5.9.  We discussed that if her A1c still looks great at next visit and she could certainly consider decreasing her metformin down to 500 mg she can discuss with Jade at that appointment.

## 2021-09-17 NOTE — Progress Notes (Signed)
Record release form faxed to [facility]/last eye exam 09/12/2021.

## 2021-09-18 NOTE — Progress Notes (Signed)
HI Ann Griffith,  Your kidney function actually looks a little bit better this time.  Electrolytes are normal.  LDL cholesterol is under 100 which is good.  Triglycerides also look good.  You really have great HDL cholesterol.

## 2021-09-20 ENCOUNTER — Telehealth: Payer: Self-pay

## 2021-09-20 NOTE — Telephone Encounter (Addendum)
Initiated Prior authorization BRK:[id number] HCl 5MG  tablets Via: Covermymeds Case/Key: [id number] Status: approved as of 09/20/21 Reason:covered coverage from 08/12/2021 - 12/19/2021 Notified Pt via: Mychart

## 2021-09-21 NOTE — Telephone Encounter (Signed)
Received Notice of Approval  Approved through 08/12/2021 - 12/19/2021  Placed in [employer]

## 2021-10-22 ENCOUNTER — Ambulatory Visit: Payer: Medicare HMO

## 2021-10-22 DIAGNOSIS — K589 Irritable bowel syndrome without diarrhea: Secondary | ICD-10-CM | POA: Diagnosis not present

## 2021-10-22 DIAGNOSIS — R195 Other fecal abnormalities: Secondary | ICD-10-CM | POA: Diagnosis not present

## 2021-10-29 ENCOUNTER — Ambulatory Visit: Payer: Medicare HMO

## 2021-11-20 ENCOUNTER — Ambulatory Visit (INDEPENDENT_AMBULATORY_CARE_PROVIDER_SITE_OTHER): Payer: Medicare HMO | Admitting: Family Medicine

## 2021-11-20 VITALS — BP 136/77 | HR 79

## 2021-11-20 DIAGNOSIS — I10 Essential (primary) hypertension: Secondary | ICD-10-CM

## 2021-11-20 NOTE — Progress Notes (Signed)
Agree with statement above.  She reports home blood pressures have been running a little high but forgot to bring in her log with her.  Were happy to review since it does seem like her cuff is fairly accurate. ?

## 2021-11-20 NOTE — Progress Notes (Signed)
Patient comes in today to check the accuracy of her home blood pressure cuff.  ? ?Penelope Fittro is taking Amlodipine 2.5 mg and lisinopril 2.5 mg daily for blood pressure control. Kahlani denies any missed doses, side effects, headaches, chest pain, palpitations, dizziness, or shortness of breath.  ? ?Trinia Georgi has been checking blood pressure readings at home, but did not bring these readings with her today. ? ?Her first blood pressure reading today with our machine is 136/77. ?Her home machine reading is 144/81.  ?Patient aware it appears her machine is accurate. She will send home readings through [facility] for review and will call us if consistently high at home.  ? ? ?

## 2021-11-25 ENCOUNTER — Other Ambulatory Visit: Payer: Self-pay | Admitting: Physician Assistant

## 2021-11-25 DIAGNOSIS — G2581 Restless legs syndrome: Secondary | ICD-10-CM

## 2021-11-25 DIAGNOSIS — R252 Cramp and spasm: Secondary | ICD-10-CM

## 2021-11-29 DIAGNOSIS — E785 Hyperlipidemia, unspecified: Secondary | ICD-10-CM | POA: Diagnosis not present

## 2021-11-29 DIAGNOSIS — I10 Essential (primary) hypertension: Secondary | ICD-10-CM | POA: Diagnosis not present

## 2021-11-29 DIAGNOSIS — G2581 Restless legs syndrome: Secondary | ICD-10-CM | POA: Diagnosis not present

## 2021-11-29 DIAGNOSIS — K219 Gastro-esophageal reflux disease without esophagitis: Secondary | ICD-10-CM | POA: Diagnosis not present

## 2021-11-29 DIAGNOSIS — Z008 Encounter for other general examination: Secondary | ICD-10-CM | POA: Diagnosis not present

## 2021-11-29 DIAGNOSIS — Z791 Long term (current) use of non-steroidal anti-inflammatories (NSAID): Secondary | ICD-10-CM | POA: Diagnosis not present

## 2021-11-29 DIAGNOSIS — Z7984 Long term (current) use of oral hypoglycemic drugs: Secondary | ICD-10-CM | POA: Diagnosis not present

## 2021-11-29 DIAGNOSIS — E1142 Type 2 diabetes mellitus with diabetic polyneuropathy: Secondary | ICD-10-CM | POA: Diagnosis not present

## 2021-12-11 ENCOUNTER — Ambulatory Visit (INDEPENDENT_AMBULATORY_CARE_PROVIDER_SITE_OTHER): Payer: Medicare HMO | Admitting: Physician Assistant

## 2021-12-11 DIAGNOSIS — Z0000 Encounter for general adult medical examination without abnormal findings: Secondary | ICD-10-CM

## 2021-12-11 NOTE — Patient Instructions (Addendum)
?MEDICARE ANNUAL WELLNESS VISIT ?Health Maintenance Summary and Written Plan of Care ? ?Ms. Kenner , ? ?Thank you for allowing me to perform your Medicare Annual Wellness Visit and for your ongoing commitment to your health.  ? ?Health Maintenance & Immunization History ?Health Maintenance  ?Topic Date Due  ? COVID-19 Vaccine (4 - Booster for Ruth series) 12/27/2021 (Originally 06/23/2020)  ? INFLUENZA VACCINE  03/12/2022  ? HEMOGLOBIN A1C  03/17/2022  ? FOOT EXAM  09/17/2022  ? OPHTHALMOLOGY EXAM  09/17/2022  ? TETANUS/TDAP  10/15/2022  ? MAMMOGRAM  04/25/2023  ? COLONOSCOPY (Pts 45-60yr Insurance coverage will need to be confirmed)  06/04/2023  ? Pneumonia Vaccine 72 Years old  Completed  ? DEXA SCAN  Completed  ? Hepatitis C Screening  Completed  ? Zoster Vaccines- Shingrix  Completed  ? HPV VACCINES  Aged Out  ? ?Immunization History  ?Administered Date(s) Administered  ? Fluad Quad(high Dose 65+) 05/31/2019, 06/13/2020  ? Influenza,inj,Quad PF,6+ Mos 05/02/2014, 05/05/2015, 06/04/2016, 06/24/2017, 04/27/2018  ? Influenza-Unspecified 06/07/2021  ? PFIZER(Purple Top)SARS-COV-2 Vaccination 10/05/2019, 10/26/2019, 04/28/2020  ? Pneumococcal Conjugate-13 05/05/2015  ? Pneumococcal Polysaccharide-23 06/04/2016  ? Tdap 12/21/2009, 10/14/2012  ? Zoster Recombinat (Shingrix) 11/17/2019, 03/28/2020  ? ? ?These are the patient goals that we discussed: ? Goals Addressed   ? ?  ?  ?  ?  ?  ? This Visit's Progress  ?   Patient Stated (pt-stated)     ?   Patient would like to loose 5 lbs. ?  ? ?  ?  ? ?This is a list of Health Maintenance Items that are overdue or due now: ?There are no preventive care reminders to display for this patient. ?  ? ?Orders/Referrals Placed Today: ?No orders of the defined types were placed in this encounter. ? ?(Contact our referral department at [phone] you have not spoken with someone about your referral appointment within the next 5 days)  ? ? ?Follow-up Plan ?Follow-up with  BDonella Stade PA-C as planned ?Please have your eye exam records from February, 2023 faxed over.  ?Medicare wellness visit in one year. ?AVS printed and mailed to the patient. ? ? ? ?  ?Health Maintenance, Female ?Adopting a healthy lifestyle and getting preventive care are important in promoting health and wellness. Ask your health care provider about: ?The right schedule for you to have regular tests and exams. ?Things you can do on your own to prevent diseases and keep yourself healthy. ?What should I know about diet, weight, and exercise? ?Eat a healthy diet ? ?Eat a diet that includes plenty of vegetables, fruits, low-fat dairy products, and lean protein. ?Do not eat a lot of foods that are high in solid fats, added sugars, or sodium. ?Maintain a healthy weight ?Body mass index (BMI) is used to identify weight problems. It estimates body fat based on height and weight. Your health care provider can help determine your BMI and help you achieve or maintain a healthy weight. ?Get regular exercise ?Get regular exercise. This is one of the most important things you can do for your health. Most adults should: ?Exercise for at least 150 minutes each week. The exercise should increase your heart rate and make you sweat (moderate-intensity exercise). ?Do strengthening exercises at least twice a week. This is in addition to the moderate-intensity exercise. ?Spend less time sitting. Even light physical activity can be beneficial. ?Watch cholesterol and blood lipids ?Have your blood tested for lipids and cholesterol at 72 years of  age, then have this test every 5 years. ?Have your cholesterol levels checked more often if: ?Your lipid or cholesterol levels are high. ?You are older than 72 years of age. ?You are at high risk for heart disease. ?What should I know about cancer screening? ?Depending on your health history and family history, you may need to have cancer screening at various ages. This may include screening  for: ?Breast cancer. ?Cervical cancer. ?Colorectal cancer. ?Skin cancer. ?Lung cancer. ?What should I know about heart disease, diabetes, and high blood pressure? ?Blood pressure and heart disease ?High blood pressure causes heart disease and increases the risk of stroke. This is more likely to develop in people who have high blood pressure readings or are overweight. ?Have your blood pressure checked: ?Every 3-5 years if you are 64-25 years of age. ?Every year if you are 52 years old or older. ?Diabetes ?Have regular diabetes screenings. This checks your fasting blood sugar level. Have the screening done: ?Once every three years after age 28 if you are at a normal weight and have a low risk for diabetes. ?More often and at a younger age if you are overweight or have a high risk for diabetes. ?What should I know about preventing infection? ?Hepatitis B ?If you have a higher risk for hepatitis B, you should be screened for this virus. Talk with your health care provider to find out if you are at risk for hepatitis B infection. ?Hepatitis C ?Testing is recommended for: ?Everyone born from [birth year] through 1966. ?Anyone with known risk factors for hepatitis C. ?Sexually transmitted infections (STIs) ?Get screened for STIs, including gonorrhea and chlamydia, if: ?You are sexually active and are younger than 72 years of age. ?You are older than 72 years of age and your health care provider tells you that you are at risk for this type of infection. ?Your sexual activity has changed since you were last screened, and you are at increased risk for chlamydia or gonorrhea. Ask your health care provider if you are at risk. ?Ask your health care provider about whether you are at high risk for HIV. Your health care provider may recommend a prescription medicine to help prevent HIV infection. If you choose to take medicine to prevent HIV, you should first get tested for HIV. You should then be tested every 3 months for as long as you  are taking the medicine. ?Pregnancy ?If you are about to stop having your period (premenopausal) and you may become pregnant, seek counseling before you get pregnant. ?Take 400 to 800 micrograms (mcg) of folic acid every day if you become pregnant. ?Ask for birth control (contraception) if you want to prevent pregnancy. ?Osteoporosis and menopause ?Osteoporosis is a disease in which the bones lose minerals and strength with aging. This can result in bone fractures. If you are 59 years old or older, or if you are at risk for osteoporosis and fractures, ask your health care provider if you should: ?Be screened for bone loss. ?Take a calcium or vitamin D supplement to lower your risk of fractures. ?Be given hormone replacement therapy (HRT) to treat symptoms of menopause. ?Follow these instructions at home: ?Alcohol use ?Do not drink alcohol if: ?Your health care provider tells you not to drink. ?You are pregnant, may be pregnant, or are planning to become pregnant. ?If you drink alcohol: ?Limit how much you have to: ?0-1 drink a day. ?Know how much alcohol is in your drink. In the [place] one drink equals one 12  oz bottle of beer (355 mL), one 5 oz glass of wine (148 mL), or one 1? oz glass of hard liquor (44 mL). ?Lifestyle ?Do not use any products that contain nicotine or tobacco. These products include cigarettes, chewing tobacco, and vaping devices, such as e-cigarettes. If you need help quitting, ask your health care provider. ?Do not use street drugs. ?Do not share needles. ?Ask your health care provider for help if you need support or information about quitting drugs. ?General instructions ?Schedule regular health, dental, and eye exams. ?Stay current with your vaccines. ?Tell your health care provider if: ?You often feel depressed. ?You have ever been abused or do not feel safe at home. ?Summary ?Adopting a healthy lifestyle and getting preventive care are important in promoting health and wellness. ?Follow your  health care provider's instructions about healthy diet, exercising, and getting tested or screened for diseases. ?Follow your health care provider's instructions on monitoring your cholesterol and blood

## 2021-12-11 NOTE — Progress Notes (Signed)
? ? ?MEDICARE ANNUAL WELLNESS VISIT ? ?12/11/2021 ? ?Telephone Visit Disclaimer ?This Medicare AWV was conducted by telephone due to national recommendations for restrictions regarding the COVID-19 Pandemic (e.g. social distancing).  I verified, using two identifiers, that I am speaking with Ann Griffith or their authorized healthcare agent. I discussed the limitations, risks, security, and privacy concerns of performing an evaluation and management service by telephone and the potential availability of an in-person appointment in the future. The patient expressed understanding and agreed to proceed.  ?Location of Patient: Home ?Location of Provider (nurse):  In the office. ? ?Subjective:  ? ? ?Ann Griffith is a 71 y.o. female patient of Donella Stade, PA-C who had a [employer] Visit today via telephone. Ann Griffith is Retired and lives with their spouse. she has 1 child. she reports that she is socially active and does interact with friends/family regularly. she is moderately physically active and enjoys traveling, puzzles and movies. ? ?Patient Care Team: ?Lavada Mesi as PCP - General (Family Medicine) ?Megan Salon, MD as Consulting Physician (Gynecology) ?Darius Bump, [facility] as Pharmacist (Pharmacist) ? ? ?  12/11/2021  ?  9:03 AM 10/06/2020  ? 10:07 AM 04/12/2019  ?  9:01 AM  ?Advanced Directives  ?Does Patient Have a Medical Advance Directive? Yes Yes Yes  ?Type of Advance Directive Living will;Healthcare Power of [place];Living will Living will  ?Does patient want to make changes to medical advance directive? No - Patient declined No - Patient declined No - Patient declined  ?Copy of [place] in Chart? No - copy requested No - copy requested   ? ? ?Hospital Utilization Over the Past 12 Months: ?# of hospitalizations or ER visits: 0 ?# of surgeries: 0 ? ?Review of Systems    ?Patient reports that her overall health is unchanged  compared to last year. ? ?History obtained from chart review and the patient ? ?Patient Reported Readings (BP, Pulse, CBG, Weight, etc) ?none ? ?Pain Assessment ?Pain : No/denies pain ? ?  ? ?Current Medications & Allergies (verified) ?Allergies as of 12/11/2021   ? ?   Reactions  ? Amoxicillin Swelling  ? Gabapentin   ? dizzy  ? Atorvastatin Other (See Comments)  ? Memory loss  ? ?  ? ?  ?Medication List  ?  ? ?  ? Accurate as of Dec 11, 2021  9:19 AM. If you have any questions, ask your nurse or doctor.  ?  ?  ? ?  ? ?amLODipine 2.5 MG tablet ?Commonly known as: NORVASC ?Take 1-2 tablets (2.5-5 mg total) by mouth daily. ?What changed: how much to take ?  ?cyclobenzaprine 5 MG tablet ?Commonly known as: FLEXERIL ?Take 1 tablet (5 mg total) by mouth 3 (three) times daily as needed for muscle spasms. ?  ?diclofenac 75 MG EC tablet ?Commonly known as: VOLTAREN ?Take 1 tablet (75 mg total) by mouth 2 (two) times daily. ?  ?diclofenac Sodium 1 % Gel ?Commonly known as: VOLTAREN ?APPLY 4 GRAMS 4 TIMES DAILY TOPICALLY. TO AFFECTED JOINT. ?  ?ferrous sulfate 325 (65 FE) MG EC tablet ?TAKE 1 TABLET BY MOUTH EVERY DAY WITH BREAKFAST ?  ?FIBER PO ?Take by mouth. ?  ?fish oil-omega-3 fatty acids 1000 MG capsule ?Take 1 g by mouth daily. ?  ?lisinopril 2.5 MG tablet ?Commonly known as: ZESTRIL ?TAKE 1 TABLET BY MOUTH EVERY DAY ?  ?MAGNESIUM PO ?Take 250 mg by mouth daily.  Taking 1.5 tablets daily ?  ?metFORMIN 1000 MG tablet ?Commonly known as: GLUCOPHAGE ?TAKE 1 TABLET (1,000 MG TOTAL) BY MOUTH 2 (TWO) TIMES DAILY WITH A MEAL. ?  ?pantoprazole 40 MG tablet ?Commonly known as: PROTONIX ?Take 1 tablet (40 mg total) by mouth daily. ?  ?PROBIOTIC PO ?Take by mouth. ?  ?rOPINIRole 0.5 MG tablet ?Commonly known as: REQUIP ?TAKE 1 TABLET BY MOUTH 3 TIMES DAILY. ?  ?rosuvastatin 20 MG tablet ?Commonly known as: CRESTOR ?Take 1 tablet (20 mg total) by mouth daily. ?  ?triamcinolone cream 0.1 % ?Commonly known as: KENALOG ?APPLY TO  AFFECTED AREA TWICE A DAY ?  ?TURMERIC PO ?Take 500 mg by mouth daily. ?  ?VITAMIN B-12 PO ?Take 5,000 mcg by mouth every other day. ?  ?VITAMIN D PO ?Take 5,000 Int'l Units by mouth daily. ?  ? ?  ? ? ?History (reviewed): ?Past Medical History:  ?Diagnosis Date  ? CHOLELITHIASIS, HX OF 11/09/2007  ? Qualifier: Diagnosis of  By: [name] Mcalpine    ? DIABETES MELLITUS, TYPE II 11/09/2007  ? Qualifier: Diagnosis of  By: [name] Mcalpine    ? Diverticulosis   ? Hyperlipidemia   ? ?Past Surgical History:  ?Procedure Laterality Date  ? bone spur removed  2003  ? left heel  ? CATARACT EXTRACTION Left 02/2016  ? CHOLECYSTECTOMY    ? COLONOSCOPY  08/2003  ? ?Family History  ?Problem Relation Age of Onset  ? Diabetes Mother   ? ?Social History  ? ?Socioeconomic History  ? Marital status: Married  ?  Spouse name: Dominica Severin  ? Number of children: 1  ? Years of education: 34  ? Highest education level: High school graduate  ?Occupational History  ? Occupation: data entry clerk in receiving  ?  Comment: Retired  ?Tobacco Use  ? Smoking status: Never  ? Smokeless tobacco: Never  ?Vaping Use  ? Vaping Use: Never used  ?Substance and Sexual Activity  ? Alcohol use: Yes  ?  Alcohol/week: 0.0 - 1.0 standard drinks  ?  Comment: occasionally  ? Drug use: No  ? Sexual activity: Yes  ?  Partners: Male  ?  Birth control/protection: Post-menopausal  ?Other Topics Concern  ? Not on file  ?Social History Narrative  ? Lives with her husband. Likes to go to movies and travel. Works out 3 times a week for 1.5 hrs.   ? ?Social Determinants of Health  ? ?Financial Resource Strain: Low Risk   ? Difficulty of Paying Living Expenses: Not hard at all  ?Food Insecurity: No Food Insecurity  ? Worried About Charity fundraiser in the Last Year: Never true  ? Ran Out of Food in the Last Year: Never true  ?Transportation Needs: No Transportation Needs  ? Lack of Transportation (Medical): No  ? Lack of Transportation (Non-Medical): No  ?Physical Activity:  Sufficiently Active  ? Days of Exercise per Week: 3 days  ? Minutes of Exercise per Session: 60 min  ?Stress: No Stress Concern Present  ? Feeling of Stress : Not at all  ?Social Connections: Socially Isolated  ? Frequency of Communication with Friends and Family: Once a week  ? Frequency of Social Gatherings with Friends and Family: Once a week  ? Attends Religious Services: Never  ? Active Member of Clubs or Organizations: No  ? Attends Archivist Meetings: Never  ? Marital Status: Married  ? ? ?Activities of Daily Living ? ?  12/11/2021  ?  9:07 AM  ?In your present state of health, do you have any difficulty performing the following activities:  ?Hearing? 0  ?Vision? 0  ?Difficulty concentrating or making decisions? 1  ?Comment has noticed some short term memory loss  ?Walking or climbing stairs? 0  ?Dressing or bathing? 0  ?Doing errands, shopping? 0  ?Preparing Food and eating ? N  ?Using the Toilet? N  ?In the past six months, have you accidently leaked urine? N  ?Do you have problems with loss of bowel control? Y  ?Comment only when she has diarrhea; she has been diagnosed with IBS.  ?Managing your Medications? N  ?Managing your Finances? N  ?Housekeeping or managing your Housekeeping? N  ? ? ?Patient Education/ Literacy ?How often do you need to have someone help you when you read instructions, pamphlets, or other written materials from your doctor or pharmacy?: 1 - Never ?What is the last grade level you completed in school?: 12th grade ? ?Exercise ?Current Exercise Habits: Home exercise routine, Type of exercise: walking, Time (Minutes): 60, Frequency (Times/Week): 3, Weekly Exercise (Minutes/Week): 180, Intensity: Moderate, Exercise limited by: None identified ? ?Diet ?Patient reports consuming 3 meals a day and 1 snack(s) a day ?Patient reports that her primary diet is: Regular ?Patient reports that she does have regular access to food.  ? ?Depression Screen ? ?  12/11/2021  ?  9:04 AM 10/06/2020   ? 10:07 AM 06/13/2020  ?  8:55 AM 01/27/2019  ?  8:58 AM 10/26/2018  ?  8:10 AM 02/02/2018  ?  8:13 AM 06/24/2017  ?  8:36 AM  ?PHQ 2/9 Scores  ?PHQ - 2 Score 0 0 0 0 0 0 0  ?PHQ- 9 Score  0 0 0 0 0   ?  ? ?Fall Risk

## 2021-12-24 ENCOUNTER — Ambulatory Visit (INDEPENDENT_AMBULATORY_CARE_PROVIDER_SITE_OTHER): Payer: Medicare HMO | Admitting: Physician Assistant

## 2021-12-24 ENCOUNTER — Encounter: Payer: Self-pay | Admitting: Physician Assistant

## 2021-12-24 VITALS — BP 132/69 | HR 62 | Ht 66.5 in | Wt 165.0 lb

## 2021-12-24 DIAGNOSIS — E118 Type 2 diabetes mellitus with unspecified complications: Secondary | ICD-10-CM | POA: Diagnosis not present

## 2021-12-24 DIAGNOSIS — N1831 Chronic kidney disease, stage 3a: Secondary | ICD-10-CM | POA: Diagnosis not present

## 2021-12-24 DIAGNOSIS — K580 Irritable bowel syndrome with diarrhea: Secondary | ICD-10-CM | POA: Diagnosis not present

## 2021-12-24 LAB — POCT GLYCOSYLATED HEMOGLOBIN (HGB A1C): Hemoglobin A1C: 6.4 % — AB (ref 4.0–5.6)

## 2021-12-24 NOTE — Progress Notes (Signed)
? ?Established Patient Office Visit ? ?Subjective   ?Patient ID: Ann Griffith, female    DOB: 11-04-1950  Age: 71 y.o. MRN: [id number] ? ?Chief Complaint  ?Patient presents with  ? Diabetes  ? ? ?HPI ?Pt is a 71 yo female with HTN, T2DM, IBS-D, CKD-3, HLD who presents to the clinic for 3 month follow up.  ? ?Pt is doing well. Her BP has been elevated with some recent BP checks but then always come back down. Her diarrhea has improved with stopping probiotic. She continues on metformin. No CP, palpitations, headaches, vision changes. Not checking sugars regularly. No hypoglycemic events. No open sores or wounds.  ? ?.. ?Active Ambulatory Problems  ?  Diagnosis Date Noted  ? Type 2 diabetes mellitus with complication, without long-term current use of insulin ([place]) 11/09/2007  ? SCHATZKI'S RING 11/09/2007  ? CHOLELITHIASIS, HX OF 11/09/2007  ? Diverticulosis 06/07/2013  ? Raynaud's syndrome 08/09/2013  ? BPPV (benign paroxysmal positional vertigo), right 11/02/2014  ? Microalbuminuria 11/02/2014  ? Hyperlipidemia LDL goal <70 11/03/2014  ? Seborrheic dermatitis 03/14/2015  ? Memory changes 05/05/2015  ? Insomnia 08/08/2015  ? Bilateral leg pain 08/08/2015  ? Shoulder bursitis 08/08/2015  ? Avitaminosis D 10/09/2012  ? Colon polyp 04/24/2011  ? RLS (restless legs syndrome) 03/29/2016  ? Chronic right shoulder pain 04/01/2016  ? Cataract cortical, senile, left 06/04/2016  ? Elevated serum creatinine 12/10/2016  ? Primary osteoarthritis of both knees 06/26/2017  ? Carpal tunnel syndrome, right 06/26/2017  ? Dry skin dermatitis 06/26/2017  ? Fear of flying 09/28/2017  ? Orthostatic hypotension 12/16/2017  ? Syncope and collapse 12/16/2017  ? Mitral regurgitation 12/19/2017  ? Tricuspid regurgitation 12/19/2017  ? Raynaud's phenomenon without gangrene 02/03/2018  ? Concussion 12/08/2017  ? Essential hypertension 12/10/2017  ? Combined hyperlipidemia associated with type 2 diabetes mellitus ([place]) 04/24/2011  ? Itching  04/27/2018  ? Chronic pain of both ankles 04/27/2018  ? Bilateral carpal tunnel syndrome 04/27/2018  ? Statin intolerance 07/03/2018  ? Leg cramping 09/03/2019  ? Toe pain, right 12/01/2019  ? Degenerative disc disease, cervical 12/07/2019  ? Myalgia 03/14/2020  ? Iron deficiency anemia 06/13/2020  ? Facet arthritis, degenerative, lumbar spine 06/28/2020  ? Acute left-sided low back pain with left-sided sciatica 06/28/2020  ? DDD (degenerative disc disease), lumbar 12/12/2020  ? 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 03/13/2021  ? Stage 3a chronic kidney disease ([place]) 03/14/2021  ? Squamous cell carcinoma in situ 03/19/2021  ? Gastroesophageal reflux disease without esophagitis 06/13/2021  ? Change in consistency of stool 06/13/2021  ? Left lumbar radiculitis 07/31/2021  ? Irritable bowel syndrome with diarrhea 12/24/2021  ? ?Resolved Ambulatory Problems  ?  Diagnosis Date Noted  ? Eustachian tube dysfunction 05/02/2014  ? Tennis elbow syndrome 03/29/2016  ? Right shoulder pain 03/29/2016  ? Tricompartment osteoarthritis of knees, bilateral 06/25/2017  ? Trigger finger, left ring finger 06/26/2017  ? Controlled type 2 diabetes mellitus without complication, without long-term current use of insulin ([place]) 06/26/2017  ? Trochanteric bursitis 06/27/2017  ? Flatulence 09/28/2017  ? Nausea 12/16/2017  ? Hematoma and contusion 12/16/2017  ? Paronychia of great toe, left 02/07/2020  ? Ingrown toenail of right foot 03/14/2020  ? Osteoarthritis of patellofemoral joints, bilateral 03/14/2021  ? ?Past Medical History:  ?Diagnosis Date  ? DIABETES MELLITUS, TYPE II 11/09/2007  ? Hyperlipidemia   ? ? ? ?Review of Systems  ?All other systems reviewed and are negative. ? ?  ?Objective:  ?  ? ?  BP 132/69   Pulse 62   Ht 5' 6.5" (1.689 m)   Wt 165 lb (74.8 kg)   LMP 08/12/2000   SpO2 100%   BMI 26.23 kg/m?  ?BP Readings from Last 3 Encounters:  ?12/24/21 132/69  ?11/20/21 136/77  ?09/17/21 136/77  ? ?  ? ?Physical Exam ?Vitals  reviewed.  ?Constitutional:   ?   Appearance: Normal appearance.  ?HENT:  ?   Head: Normocephalic.  ?Neck:  ?   Vascular: No carotid bruit.  ?Cardiovascular:  ?   Rate and Rhythm: Normal rate and regular rhythm.  ?   Pulses: Normal pulses.  ?   Heart sounds: Murmur heard.  ?Pulmonary:  ?   Effort: Pulmonary effort is normal.  ?   Breath sounds: Normal breath sounds.  ?Musculoskeletal:  ?   Right lower leg: No edema.  ?   Left lower leg: No edema.  ?Lymphadenopathy:  ?   Cervical: No cervical adenopathy.  ?Neurological:  ?   General: No focal deficit present.  ?   Mental Status: She is alert and oriented to person, place, and time.  ?Psychiatric:     ?   Mood and Affect: Mood normal.  ? ? ? ?Results for orders placed or performed in visit on 12/24/21  ?POCT glycosylated hemoglobin (Hb A1C)  ?Result Value Ref Range  ? Hemoglobin A1C 6.4 (A) 4.0 - 5.6 %  ? HbA1c POC (<> result, manual entry)    ? HbA1c, POC (prediabetic range)    ? HbA1c, POC (controlled diabetic range)    ? ? ?  ?Assessment & Plan:  ?..Berta was seen today for diabetes. ? ?Diagnoses and all orders for this visit: ? ?Type 2 diabetes mellitus with complication, without long-term current use of insulin (HCC) ?-     POCT glycosylated hemoglobin (Hb A1C) ? ?Stage 3a chronic kidney disease ([place]) ? ?Irritable bowel syndrome with diarrhea ? ? ?A1C is up some.  ?Continue on metformin '1000mg'$  bid ?Evaluate diet for any sugars and carbs that could be limited ?BP looks good, on ACE ?On statin ?Eye and foot exam UTD ?Covid x2, declines any boosters ?Pneumonia/flu/shingrix UTD ?Follow up in 3 months  ? ?IBS-D- resolved.  ? ? ? ? ?Iran Planas, PA-C ? ?

## 2021-12-26 ENCOUNTER — Ambulatory Visit (INDEPENDENT_AMBULATORY_CARE_PROVIDER_SITE_OTHER): Payer: Medicare HMO | Admitting: Sports Medicine

## 2021-12-26 ENCOUNTER — Ambulatory Visit: Payer: Self-pay

## 2021-12-26 ENCOUNTER — Encounter: Payer: Self-pay | Admitting: Sports Medicine

## 2021-12-26 DIAGNOSIS — M5030 Other cervical disc degeneration, unspecified cervical region: Secondary | ICD-10-CM | POA: Diagnosis not present

## 2021-12-26 DIAGNOSIS — M25511 Pain in right shoulder: Secondary | ICD-10-CM | POA: Diagnosis not present

## 2021-12-26 DIAGNOSIS — G8929 Other chronic pain: Secondary | ICD-10-CM

## 2021-12-26 NOTE — Assessment & Plan Note (Addendum)
I did a subacromial injection about 9 months ago and impingement type symptoms continue to do well. ?

## 2021-12-26 NOTE — Assessment & Plan Note (Signed)
There is also multilevel cervical degenerative disc disease worst at the C6-C7 level on x-rays, today her pain is predominantly paracervical and right periscapular with radiation down the right arm in nearly a C7 distribution. ?Today she requested trigger point injections so we did those today x3, I added cervical conditioning, I like to see her back in a month if not better we will proceed with MRI for epidural planning. ?

## 2021-12-26 NOTE — Progress Notes (Signed)
? ? ?  Procedures performed today:   ? ?Procedure:  Injection of right trapezial and paracervical trigger points x3 ?Consent obtained and verified. ?Time-out conducted. ?Noted no overlying erythema, induration, or other signs of local infection. ?Skin prepped in a sterile fashion. ?Topical analgesic spray: Ethyl chloride. ?Completed without difficulty. ?Meds: A total of 1 cc kenalog 40, 1 cc bupivacaine and 1 cc lidocaine injected and spread between the 3 trigger points. ?Advised to call if fevers/chills, erythema, induration, drainage, or persistent bleeding. ? ?Independent interpretation of notes and tests performed by another provider:  ? ?None. ? ?Brief History, Exam, Impression, and Recommendations:   ? ?Chronic right shoulder pain ?I did a subacromial injection about 9 months ago and impingement type symptoms continue to do well. ? ?Degenerative disc disease, cervical ?There is also multilevel cervical degenerative disc disease worst at the C6-C7 level on x-rays, today her pain is predominantly paracervical and right periscapular with radiation down the right arm in nearly a C7 distribution. ?Today she requested trigger point injections so we did those today x3, I added cervical conditioning, I like to see her back in a month if not better we will proceed with MRI for epidural planning. ? ?Chronic process with exacerbation and pharmacologic intervention ? ?___________________________________________ ?Gwen Her. Dianah Field, M.D., ABFM., CAQSM. ?Primary Care and Sports Medicine ?[place] ? ?Adjunct Instructor of Family Medicine  ?[employer] ?

## 2021-12-26 NOTE — Addendum Note (Signed)
Addended by: Silverio Decamp on: 12/26/2021 09:31 AM ? ? Modules accepted: Orders ? ?

## 2022-01-05 ENCOUNTER — Encounter: Payer: Self-pay | Admitting: Physician Assistant

## 2022-01-14 ENCOUNTER — Ambulatory Visit (INDEPENDENT_AMBULATORY_CARE_PROVIDER_SITE_OTHER): Payer: Medicare HMO | Admitting: Physician Assistant

## 2022-01-14 ENCOUNTER — Encounter: Payer: Self-pay | Admitting: Physician Assistant

## 2022-01-14 VITALS — BP 112/65 | HR 98 | Ht 66.0 in | Wt 161.0 lb

## 2022-01-14 DIAGNOSIS — R413 Other amnesia: Secondary | ICD-10-CM | POA: Diagnosis not present

## 2022-01-14 DIAGNOSIS — G3184 Mild cognitive impairment, so stated: Secondary | ICD-10-CM | POA: Diagnosis not present

## 2022-01-14 NOTE — Patient Instructions (Addendum)
Start baby '81mg'$  ASA Stop crestor and amlodipine Get labs Get MRI Follow up in 1 month  Memory Compensation Strategies  Use "WARM" strategy.  W= write it down  A= associate it  R= repeat it  M= make a mental note  2.   You can keep a Social worker.  Use a 3-ring notebook with sections for the following: calendar, important names and phone numbers,  medications, doctors' names/phone numbers, lists/reminders, and a section to journal what you did  each day.   3.    Use a calendar to write appointments down.  4.    Write yourself a schedule for the day.  This can be placed on the calendar or in a separate section of the Memory Notebook.  Keeping a  regular schedule can help memory.  5.    Use medication organizer with sections for each day or morning/evening pills.  You may need help loading it  6.    Keep a basket, or pegboard by the door.  Place items that you need to take out with you in the basket or on the pegboard.  You may also want to  include a message board for reminders.  7.    Use sticky notes.  Place sticky notes with reminders in a place where the task is performed.  For example: " turn off the  stove" placed by the stove, "lock the door" placed on the door at eye level, " take your medications" on  the bathroom mirror or by the place where you normally take your medications.  8.    Use alarms/timers.  Use while cooking to remind yourself to check on food or as a reminder to take your medicine, or as a  reminder to make a call, or as a reminder to perform another task, etc.

## 2022-01-14 NOTE — Progress Notes (Unsigned)
Established Patient Office Visit  Subjective   Patient ID: Ann Griffith, female    DOB: 1951/06/29  Age: 71 y.o. MRN: [id number]  No chief complaint on file.   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 03/13/2021   DDD (degenerative disc disease), lumbar 12/12/2020   Facet arthritis, degenerative, lumbar spine 06/28/2020   Acute left-sided low back pain with left-sided sciatica 06/28/2020   Iron deficiency anemia 06/13/2020   Myalgia 03/14/2020   Degenerative disc disease, cervical 12/07/2019   Toe pain, right 12/01/2019   Leg cramping 09/03/2019   Statin intolerance 07/03/2018   Itching 04/27/2018   Chronic pain of both ankles 04/27/2018   Bilateral carpal tunnel syndrome 04/27/2018   Raynaud's phenomenon without gangrene 02/03/2018   Mitral regurgitation 12/19/2017   Tricuspid regurgitation 12/19/2017   Orthostatic hypotension 12/16/2017   Syncope and collapse 12/16/2017   Essential hypertension 12/10/2017   Concussion 12/08/2017   Fear of flying 09/28/2017   Primary osteoarthritis of both knees 06/26/2017   Carpal tunnel syndrome, right 06/26/2017   Dry skin dermatitis 06/26/2017   Elevated serum creatinine 12/10/2016   Cataract cortical, senile, left 06/04/2016   Chronic right shoulder pain 04/01/2016   RLS (restless legs syndrome) 03/29/2016   Insomnia 08/08/2015   Bilateral leg pain 08/08/2015   Shoulder bursitis 08/08/2015   Memory changes 05/05/2015   Seborrheic dermatitis 03/14/2015   Hyperlipidemia  LDL goal <70 11/03/2014   BPPV (benign paroxysmal positional vertigo), right 11/02/2014   Microalbuminuria 11/02/2014   Raynaud's syndrome 08/09/2013   Diverticulosis 06/07/2013   Avitaminosis D 10/09/2012   Colon polyp 04/24/2011   Combined hyperlipidemia associated with type 2 diabetes mellitus ([place]) 04/24/2011   Type 2 diabetes mellitus with complication, without long-term current use of insulin ([place]) 11/09/2007   SCHATZKI'S RING 11/09/2007   CHOLELITHIASIS, HX OF 11/09/2007   Past Medical History:  Diagnosis Date   CHOLELITHIASIS, HX OF 11/09/2007   Qualifier: Diagnosis of  By: Smith NCMA, Seth Ward, TYPE II 11/09/2007   Qualifier: Diagnosis of  By: [name] Mcalpine     Diverticulosis    Hyperlipidemia    Family History  Problem Relation Age of Onset   Diabetes Mother    Allergies  Allergen Reactions   Amoxicillin Swelling   Gabapentin     dizzy   Atorvastatin Other (See Comments)    Memory loss      ROS    Objective:     LMP 08/12/2000  BP Readings from Last 3 Encounters:  01/14/22 112/65  12/24/21 132/69  11/20/21 136/77   Wt Readings from Last 3 Encounters:  01/14/22 161 lb (73 kg)  12/24/21 165 lb (74.8 kg)  09/17/21 162 lb (73.5 kg)      ..    09/01/2019    9:05 AM  MMSE - Mini Mental State Exam  Orientation to time 5  Orientation to Place 5  Registration 3  Attention/ Calculation 5  Recall 3  Language- name  2 objects 2  Language- repeat 1  Language- follow 3 step command 3  Language- read & follow direction 1  Write a sentence 1  Copy design 1  Total score 30     Physical Exam   No results found for any visits on 01/14/22.  Last lipids Lab Results  Component Value Date   CHOL 174 09/17/2021   HDL 76 09/17/2021   LDLCALC 82 09/17/2021   TRIG 75 09/17/2021   CHOLHDL 2.3 09/17/2021      The 10-year ASCVD risk score (Arnett DK, et al., 2019) is: 27.7%    Assessment & Plan:   No follow-ups on file.     Iran Planas, PA-C

## 2022-01-15 NOTE — Progress Notes (Signed)
B12 is too high. Cut your b12 supplement in half.  Inflammation rate is normal.  Folate is great.  Hemoglobin is just a tad low. Will get serum iron and ferritin added on to labs.  How much iron are you taking?  Kidney function dropped quite a bit. Your kidneys look dry. You need to drink more fluids aka water.  Recheck kidney function in 2 weeks.   High b12 can cause some memory issues as well.

## 2022-01-17 ENCOUNTER — Ambulatory Visit (INDEPENDENT_AMBULATORY_CARE_PROVIDER_SITE_OTHER): Payer: Medicare HMO | Admitting: Pharmacist

## 2022-01-17 DIAGNOSIS — E118 Type 2 diabetes mellitus with unspecified complications: Secondary | ICD-10-CM

## 2022-01-17 DIAGNOSIS — E785 Hyperlipidemia, unspecified: Secondary | ICD-10-CM

## 2022-01-17 DIAGNOSIS — I10 Essential (primary) hypertension: Secondary | ICD-10-CM

## 2022-01-17 LAB — CBC
HCT: 33.5 % — ABNORMAL LOW (ref 35.0–45.0)
Hemoglobin: 11.5 g/dL — ABNORMAL LOW (ref 11.7–15.5)
MCH: 33 pg (ref 27.0–33.0)
MCHC: 34.3 g/dL (ref 32.0–36.0)
MCV: 96.3 fL (ref 80.0–100.0)
MPV: 10.1 fL (ref 7.5–12.5)
Platelets: 247 10*3/uL (ref 140–400)
RBC: 3.48 10*6/uL — ABNORMAL LOW (ref 3.80–5.10)
RDW: 12.1 % (ref 11.0–15.0)
WBC: 6.6 10*3/uL (ref 3.8–10.8)

## 2022-01-17 LAB — FOLATE: Folate: 15.7 ng/mL

## 2022-01-17 LAB — RPR: RPR Ser Ql: NONREACTIVE

## 2022-01-17 LAB — IRON,TIBC AND FERRITIN PANEL
%SAT: 27 % (calc) (ref 16–45)
Ferritin: 119 ng/mL (ref 16–288)
Iron: 83 ug/dL (ref 45–160)
TIBC: 302 mcg/dL (calc) (ref 250–450)

## 2022-01-17 LAB — BASIC METABOLIC PANEL WITH GFR
BUN/Creatinine Ratio: 15 (calc) (ref 6–22)
BUN: 22 mg/dL (ref 7–25)
CO2: 24 mmol/L (ref 20–32)
Calcium: 9.4 mg/dL (ref 8.6–10.4)
Chloride: 103 mmol/L (ref 98–110)
Creat: 1.44 mg/dL — ABNORMAL HIGH (ref 0.60–1.00)
Glucose, Bld: 108 mg/dL — ABNORMAL HIGH (ref 65–99)
Potassium: 4.5 mmol/L (ref 3.5–5.3)
Sodium: 136 mmol/L (ref 135–146)
eGFR: 39 mL/min/{1.73_m2} — ABNORMAL LOW (ref >=60)

## 2022-01-17 LAB — TSH: TSH: 2.62 mIU/L (ref 0.40–4.50)

## 2022-01-17 LAB — VITAMIN B12: Vitamin B-12: >2000 pg/mL — ABNORMAL HIGH (ref 200–1100)

## 2022-01-17 LAB — SEDIMENTATION RATE: Sed Rate: 14 mm/h (ref 0–30)

## 2022-01-17 NOTE — Patient Instructions (Signed)
Visit Information  Thank you for taking time to visit with me today. Please don't hesitate to contact me if I can be of assistance to you before our next scheduled telephone appointment.  Following are the goals we discussed today:  Patient Goals/Self-Care Activities Over the next 180 days, patient will:  take medications as prescribed  Follow Up Plan: Telephone follow up appointment with care management team member scheduled for: 6 months  Please call the care guide team at [phone] if you need to cancel or reschedule your appointment.   Patient verbalizes understanding of instructions and care plan provided today and agrees to view in MyChart. Active MyChart status and patient understanding of how to access instructions and care plan via MyChart confirmed with patient.     Ann Griffith  

## 2022-01-17 NOTE — Progress Notes (Signed)
Serum iron looks pretty good and iron stores are good.

## 2022-01-17 NOTE — Progress Notes (Signed)
Chronic Care Management Pharmacy Note  01/17/2022 Name:  Melyssa Signor MRN:  [id number] DOB:  [birth year]  Summary: addressed DM, HTN, HLD. Statin medicine recently discontinued x1 month to see if related to memory. Also adjusting B12 to once weekly as patient has surplus based on labwork.  BP readings at home: SBP 130-140s Not checking BG at home, but controlled A1c so this is appropriate.  Recommendations/Changes made from today's visit: Recommended reduce B12 to once weekly, otherwise no changes.  Plan: f/u with pharmacist in 6 months  Subjective: Jailine Lieder is a 71 y.o. year old female who is a primary patient of Alden Hipp, Royetta Car, PA-C.  The CCM team was consulted for assistance with disease management and care coordination needs.    Engaged with patient by telephone for follow up visit in response to provider referral for pharmacy case management and/or care coordination services.   Consent to Services:  The patient was given information about Chronic Care Management services, agreed to services, and gave verbal consent prior to initiation of services.  Please see initial visit note for detailed documentation.   Patient Care Team: Lavada Mesi as PCP - General (Family Medicine) Megan Salon, MD as Consulting Physician (Gynecology) Darius Bump, [facility] as Pharmacist (Pharmacist)  Recent office visits:  03/30/21 Aundria Mems MD - Seen for osteoarthritis of both knees - Triamcinolone acetonide injection given in office  - Follow up in 4 weeks  03/13/21 Iran Planas PA - Seen for diabetes - Labs ordered - Stop crestor for 2 weeks - Increase metformin to 1000 mg twice daily - lliotibial band syndrome rehab - Follow up in 3 months      Recent consult visits:  02/01/21 [facility] - Obstetrics and gynecology - Seen for diabetes - No notes available  01/04/21 Gildardo [facility]ounds [facility] - Dermatology - Seen for Carcinoma in situ of skin of other parts of face - No  notes available    Hospital visits:  None in previous 6 months  Objective:  Lab Results  Component Value Date   CREATININE 1.44 (H) 01/14/2022   CREATININE 0.94 09/17/2021   CREATININE 1.05 (H) 03/13/2021    Lab Results  Component Value Date   HGBA1C 6.4 (A) 12/24/2021   Last diabetic Eye exam:  Lab Results  Component Value Date/Time   HMDIABEYEEXA No Retinopathy 09/14/2020 12:00 AM    Last diabetic Foot exam: No results found for: "HMDIABFOOTEX"      Component Value Date/Time   CHOL 174 09/17/2021 0957   TRIG 75 09/17/2021 0957   HDL 76 09/17/2021 0957   CHOLHDL 2.3 09/17/2021 0957   VLDL 12 10/30/2015 0805   LDLCALC 82 09/17/2021 0957       Latest Ref Rng & Units 03/13/2021   12:00 AM 09/14/2020    8:07 AM 09/01/2019    9:24 AM  Hepatic Function  Total Protein 6.1 - 8.1 g/dL 7.2  6.8  6.9   AST 10 - 35 U/L '18  17  19   '$ ALT 6 - 29 U/L '18  15  23   '$ Total Bilirubin 0.2 - 1.2 mg/dL 0.5  0.3  0.4     Lab Results  Component Value Date/Time   TSH 2.62 01/14/2022 12:00 AM   TSH 2.98 09/14/2020 08:07 AM       Latest Ref Rng & Units 01/14/2022   12:00 AM 03/13/2021   12:00 AM 09/14/2020    8:07 AM  CBC  WBC 3.8 -  10.8 Thousand/uL 6.6  5.5  5.0   Hemoglobin 11.7 - 15.5 g/dL 11.5  11.9  11.4   Hematocrit 35.0 - 45.0 % 33.5  36.7  33.1   Platelets 140 - 400 Thousand/uL 247  221  236     Lab Results  Component Value Date/Time   VD25OH 29 (L) 11/02/2014 10:42 AM   VD25OH 33 04/23/2013 02:19 PM    Clinical ASCVD:  The 10-year ASCVD risk score (Arnett DK, et al., 2019) is: 20.8%   Values used to calculate the score:     Age: 71 years     Sex: Female     Is Non-Hispanic African American: No     Diabetic: Yes     Tobacco smoker: No     Systolic Blood Pressure: 650 mmHg     Is BP treated: Yes     HDL Cholesterol: 76 mg/dL     Total Cholesterol: 174 mg/dL    Social History   Tobacco Use  Smoking Status Never  Smokeless Tobacco Never   BP Readings from Last  3 Encounters:  01/14/22 112/65  12/24/21 132/69  11/20/21 136/77   Pulse Readings from Last 3 Encounters:  01/14/22 98  12/24/21 62  11/20/21 79   Wt Readings from Last 3 Encounters:  01/14/22 161 lb (73 kg)  12/24/21 165 lb (74.8 kg)  09/17/21 162 lb (73.5 kg)    Assessment: Review of patient past medical history, allergies, medications, health status, including review of consultants reports, laboratory and other test data, was performed as part of comprehensive evaluation and provision of chronic care management services.   SDOH:  (Social Determinants of Health) assessments and interventions performed:    CCM Care Plan  Allergies  Allergen Reactions   Amoxicillin Swelling   Gabapentin     dizzy   Atorvastatin Other (See Comments)    Memory loss    Medications Reviewed Today     Reviewed by Donella Stade, PA-C (Physician Assistant) on 01/14/22 at 1406  Med List Status: <None>   Medication Order Taking? Sig Documenting Provider Last Dose Status Informant  amLODipine (NORVASC) 2.5 MG tablet [id number] No Take 1-2 tablets (2.5-5 mg total) by mouth daily.  Patient taking differently: Take 2.5 mg by mouth daily.   Donella Stade, PA-C Taking Active            Med Note Tonny Bollman Jun 14, 2021 11:17 AM) Mostly taking 1 tablet daily  Cyanocobalamin (VITAMIN B-12 PO) [id number] No Take 5,000 mcg by mouth every other day.  [provider] Taking Active   cyclobenzaprine (FLEXERIL) 5 MG tablet [id number] No Take 1 tablet (5 mg total) by mouth 3 (three) times daily as needed for muscle spasms. Hali Marry, MD Taking Active   diclofenac (VOLTAREN) 75 MG EC tablet [id number] No Take 1 tablet (75 mg total) by mouth 2 (two) times daily. Hali Marry, MD Taking Active   diclofenac Sodium (VOLTAREN) 1 % GEL [id number] No APPLY 4 GRAMS 4 TIMES DAILY TOPICALLY. TO AFFECTED JOINT. Donella Stade, PA-C Taking Active   ferrous sulfate 325 (65 FE) MG EC  tablet [id number] No TAKE 1 TABLET BY MOUTH EVERY DAY WITH BREAKFAST Breeback, Jade L, PA-C Taking Active   fish oil-omega-3 fatty acids 1000 MG capsule [id number] No Take 1 g by mouth daily. [provider] Taking Active   lisinopril (ZESTRIL) 2.5 MG tablet [id number] No TAKE 1 TABLET BY MOUTH EVERY DAY  Lavada Mesi Taking Active   MAGNESIUM PO [id number] No Take 250 mg by mouth daily. Taking 1.5 tablets daily [provider] Taking Active   metFORMIN (GLUCOPHAGE) 1000 MG tablet [id number] No TAKE 1 TABLET (1,000 MG TOTAL) BY MOUTH 2 (TWO) TIMES DAILY WITH A MEAL. Donella Stade, PA-C Taking Active   pantoprazole (PROTONIX) 40 MG tablet [id number] No Take 1 tablet (40 mg total) by mouth daily. Donella Stade, PA-C Taking Active   rOPINIRole (REQUIP) 0.5 MG tablet [id number] No TAKE 1 TABLET BY MOUTH 3 TIMES DAILY. Donella Stade, PA-C Taking Active   rosuvastatin (CRESTOR) 20 MG tablet [id number] No Take 1 tablet (20 mg total) by mouth daily. Hali Marry, MD Taking Active   triamcinolone cream (KENALOG) 0.1 % [id number] No APPLY TO AFFECTED AREA TWICE A DAY Donella Stade, PA-C Taking Active   TURMERIC PO [id number] No Take 500 mg by mouth daily. [provider] Taking Active   VITAMIN D PO [id number] No Take 5,000 Int'l Units by mouth daily. 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 03/13/2021   DDD (degenerative disc disease), lumbar 12/12/2020   Facet arthritis,  degenerative, lumbar spine 06/28/2020   Acute left-sided low back pain with left-sided sciatica 06/28/2020   Iron deficiency anemia 06/13/2020   Myalgia 03/14/2020   Degenerative disc disease, cervical 12/07/2019   Toe pain, right 12/01/2019   Leg cramping 09/03/2019   Statin intolerance 07/03/2018   Itching 04/27/2018   Chronic pain of both ankles 04/27/2018   Bilateral carpal tunnel syndrome 04/27/2018   Raynaud's phenomenon without gangrene 02/03/2018   Mitral regurgitation 12/19/2017   Tricuspid regurgitation 12/19/2017   Orthostatic hypotension 12/16/2017   Syncope and collapse 12/16/2017   Essential hypertension 12/10/2017   Concussion 12/08/2017   Fear of flying 09/28/2017   Primary osteoarthritis of both knees 06/26/2017   Carpal tunnel syndrome, right 06/26/2017   Dry skin dermatitis 06/26/2017   Elevated serum creatinine 12/10/2016   Cataract cortical, senile, left 06/04/2016   Chronic right shoulder pain 04/01/2016   RLS (restless legs syndrome) 03/29/2016   Insomnia 08/08/2015   Bilateral leg pain 08/08/2015   Shoulder bursitis 08/08/2015   Memory changes 05/05/2015   Seborrheic dermatitis 03/14/2015   Hyperlipidemia LDL goal <70 11/03/2014   BPPV (benign paroxysmal positional vertigo), right 11/02/2014   Microalbuminuria 11/02/2014   Raynaud's syndrome 08/09/2013   Diverticulosis 06/07/2013   Avitaminosis D 10/09/2012   Colon polyp 04/24/2011   Combined hyperlipidemia associated with type 2 diabetes mellitus ([place]) 04/24/2011   Type 2 diabetes mellitus with complication, without long-term current use of insulin ([place]) 11/09/2007   SCHATZKI'S RING 11/09/2007   CHOLELITHIASIS, HX OF 11/09/2007    Immunization History  Administered Date(s) Administered   Fluad Quad(high Dose 65+) 05/31/2019, 06/13/2020   Influenza,inj,Quad PF,6+ Mos 05/02/2014, 05/05/2015, 06/04/2016, 06/24/2017, 04/27/2018   Influenza-Unspecified 06/07/2021   PFIZER(Purple Top)SARS-COV-2  Vaccination 10/05/2019, 10/26/2019, 04/28/2020   Pneumococcal Conjugate-13  05/05/2015   Pneumococcal Polysaccharide-23 06/04/2016   Tdap 12/21/2009, 10/14/2012   Zoster Recombinat (Shingrix) 11/17/2019, 03/28/2020    Conditions to be addressed/monitored: HTN, HLD, and DMII  There are no care plans that you recently modified to display for this patient.   Medication Assistance: None required.  Patient affirms current coverage meets needs.  Patient's preferred pharmacy is:  [id number] [place] [address]Phone: [phone]: [phone] [id number] [place] [id number] [id number] WE[facility] Rehabilitation HospitalIStorla[address]hone: [phone]: [phone] pill box? Yes Pt endorses 100% compliance  Follow Up:  Patient agrees to Care Plan and Follow-up.  Plan: Telephone follow up appointment with care management team member scheduled for:  6 months  KeLarinda ButteryPharmD Clinical Pharmacist CoNortheast Rehabilitation Hospitalrimary Care At MeHack[phone]

## 2022-01-18 ENCOUNTER — Other Ambulatory Visit: Payer: Self-pay | Admitting: Family Medicine

## 2022-01-18 DIAGNOSIS — G8929 Other chronic pain: Secondary | ICD-10-CM

## 2022-01-21 ENCOUNTER — Ambulatory Visit (INDEPENDENT_AMBULATORY_CARE_PROVIDER_SITE_OTHER): Payer: Medicare HMO

## 2022-01-21 DIAGNOSIS — R4182 Altered mental status, unspecified: Secondary | ICD-10-CM | POA: Diagnosis not present

## 2022-01-21 DIAGNOSIS — R413 Other amnesia: Secondary | ICD-10-CM

## 2022-01-21 DIAGNOSIS — G3184 Mild cognitive impairment, so stated: Secondary | ICD-10-CM

## 2022-01-21 MED ORDER — GADOBUTROL 1 MMOL/ML IV SOLN
7.3000 mL | Freq: Once | INTRAVENOUS | Status: AC | PRN
  Administered 2022-01-21: 7.3 mL via INTRAVENOUS

## 2022-01-22 NOTE — Progress Notes (Signed)
Ann Griffith,   No signs of infarct, hemorrhage, fluid or mass on the brain.  Mild chronic small vessel changes(decrease blood supply to some areas) Stay on statin and make sure taking ASA '81mg'$  daily.  Overall GREAT MRI.

## 2022-01-23 NOTE — Progress Notes (Signed)
I wanted you to stay off the statin to see if causing any memory issues have you been off 2 weeks. Have you noticed any improvement?

## 2022-02-07 ENCOUNTER — Other Ambulatory Visit: Payer: Self-pay | Admitting: Physician Assistant

## 2022-02-07 DIAGNOSIS — I10 Essential (primary) hypertension: Secondary | ICD-10-CM

## 2022-02-07 DIAGNOSIS — E118 Type 2 diabetes mellitus with unspecified complications: Secondary | ICD-10-CM

## 2022-02-13 ENCOUNTER — Ambulatory Visit (INDEPENDENT_AMBULATORY_CARE_PROVIDER_SITE_OTHER): Payer: Medicare HMO | Admitting: Physician Assistant

## 2022-02-13 VITALS — BP 138/62 | HR 86 | Ht 66.0 in | Wt 162.0 lb

## 2022-02-13 DIAGNOSIS — G3184 Mild cognitive impairment, so stated: Secondary | ICD-10-CM | POA: Diagnosis not present

## 2022-02-13 DIAGNOSIS — R7989 Other specified abnormal findings of blood chemistry: Secondary | ICD-10-CM

## 2022-02-13 DIAGNOSIS — R748 Abnormal levels of other serum enzymes: Secondary | ICD-10-CM

## 2022-02-13 DIAGNOSIS — E785 Hyperlipidemia, unspecified: Secondary | ICD-10-CM

## 2022-02-13 DIAGNOSIS — R944 Abnormal results of kidney function studies: Secondary | ICD-10-CM | POA: Diagnosis not present

## 2022-02-13 DIAGNOSIS — Z789 Other specified health status: Secondary | ICD-10-CM

## 2022-02-13 MED ORDER — EZETIMIBE 10 MG PO TABS: 10.0000 mg | ORAL_TABLET | Freq: Every day | ORAL | 3 refills | Status: DC

## 2022-02-13 NOTE — Progress Notes (Signed)
Established Patient Office Visit  Subjective   Patient ID: Ann Griffith, female    DOB: [birth year]  Age: 71 y.o. MRN: [id number]  Chief Complaint  Patient presents with   Memory Loss    Follow up, some better since stopping cholesterol meds    HPI Pt is a 71 yo female who presents to the clinic with husband to follow up on memory decline. Pt stopped her statin one month ago and she does report to be feeling better and memory to have improved. Overall about 10 percent. She did have MRI which was negative for any acute findings. B12 was found to be too high and she cut back to 5083mg a week. She is on iron and kidney function declined. Needs lab rechecked.   .. Active Ambulatory Problems    Diagnosis Date Noted   Type 2 diabetes mellitus with complication, without long-term current use of insulin ([place] 11/09/2007   SCHATZKI'S RING 11/09/2007   CHOLELITHIASIS, HX OF 11/09/2007   Diverticulosis 06/07/2013   Raynaud's syndrome 08/09/2013   BPPV (benign paroxysmal positional vertigo), right 11/02/2014   Microalbuminuria 11/02/2014   Hyperlipidemia LDL goal <70 11/03/2014   Seborrheic dermatitis 03/14/2015   Memory changes 05/05/2015   Insomnia 08/08/2015   Bilateral leg pain 08/08/2015   Shoulder bursitis 08/08/2015   Avitaminosis D 10/09/2012   Colon polyp 04/24/2011   RLS (restless legs syndrome) 03/29/2016   Chronic right shoulder pain 04/01/2016   Cataract cortical, senile, left 06/04/2016   Elevated serum creatinine 12/10/2016   Primary osteoarthritis of both knees 06/26/2017   Carpal tunnel syndrome, right 06/26/2017   Dry skin dermatitis 06/26/2017   Fear of flying 09/28/2017   Orthostatic hypotension 12/16/2017   Syncope and collapse 12/16/2017   Mitral regurgitation 12/19/2017   Tricuspid regurgitation 12/19/2017   Raynaud's phenomenon without gangrene 02/03/2018   Concussion 12/08/2017   Essential hypertension 12/10/2017   Combined hyperlipidemia associated  with type 2 diabetes mellitus ([place] 04/24/2011   Itching 04/27/2018   Chronic pain of both ankles 04/27/2018   Bilateral carpal tunnel syndrome 04/27/2018   Statin intolerance 07/03/2018   Leg cramping 09/03/2019   Toe pain, right 12/01/2019   Degenerative disc disease, cervical 12/07/2019   Myalgia 03/14/2020   Iron deficiency anemia 06/13/2020   Facet arthritis, degenerative, lumbar spine 06/28/2020   Acute left-sided low back pain with left-sided sciatica 06/28/2020   DDD (degenerative disc disease), lumbar 12/12/2020   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 03/13/2021   Stage 3a chronic kidney disease ([place] 03/14/2021   Squamous cell carcinoma in situ 03/19/2021   Gastroesophageal reflux disease without esophagitis 06/13/2021   Change in consistency of stool 06/13/2021   Left lumbar radiculitis 07/31/2021   Irritable bowel syndrome with diarrhea 12/24/2021   Mild cognitive impairment 01/14/2022   Elevated vitamin B12 level 02/13/2022   Decreased GFR 02/13/2022   Resolved Ambulatory Problems    Diagnosis Date Noted   Eustachian tube dysfunction 05/02/2014   Tennis elbow syndrome 03/29/2016   Right shoulder pain 03/29/2016   Tricompartment osteoarthritis of knees, bilateral 06/25/2017   Trigger finger, left ring finger 06/26/2017   Controlled type 2 diabetes mellitus without complication, without long-term current use of insulin ([facility] [place] 06/26/2017   Trochanteric bursitis 06/27/2017   Flatulence 09/28/2017   Nausea 12/16/2017   Hematoma and contusion 12/16/2017   Paronychia of great toe, left 02/07/2020   Ingrown toenail of right foot 03/14/2020   Osteoarthritis of patellofemoral joints, bilateral 03/14/2021   Past  Medical History:  Diagnosis Date   DIABETES MELLITUS, TYPE II 11/09/2007   Hyperlipidemia      ROS See HPI.    Objective:     BP 138/62   Pulse 86   Ht '5\' 6"'$  (1.676 m)   Wt 162 lb (73.5 kg)   LMP 08/12/2000   SpO2 99%   BMI 26.15 kg/m  BP Readings  from Last 3 Encounters:  02/13/22 138/62  01/14/22 112/65  12/24/21 132/69   Wt Readings from Last 3 Encounters:  02/13/22 162 lb (73.5 kg)  01/14/22 161 lb (73 kg)  12/24/21 165 lb (74.8 kg)      Physical Exam Constitutional:      Appearance: Normal appearance.  HENT:     Head: Normocephalic.  Cardiovascular:     Rate and Rhythm: Normal rate and regular rhythm.     Pulses: Normal pulses.  Pulmonary:     Effort: Pulmonary effort is normal.     Breath sounds: Normal breath sounds.  Musculoskeletal:     Right lower leg: No edema.     Left lower leg: No edema.  Neurological:     General: No focal deficit present.     Mental Status: She is alert and oriented to person, place, and time.  Psychiatric:        Mood and Affect: Mood normal.        The 10-year ASCVD risk score (Arnett DK, et al., 2019) is: 29.9%    Assessment & Plan:  [name] was seen today for memory loss.  Diagnoses and all orders for this visit:  Mild cognitive impairment -     Ambulatory referral to Neurology  Decreased GFR -     BASIC METABOLIC PANEL WITH GFR  Elevated vitamin B12 level -     B12  Statin intolerance -     ezetimibe (ZETIA) 10 MG tablet; Take 1 tablet (10 mg total) by mouth daily. For cholesterol. -     Alirocumab (PRALUENT) 150 MG/ML SOAJ; Inject 150 mg into the skin every 14 (fourteen) days.  Hyperlipidemia LDL goal <70 -     ezetimibe (ZETIA) 10 MG tablet; Take 1 tablet (10 mg total) by mouth daily. For cholesterol. -     Alirocumab (PRALUENT) 150 MG/ML SOAJ; Inject 150 mg into the skin every 14 (fourteen) days.   Pt's memory and how she feels is doing better off statin Stay off for now CV risk is very high Trial of praulent every 14 days And  Zetia daily Recheck labs in 6 months  Will make referral to neurologist for memory Discussed aricept/namenda declined for now Reassured MRI looked good  Recheck b12 today.  Recheck CMP for kidney function   Ann Planas, PA-C

## 2022-02-13 NOTE — Patient Instructions (Signed)
Start zetia for cholesterol.  Recheck kidney function and B12. Will make referral to neurologist.

## 2022-02-14 LAB — BASIC METABOLIC PANEL WITH GFR
BUN/Creatinine Ratio: 18 (calc) (ref 6–22)
BUN: 18 mg/dL (ref 7–25)
CO2: 23 mmol/L (ref 20–32)
Calcium: 9.8 mg/dL (ref 8.6–10.4)
Chloride: 103 mmol/L (ref 98–110)
Creat: 1.01 mg/dL — ABNORMAL HIGH (ref 0.60–1.00)
Glucose, Bld: 152 mg/dL — ABNORMAL HIGH (ref 65–99)
Potassium: 4.3 mmol/L (ref 3.5–5.3)
Sodium: 137 mmol/L (ref 135–146)
eGFR: 59 mL/min/{1.73_m2} — ABNORMAL LOW (ref >=60)

## 2022-02-14 LAB — VITAMIN B12: Vitamin B-12: 464 pg/mL (ref 200–1100)

## 2022-02-14 NOTE — Progress Notes (Signed)
B12 in normal range now. I would stay at twice a week b12 dosing.   Kidney function MUCH better than last recheck back to your baseline.

## 2022-02-18 ENCOUNTER — Encounter: Payer: Self-pay | Admitting: Physician Assistant

## 2022-02-18 MED ORDER — PRALUENT 150 MG/ML ~~LOC~~ SOAJ: 150.0000 mg | SUBCUTANEOUS | 5 refills | Status: DC

## 2022-03-04 ENCOUNTER — Other Ambulatory Visit: Payer: Self-pay | Admitting: Physician Assistant

## 2022-03-04 DIAGNOSIS — E118 Type 2 diabetes mellitus with unspecified complications: Secondary | ICD-10-CM

## 2022-03-05 DIAGNOSIS — M5451 Vertebrogenic low back pain: Secondary | ICD-10-CM | POA: Diagnosis not present

## 2022-03-12 DIAGNOSIS — M5451 Vertebrogenic low back pain: Secondary | ICD-10-CM | POA: Diagnosis not present

## 2022-03-19 DIAGNOSIS — M5451 Vertebrogenic low back pain: Secondary | ICD-10-CM | POA: Diagnosis not present

## 2022-03-26 ENCOUNTER — Other Ambulatory Visit: Payer: Self-pay | Admitting: Physician Assistant

## 2022-03-26 ENCOUNTER — Ambulatory Visit (INDEPENDENT_AMBULATORY_CARE_PROVIDER_SITE_OTHER): Payer: Medicare HMO | Admitting: Physician Assistant

## 2022-03-26 VITALS — BP 148/76 | HR 68 | Ht 66.0 in | Wt 161.0 lb

## 2022-03-26 DIAGNOSIS — R944 Abnormal results of kidney function studies: Secondary | ICD-10-CM | POA: Diagnosis not present

## 2022-03-26 DIAGNOSIS — I10 Essential (primary) hypertension: Secondary | ICD-10-CM

## 2022-03-26 DIAGNOSIS — E118 Type 2 diabetes mellitus with unspecified complications: Secondary | ICD-10-CM

## 2022-03-26 DIAGNOSIS — E1122 Type 2 diabetes mellitus with diabetic chronic kidney disease: Secondary | ICD-10-CM

## 2022-03-26 DIAGNOSIS — E785 Hyperlipidemia, unspecified: Secondary | ICD-10-CM | POA: Diagnosis not present

## 2022-03-26 DIAGNOSIS — M5451 Vertebrogenic low back pain: Secondary | ICD-10-CM | POA: Diagnosis not present

## 2022-03-26 DIAGNOSIS — M79604 Pain in right leg: Secondary | ICD-10-CM

## 2022-03-26 DIAGNOSIS — N1831 Chronic kidney disease, stage 3a: Secondary | ICD-10-CM

## 2022-03-26 DIAGNOSIS — M79605 Pain in left leg: Secondary | ICD-10-CM

## 2022-03-26 DIAGNOSIS — Z789 Other specified health status: Secondary | ICD-10-CM

## 2022-03-26 DIAGNOSIS — M5136 Other intervertebral disc degeneration, lumbar region: Secondary | ICD-10-CM

## 2022-03-26 DIAGNOSIS — G2581 Restless legs syndrome: Secondary | ICD-10-CM | POA: Diagnosis not present

## 2022-03-26 LAB — POCT UA - MICROALBUMIN
Albumin/Creatinine Ratio, Urine, POC: <30
Creatinine, POC: 200 mg/dL
Microalbumin Ur, POC: 30 mg/L

## 2022-03-26 LAB — POCT GLYCOSYLATED HEMOGLOBIN (HGB A1C): HbA1c, POC (controlled diabetic range): 5.5 % (ref 0.0–7.0)

## 2022-03-26 MED ORDER — BLOOD GLUCOSE METER KIT: PACK | 0 refills | Status: AC

## 2022-03-26 MED ORDER — AMBULATORY NON FORMULARY MEDICATION: 0 refills | Status: AC

## 2022-03-26 NOTE — Patient Instructions (Addendum)
EMG for legs to be scheduled Will look into praulent injections for cholesterol and approval Sent glucometer to pharmacy Goal BP under 130/80  Piriformis Syndrome Rehab Ask your health care provider which exercises are safe for you. Do exercises exactly as told by your health care provider and adjust them as directed. It is normal to feel mild stretching, pulling, tightness, or discomfort as you do these exercises. Stop right away if you feel sudden pain or your pain gets worse. Do not begin these exercises until told by your health care provider. Stretching and range-of-motion exercises These exercises warm up your muscles and joints and improve the movement and flexibility of your hip and pelvis. The exercises also help to relieve pain, numbness, and tingling. Nerve root  Sit on a firm surface that is high enough that you can swing your left / right foot freely. Place a folded towel under your left / right thigh. This is optional. Drop your head forward and round your back. While you keep your left / right foot relaxed, slowly straighten your left / right knee until you feel a slight pull behind your knee or calf. If your leg is fully extended and you still do not feel a pull, slowly tilt your foot and toes toward you. Hold this position for __________ seconds. Slowly return your knee to its starting position. Hip rotation This is an exercise in which you lie on your back and stretch the muscles that rotate your hip (hip rotators) to stretch your buttocks. Lie on your back on a firm surface. Pull your left / right knee toward your same shoulder with your left / right hand until your knee is pointing toward the ceiling. Hold your left / right ankle with your other hand. Keeping your knee steady, gently pull your left / right ankle toward your other shoulder until you feel a stretch in your buttocks. Hold this position for __________ seconds. Repeat __________ times. Complete this exercise  __________ times a day. Hip extensor This is an exercise in which you lie on your back and pull your knee to your chest. Lie on your back on a firm surface. Both of your legs should be straight. Pull your left / right knee to your chest. Hold your leg in this position by holding on to the back of your thigh or the front of your knee. Hold this position for __________ seconds. Slowly return to the starting position. Repeat __________ times. Complete this exercise __________ times a day. Strengthening exercises These exercises build strength and endurance in your hip and thigh muscles. Endurance is the ability to use your muscles for a long time, even after they get tired. Straight leg raises, side-lying This exercise strengthens the muscles that rotate the leg at the hip and move it away from your body (hip abductors). Lie on your side with your left / right leg in the top position. Lie so your head, shoulder, knee, and hip line up. Bend your bottom knee to help you balance. Lift your top leg 4-6 inches (10-15 cm) while keeping your toes pointed straight ahead. Hold this position for __________ seconds. Slowly lower your leg to the starting position. Let your muscles relax completely after each repetition. Repeat __________ times. Complete this exercise __________ times a day. Hip abduction and rotation This is sometimes called quadruped (on hands and knees) exercises. Get on your hands and knees on a firm, lightly padded surface. Your hands should be directly below your shoulders, and your  knees should be directly below your hips. Lift your left / right knee out to the side. Keep your knee bent. Do not twist your body. Hold this position for __________ seconds. Slowly lower your leg. Repeat __________ times. Complete this exercise __________ times a day. Straight leg raises, prone This exercise stretches the muscles that move the hips (hip extensors). Lie on your abdomen on a firm surface  (prone position). Tense the muscles in your buttocks and lift your left / right leg about 4 inches (10 cm). Keep your knee straight as you lift your leg. If you cannot lift your leg that high without arching your back, place a pillow under your hips. Hold this position for __________ seconds. Slowly lower your leg to the starting position. Let your muscles relax completely after each repetition. Repeat __________ times. Complete this exercise __________ times a day. This information is not intended to replace advice given to you by your health care provider. Make sure you discuss any questions you have with your health care provider. Document Revised: 01/30/2021 Document Reviewed: 01/30/2021 Elsevier Patient Education  Ann Griffith.

## 2022-03-26 NOTE — Progress Notes (Signed)
Established Patient Office Visit  Subjective   Patient ID: Ann Griffith, female    DOB: 10-25-1950  Age: 71 y.o. MRN: [id number]  Chief Complaint  Patient presents with   Diabetes   Follow-up    HPI Pt is a 71 yo female with T2DM, CKD 3, HLD, RLS, GERD who presents to the clinic for follow up.   She is taking metformin for sugars. She is not checking sugars and needs new glucometer. She denies any CP, palpitations, headaches or vision changes. She does not have any open sores or wounds.   She is taking zetia but never heard about praulent injections. Off statin her cognition continues to improve.   She continues to have bilateral leg pain and intermittent radiating pain down right buttocks. No injury. Does not worsen with walking and not relieved by rest. Seems to be worse at night. Achy feeling. She has RLS and requip helps but does resolve the issue. She deneis any known weakness. ABI normal a little over a year ago.    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 03/13/2021   DDD (degenerative disc disease), lumbar 12/12/2020   Facet arthritis, degenerative, lumbar spine 06/28/2020   Acute left-sided low back pain with left-sided sciatica 06/28/2020   Iron deficiency anemia 06/13/2020   Myalgia 03/14/2020   Degenerative disc disease, cervical 12/07/2019   Toe pain, right 12/01/2019   Leg cramping 09/03/2019   Statin intolerance 07/03/2018   Itching 04/27/2018   Chronic pain of both ankles 04/27/2018   Bilateral carpal tunnel  syndrome 04/27/2018   Raynaud's phenomenon without gangrene 02/03/2018   Mitral regurgitation 12/19/2017   Tricuspid regurgitation 12/19/2017   Orthostatic hypotension 12/16/2017   Syncope and collapse 12/16/2017   Essential hypertension 12/10/2017   Concussion 12/08/2017   Fear of flying 09/28/2017   Primary osteoarthritis of both knees 06/26/2017   Carpal tunnel syndrome, right 06/26/2017   Dry skin dermatitis 06/26/2017   Elevated serum creatinine 12/10/2016   Cataract cortical, senile, left 06/04/2016   Chronic right shoulder pain 04/01/2016   RLS (restless legs syndrome) 03/29/2016   Insomnia 08/08/2015   Bilateral leg pain 08/08/2015   Shoulder bursitis 08/08/2015   Memory changes 05/05/2015   Seborrheic dermatitis 03/14/2015   Hyperlipidemia LDL goal <70 11/03/2014   BPPV (benign paroxysmal positional vertigo), right 11/02/2014   Microalbuminuria 11/02/2014   Raynaud's syndrome 08/09/2013   Diverticulosis 06/07/2013   Avitaminosis D 10/09/2012   Colon polyp 04/24/2011   Combined hyperlipidemia associated with type 2 diabetes mellitus ([place]) 04/24/2011   Type 2 diabetes mellitus with complication, without long-term current use of insulin ([place]) 11/09/2007   SCHATZKI'S RING 11/09/2007   CHOLELITHIASIS, HX OF 11/09/2007   Past Medical History:  Diagnosis Date   CHOLELITHIASIS, HX OF 11/09/2007   Qualifier: Diagnosis of  By: [name] Mcalpine     DIABETES MELLITUS, TYPE II 11/09/2007   Qualifier: Diagnosis of  By: [name] Mcalpine     Diverticulosis    Hyperlipidemia    Family History  Problem Relation Age of  Onset   Diabetes Mother    Allergies  Allergen Reactions   Amoxicillin Swelling   Gabapentin     dizzy   Atorvastatin Other (See Comments)    Memory loss      ROS Bilateral leg achy/cramping/restlessness   Objective:     BP (!) 148/76   Pulse 68   Ht 5' 6"  (1.676 m)   Wt 161 lb (73 kg)   LMP 08/12/2000   SpO2 99%   BMI 25.99 kg/m  BP  Readings from Last 3 Encounters:  03/26/22 (!) 148/76  02/13/22 138/62  01/14/22 112/65   Wt Readings from Last 3 Encounters:  03/26/22 161 lb (73 kg)  02/13/22 162 lb (73.5 kg)  01/14/22 161 lb (73 kg)      .[name] Lab Results  Component Value Date   HGBA1C 5.5 03/26/2022     Physical Exam Vitals reviewed.  Constitutional:      Appearance: Normal appearance.  Neck:     Vascular: No carotid bruit.  Cardiovascular:     Rate and Rhythm: Normal rate and regular rhythm.     Pulses: Normal pulses.  Pulmonary:     Effort: Pulmonary effort is normal.  Musculoskeletal:     Right lower leg: No edema.     Left lower leg: No edema.     Comments: NROM at waist and bilateral hips Strength 5/5 lower extremity Good 2+ pedal pulses bilaterally.   Neurological:     General: No focal deficit present.     Mental Status: She is oriented to person, place, and time.  Psychiatric:        Mood and Affect: Mood normal.      The 10-year ASCVD risk score (Arnett DK, et al., 2019) is: 33.6%    Assessment & Plan:   [name] was seen today for diabetes and follow-up.  Diagnoses and all orders for this visit:  Type 2 diabetes mellitus with stage 3a chronic kidney disease, without long-term current use of insulin (HCC) -     POCT glycosylated hemoglobin (Hb A1C) -     POCT UA - Microalbumin -     AMBULATORY NON FORMULARY MEDICATION; Single glucometer with lancets, test strips -     blood glucose meter kit and supplies; Dispense based on patient and insurance preference. Use up to four times daily as directed. (FOR ICD-10 E10.9, E11.9).  DDD (degenerative disc disease), lumbar  Decreased GFR  Essential hypertension  Statin intolerance  Hyperlipidemia LDL goal <70  RLS (restless legs syndrome)   A1C to goal and looks great Continue on same medications which is just metformin Glucometer sent to be able to check sugars more BP not to goal. Start checking at home. On ACE 2.75m we  could increase but BP have not normally been like this BP goal under 130/80 Recheck nurse visit in 2 weeks Did not tolerate statin-memory and cognition continue to improve off statin CV risk is high at 33 percent On zetia, would like to get praulent approved(sent last visit but patient did not hear anything)  UTD eye and foot exam.  Covid/pneumonia UTD. Needs flu shot.   Pt is continuing to have bilateral leg pain and aching.  ABI were normal about a year ago and pulses are good in feet. Pain is not brought on by walking and not relieved by rest She does have some lumbar DDD and some symptoms sound like her piriformis is tight Exercises given for low back Will get EMG for  legs  Follow up in 3 months.      Return in about 3 months (around 06/26/2022).    Iran Planas, PA-C

## 2022-03-29 ENCOUNTER — Encounter: Payer: Self-pay | Admitting: Physician Assistant

## 2022-04-02 DIAGNOSIS — M5451 Vertebrogenic low back pain: Secondary | ICD-10-CM | POA: Diagnosis not present

## 2022-04-04 DIAGNOSIS — G603 Idiopathic progressive neuropathy: Secondary | ICD-10-CM | POA: Diagnosis not present

## 2022-04-04 DIAGNOSIS — M5417 Radiculopathy, lumbosacral region: Secondary | ICD-10-CM | POA: Diagnosis not present

## 2022-04-09 DIAGNOSIS — M5451 Vertebrogenic low back pain: Secondary | ICD-10-CM | POA: Diagnosis not present

## 2022-04-16 DIAGNOSIS — M5451 Vertebrogenic low back pain: Secondary | ICD-10-CM | POA: Diagnosis not present

## 2022-04-22 MED ORDER — PRALUENT 150 MG/ML ~~LOC~~ SOAJ: SUBCUTANEOUS | 11 refills | Status: DC

## 2022-04-22 NOTE — Addendum Note (Signed)
Addended by: Donella Stade on: 04/22/2022 01:57 PM   Modules accepted: Orders

## 2022-05-06 ENCOUNTER — Encounter: Payer: Self-pay | Admitting: Physician Assistant

## 2022-05-07 DIAGNOSIS — Z1231 Encounter for screening mammogram for malignant neoplasm of breast: Secondary | ICD-10-CM | POA: Diagnosis not present

## 2022-05-07 LAB — HM MAMMOGRAPHY

## 2022-06-06 ENCOUNTER — Other Ambulatory Visit: Payer: Self-pay | Admitting: Physician Assistant

## 2022-06-06 DIAGNOSIS — K219 Gastro-esophageal reflux disease without esophagitis: Secondary | ICD-10-CM

## 2022-06-17 ENCOUNTER — Encounter: Payer: Self-pay | Admitting: Physician Assistant

## 2022-06-17 NOTE — Progress Notes (Signed)
05/07/2022 

## 2022-06-20 ENCOUNTER — Telehealth: Payer: Medicare HMO

## 2022-06-20 ENCOUNTER — Telehealth: Payer: Self-pay | Admitting: Pharmacist

## 2022-06-20 DIAGNOSIS — R413 Other amnesia: Secondary | ICD-10-CM | POA: Diagnosis not present

## 2022-06-20 NOTE — Telephone Encounter (Signed)
Unsuccessful attempt x2 to contact patient for follow-up phone call for CCM services with pharmacist.  Will route to schedule team for reschedule.  Mckaylee Dimalanta, PharmD Clinical Pharmacist [facility] [phone]   

## 2022-06-20 NOTE — Progress Notes (Deleted)
Chronic Care Management Pharmacy Note  06/20/2022 Name:  Ann Griffith MRN:  [id number] DOB:  03-31-1951  Summary: addressed DM, HTN, HLD. Statin medicine recently discontinued x1 month to see if related to memory. Also adjusting B12 to once weekly as patient has surplus based on labwork.  BP readings at home: SBP 130-140s Not checking BG at home, but controlled A1c so this is appropriate.  Recommendations/Changes made from today's visit: Recommended reduce B12 to once weekly, otherwise no changes.  Plan: f/u with pharmacist in 6 months  Subjective: Ann Griffith is an 71 y.o. year old female who is a primary patient of Ann Griffith, Ann Car, PA-C.  The CCM team was consulted for assistance with disease management and care coordination needs.    Engaged with patient by telephone for follow up visit in response to provider referral for pharmacy case management and/or care coordination services.   Consent to Services:  The patient was given information about Chronic Care Management services, agreed to services, and gave verbal consent prior to initiation of services.  Please see initial visit note for detailed documentation.   Patient Care Team: Ann Griffith as PCP - General (Family Medicine) Ann Salon, MD as Consulting Physician (Gynecology) Ann Griffith, [facility] as Pharmacist (Pharmacist)  Recent office visits:  03/30/21 Aundria Mems MD - Seen for osteoarthritis of both knees - Triamcinolone acetonide injection given in office  - Follow up in 4 weeks  03/13/21 Iran Planas PA - Seen for diabetes - Labs ordered - Stop crestor for 2 weeks - Increase metformin to 1000 mg twice daily - lliotibial band syndrome rehab - Follow up in 3 months      Recent consult visits:  02/01/21 [place] - Obstetrics and gynecology - Seen for diabetes - No notes available  01/04/21 Ann Griffith - Dermatology - Seen for Carcinoma in situ of skin of other parts of face -  No notes available    Hospital visits:  None in previous 6 months  Objective:  Lab Results  Component Value Date   CREATININE 1.01 (H) 02/13/2022   CREATININE 1.44 (H) 01/14/2022   CREATININE 0.94 09/17/2021    Lab Results  Component Value Date   HGBA1C 5.5 03/26/2022   Last diabetic Eye exam:  Lab Results  Component Value Date/Time   HMDIABEYEEXA No Retinopathy 09/14/2020 12:00 AM    Last diabetic Foot exam: No results found for: "HMDIABFOOTEX"      Component Value Date/Time   CHOL 174 09/17/2021 0957   TRIG 75 09/17/2021 0957   HDL 76 09/17/2021 0957   CHOLHDL 2.3 09/17/2021 0957   VLDL 12 10/30/2015 0805   LDLCALC 82 09/17/2021 0957       Latest Ref Rng & Units 03/13/2021   12:00 AM 09/14/2020    8:07 AM 09/01/2019    9:24 AM  Hepatic Function  Total Protein 6.1 - 8.1 g/dL 7.2  6.8  6.9   AST 10 - 35 U/L _0 ALT 6 - 29 U/L _1 Total Bilirubin 0.2 - 1.2 mg/dL 0.5  0.3  0.4     Lab Results  Component Value Date/Time   TSH 2.62 01/14/2022 12:00 AM   TSH 2.98 09/14/2020 08:07 AM       Latest Ref Rng & Units 01/14/2022   12:00 AM 03/13/2021   12:00 AM 09/14/2020    8:07 AM  CBC  WBC 3.8 -  10.8 Thousand/uL 6.6  5.5  5.0   Hemoglobin 11.7 - 15.5 g/dL 11.5  11.9  11.4   Hematocrit 35.0 - 45.0 % 33.5  36.7  33.1   Platelets 140 - 400 Thousand/uL 247  221  236     Lab Results  Component Value Date/Time   VD25OH 29 (L) 11/02/2014 10:42 AM   VD25OH 33 04/23/2013 02:19 PM    Clinical ASCVD:  The 10-year ASCVD risk score (Arnett DK, et al., 2019) is: 33.6%   Values used to calculate the score:     Age: 71 years     Sex: Female     Is Non-Hispanic African American: No     Diabetic: Yes     Tobacco smoker: No     Systolic Blood Pressure: 818 mmHg     Is BP treated: Yes     HDL Cholesterol: 76 mg/dL     Total Cholesterol: 174 mg/dL    Social History   Tobacco Use  Smoking Status Never  Smokeless Tobacco Never   BP Readings from Last 3  Encounters:  03/26/22 (!) 148/76  02/13/22 138/62  01/14/22 112/65   Pulse Readings from Last 3 Encounters:  03/26/22 68  02/13/22 86  01/14/22 98   Wt Readings from Last 3 Encounters:  03/26/22 161 lb (73 kg)  02/13/22 162 lb (73.5 kg)  01/14/22 161 lb (73 kg)    Assessment: Review of patient past medical history, allergies, medications, health status, including review of consultants reports, laboratory and other test data, was performed as part of comprehensive evaluation and provision of chronic care management services.   SDOH:  (Social Determinants of Health) assessments and interventions performed:  SDOH Interventions    Flowsheet Row Office Visit from 10/06/2020 in [place] Interventions Intervention Not Indicated  Housing Interventions Intervention Not Indicated  Transportation Interventions Intervention Not Indicated  Financial Strain Interventions Intervention Not Indicated  Physical Activity Interventions Intervention Not Indicated  Stress Interventions Intervention Not Indicated  Social Connections Interventions Intervention Not Indicated       CCM Care Plan  Allergies  Allergen Reactions   Amoxicillin Swelling   Gabapentin     dizzy   Atorvastatin Other (See Comments)    Memory loss    Medications Reviewed Today     Reviewed by Ann Griffith (Physician Assistant) on 03/29/22 at [place] List Status: <None>   Medication Order Taking? Sig Documenting Provider Last Dose Status Informant  AMBULATORY NON FORMULARY MEDICATION [id number] Yes Single glucometer with lancets, test strips Breeback, Jade L, PA-C  Active   blood glucose meter kit and supplies [id number] Yes Dispense based on patient and insurance preference. Use up to four times daily as directed. (FOR ICD-10 E10.9, E11.9). Breeback, Jade L, PA-C  Active   Cyanocobalamin (VITAMIN B-12 PO) [id number] Yes Take 5,000  mcg by mouth once a week. [provider] Taking Active   cyclobenzaprine (FLEXERIL) 5 MG tablet [id number] Yes Take 1 tablet (5 mg total) by mouth 3 (three) times daily as needed for muscle spasms. Hali Marry, MD Taking Active   diclofenac (VOLTAREN) 75 MG EC tablet [id number] Yes TAKE 1 TABLET BY MOUTH TWICE A DAY Breeback, Jade L, PA-C Taking Active   diclofenac Sodium (VOLTAREN) 1 % GEL [id number] Yes APPLY 4 GRAMS 4 TIMES DAILY TOPICALLY. TO AFFECTED JOINT. Breeback, Jade L, PA-C Taking Active   ezetimibe (ZETIA) 10  MG tablet [id number] Yes Take 1 tablet (10 mg total) by mouth daily. For cholesterol. Donella Stade, PA-C Taking Active   ferrous sulfate 325 (65 FE) MG EC tablet [id number] Yes TAKE 1 TABLET BY MOUTH EVERY DAY WITH BREAKFAST Breeback, Jade L, PA-C Taking Active   fish oil-omega-3 fatty acids 1000 MG capsule [id number] Yes Take 1 g by mouth daily. [provider] Taking Active   lisinopril (ZESTRIL) 2.5 MG tablet [id number] Yes TAKE 1 TABLET BY MOUTH EVERY DAY Donella Stade, PA-C Taking Active   MAGNESIUM PO [id number] Yes Take 250 mg by mouth daily. [provider] Taking Active   metFORMIN (GLUCOPHAGE) 1000 MG tablet [id number] Yes TAKE 1 TABLET (1,000 MG TOTAL) BY MOUTH TWICE A DAY WITH FOOD Breeback, Jade L, PA-C Taking Active   pantoprazole (PROTONIX) 40 MG tablet [id number] Yes Take 1 tablet (40 mg total) by mouth daily. Donella Stade, PA-C Taking Active            Med Note Tonny Bollman Jan 17, 2022 11:20 AM) Dewaine Conger every other day  rOPINIRole (REQUIP) 0.5 MG tablet [id number] Yes TAKE 1 TABLET BY MOUTH 3 TIMES DAILY. Donella Stade, PA-C Taking Active   triamcinolone cream (KENALOG) 0.1 % [id number] Yes APPLY TO AFFECTED AREA TWICE A DAY Donella Stade, PA-C Taking Active   TURMERIC PO [id number] Yes Take 500 mg by mouth daily. [provider] Taking Active   VITAMIN D PO [id number] Yes Take 5,000 Int'l Units by mouth daily.  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 03/13/2021   DDD (degenerative disc disease), lumbar 12/12/2020   Facet arthritis, degenerative, lumbar spine 06/28/2020   Acute left-sided low back pain with left-sided sciatica 06/28/2020   Iron deficiency anemia 06/13/2020   Myalgia 03/14/2020   Degenerative disc disease, cervical 12/07/2019   Toe pain, right 12/01/2019   Leg cramping 09/03/2019   Statin intolerance 07/03/2018   Itching 04/27/2018   Chronic pain of both ankles 04/27/2018   Bilateral carpal tunnel syndrome 04/27/2018   Raynaud's phenomenon without gangrene 02/03/2018   Mitral regurgitation 12/19/2017   Tricuspid regurgitation 12/19/2017   Orthostatic hypotension 12/16/2017   Syncope and collapse 12/16/2017   Essential hypertension 12/10/2017   Concussion 12/08/2017   Fear of flying 09/28/2017   Primary osteoarthritis of both knees 06/26/2017   Carpal tunnel syndrome, right 06/26/2017   Dry skin dermatitis 06/26/2017   Elevated serum creatinine 12/10/2016   Cataract cortical, senile, left 06/04/2016   Chronic right shoulder pain 04/01/2016   RLS (restless legs syndrome) 03/29/2016   Insomnia 08/08/2015   Bilateral leg pain 08/08/2015   Shoulder bursitis 08/08/2015   Memory changes 05/05/2015   Seborrheic dermatitis 03/14/2015   Hyperlipidemia LDL goal <70 11/03/2014   BPPV (benign paroxysmal positional  vertigo), right 11/02/2014   Microalbuminuria 11/02/2014   Raynaud's syndrome 08/09/2013  Diverticulosis 06/07/2013   Avitaminosis D 10/09/2012   Colon polyp 04/24/2011   Combined hyperlipidemia associated with type 2 diabetes mellitus ([place]) 04/24/2011   Type 2 diabetes mellitus with complication, without long-term current use of insulin ([place]) 11/09/2007   SCHATZKI'S RING 11/09/2007   CHOLELITHIASIS, HX OF 11/09/2007    Immunization History  Administered Date(s) Administered   Fluad Quad(high Dose 65+) 05/31/2019, 06/13/2020   Influenza,inj,Quad PF,6+ Mos 05/02/2014, 05/05/2015, 06/04/2016, 06/24/2017, 04/27/2018   Influenza-Unspecified 06/07/2021   PFIZER(Purple Top)SARS-COV-2 Vaccination 10/05/2019, 10/26/2019, 04/28/2020   Pneumococcal Conjugate-13 05/05/2015   Pneumococcal Polysaccharide-23 06/04/2016   Tdap 12/21/2009, 10/14/2012   Zoster Recombinat (Shingrix) 11/17/2019, 03/28/2020    Conditions to be addressed/monitored: HTN, HLD, and DMII  There are no care plans that you recently modified to display for this patient.   Medication Assistance: None required.  Patient affirms current coverage meets needs.  Patient's preferred pharmacy is:  [id number] [place] [place] [address]Phone: [phone]: [phone] [id number] [place] [id number] [address] [place] [id number] [facility] Professional [phone]: [phone]: [phone] pill box? Yes Pt endorses 100% compliance  Follow Up:  Patient agrees to Care Plan and Follow-up.  Plan: Telephone follow up appointment with care management team member scheduled for:  6 months  KeLarinda ButteryPharmD Clinical Pharmacist [phone]

## 2022-06-26 ENCOUNTER — Ambulatory Visit (INDEPENDENT_AMBULATORY_CARE_PROVIDER_SITE_OTHER): Payer: Medicare HMO | Admitting: Physician Assistant

## 2022-06-26 ENCOUNTER — Encounter: Payer: Self-pay | Admitting: Physician Assistant

## 2022-06-26 VITALS — BP 162/76 | HR 70 | Ht 66.0 in | Wt 158.0 lb

## 2022-06-26 DIAGNOSIS — G720 Drug-induced myopathy: Secondary | ICD-10-CM | POA: Diagnosis not present

## 2022-06-26 DIAGNOSIS — E118 Type 2 diabetes mellitus with unspecified complications: Secondary | ICD-10-CM

## 2022-06-26 DIAGNOSIS — E785 Hyperlipidemia, unspecified: Secondary | ICD-10-CM | POA: Diagnosis not present

## 2022-06-26 DIAGNOSIS — G479 Sleep disorder, unspecified: Secondary | ICD-10-CM | POA: Insufficient documentation

## 2022-06-26 DIAGNOSIS — N1831 Chronic kidney disease, stage 3a: Secondary | ICD-10-CM

## 2022-06-26 DIAGNOSIS — I10 Essential (primary) hypertension: Secondary | ICD-10-CM

## 2022-06-26 DIAGNOSIS — R944 Abnormal results of kidney function studies: Secondary | ICD-10-CM | POA: Diagnosis not present

## 2022-06-26 DIAGNOSIS — E1122 Type 2 diabetes mellitus with diabetic chronic kidney disease: Secondary | ICD-10-CM

## 2022-06-26 LAB — POCT GLYCOSYLATED HEMOGLOBIN (HGB A1C): Hemoglobin A1C: 6.3 % — AB (ref 4.0–5.6)

## 2022-06-26 MED ORDER — HYDROXYZINE HCL 10 MG PO TABS: ORAL_TABLET | ORAL | 2 refills | Status: DC

## 2022-06-26 MED ORDER — METFORMIN HCL 1000 MG PO TABS: ORAL_TABLET | ORAL | 1 refills | Status: DC

## 2022-06-26 MED ORDER — LISINOPRIL 5 MG PO TABS: 5.0000 mg | ORAL_TABLET | Freq: Every day | ORAL | 3 refills | Status: DC

## 2022-06-26 NOTE — Patient Instructions (Addendum)
Alpha lipoic acid

## 2022-06-26 NOTE — Progress Notes (Signed)
Established Patient Office Visit  Subjective   Patient ID: Ann Griffith, female    DOB: [birth year]  Age: 71 y.o. MRN: [id number]  Chief Complaint  Patient presents with   Follow-up   Diabetes    HPI Pt is a 71 yo female with T2DM, HTN, HLD, RLS who presents to the clinic for 3 month follow up.   Overall pt is doing good. She does not have test strips to test sugars. No open sores or wounds. No CP, palpitatons, headaches or vision changes. She is taking metformin bid most days.   Trouble sleeping. She is taking tylenol PM and wants to take something healthier.   .. Active Ambulatory Problems    Diagnosis Date Noted   Type 2 diabetes mellitus with complication, without long-term current use of insulin ([place]) 11/09/2007   SCHATZKI'S RING 11/09/2007   CHOLELITHIASIS, HX OF 11/09/2007   Diverticulosis 06/07/2013   Raynaud's syndrome 08/09/2013   BPPV (benign paroxysmal positional vertigo), right 11/02/2014   Microalbuminuria 11/02/2014   Hyperlipidemia LDL goal <70 11/03/2014   Seborrheic dermatitis 03/14/2015   Memory changes 05/05/2015   Insomnia 08/08/2015   Bilateral leg pain 08/08/2015   Shoulder bursitis 08/08/2015   Avitaminosis D 10/09/2012   Colon polyp 04/24/2011   RLS (restless legs syndrome) 03/29/2016   Chronic right shoulder pain 04/01/2016   Cataract cortical, senile, left 06/04/2016   Elevated serum creatinine 12/10/2016   Primary osteoarthritis of both knees 06/26/2017   Carpal tunnel syndrome, right 06/26/2017   Dry skin dermatitis 06/26/2017   Fear of flying 09/28/2017   Orthostatic hypotension 12/16/2017   Syncope and collapse 12/16/2017   Mitral regurgitation 12/19/2017   Tricuspid regurgitation 12/19/2017   Raynaud's phenomenon without gangrene 02/03/2018   Concussion 12/08/2017   Essential hypertension 12/10/2017   Combined hyperlipidemia associated with type 2 diabetes mellitus ([place]) 04/24/2011   Itching 04/27/2018   Chronic pain of both  ankles 04/27/2018   Bilateral carpal tunnel syndrome 04/27/2018   Statin intolerance 07/03/2018   Leg cramping 09/03/2019   Toe pain, right 12/01/2019   Degenerative disc disease, cervical 12/07/2019   Myalgia 03/14/2020   Iron deficiency anemia 06/13/2020   Facet arthritis, degenerative, lumbar spine 06/28/2020   Acute left-sided low back pain with left-sided sciatica 06/28/2020   DDD (degenerative disc disease), lumbar 12/12/2020   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 03/13/2021   Stage 3a chronic kidney disease ([place]) 03/14/2021   Squamous cell carcinoma in situ 03/19/2021   Gastroesophageal reflux disease without esophagitis 06/13/2021   Change in consistency of stool 06/13/2021   Left lumbar radiculitis 07/31/2021   Irritable bowel syndrome with diarrhea 12/24/2021   Mild cognitive impairment 01/14/2022   Elevated vitamin B12 level 02/13/2022   Decreased GFR 02/13/2022   Trouble in sleeping 06/26/2022   Type 2 diabetes mellitus with stage 3a chronic kidney disease, without long-term current use of insulin ([place]) 06/26/2022   Resolved Ambulatory Problems    Diagnosis Date Noted   Eustachian tube dysfunction 05/02/2014   Tennis elbow syndrome 03/29/2016   Right shoulder pain 03/29/2016   Tricompartment osteoarthritis of knees, bilateral 06/25/2017   Trigger finger, left ring finger 06/26/2017   Controlled type 2 diabetes mellitus without complication, without long-term current use of insulin ([place]) 06/26/2017   Trochanteric bursitis 06/27/2017   Flatulence 09/28/2017   Nausea 12/16/2017   Hematoma and contusion 12/16/2017   Paronychia of great toe, left 02/07/2020   Ingrown toenail of right foot 03/14/2020   Osteoarthritis  of patellofemoral joints, bilateral 03/14/2021   Past Medical History:  Diagnosis Date   DIABETES MELLITUS, TYPE II 11/09/2007   Hyperlipidemia      ROS See HPI.    Objective:     BP (!) 162/76   Pulse 70   Ht '5\' 6"'$  (1.676 m)   Wt 158 lb (71.7  kg)   LMP 08/12/2000   SpO2 100%   BMI 25.50 kg/m  BP Readings from Last 3 Encounters:  06/26/22 (!) 162/76  03/26/22 (!) 148/76  02/13/22 138/62   Wt Readings from Last 3 Encounters:  06/26/22 158 lb (71.7 kg)  03/26/22 161 lb (73 kg)  02/13/22 162 lb (73.5 kg)    .[name] Results for orders placed or performed in visit on 06/26/22  POCT glycosylated hemoglobin (Hb A1C)  Result Value Ref Range   Hemoglobin A1C 6.3 (A) 4.0 - 5.6 %   HbA1c POC (<> result, manual entry)     HbA1c, POC (prediabetic range)     HbA1c, POC (controlled diabetic range)       Physical Exam Constitutional:      Appearance: Normal appearance.  HENT:     Head: Normocephalic.  Neck:     Vascular: No carotid bruit.  Cardiovascular:     Rate and Rhythm: Normal rate.     Heart sounds: Murmur heard.  Pulmonary:     Effort: Pulmonary effort is normal.     Breath sounds: Normal breath sounds.  Musculoskeletal:     Cervical back: Normal range of motion and neck supple. No rigidity or tenderness.  Lymphadenopathy:     Cervical: No cervical adenopathy.  Neurological:     General: No focal deficit present.     Mental Status: She is alert and oriented to person, place, and time.  Psychiatric:        Mood and Affect: Mood normal.       The 10-year ASCVD risk score (Arnett DK, et al., 2019) is: 38.8%    Assessment & Plan:  [name] was seen today for follow-up and diabetes.  Diagnoses and all orders for this visit:  Type 2 diabetes mellitus with stage 3a chronic kidney disease, without long-term current use of insulin (HCC) -     POCT glycosylated hemoglobin (Hb A1C)  Trouble in sleeping -     hydrOXYzine (ATARAX) 10 MG tablet; Take 1-2 tablets at bedtime for sleep.  Decreased GFR  Hyperlipidemia LDL goal <70  Essential hypertension -     lisinopril (ZESTRIL) 5 MG tablet; Take 1 tablet (5 mg total) by mouth daily.  Statin myopathy   A1C to goal Continue same medications.  BP not to  goal increase lisinopril to '5mg'$  daily Watch salt in diet Record BP to get under 140/90 Statin intolerant on praulent CMP UTD Hydroxyzine given for sleep to avoid using tylenol PM Follow up in 3 months    Return in about 3 months (around 09/26/2022).    Iran Planas, PA-C

## 2022-06-28 ENCOUNTER — Other Ambulatory Visit: Payer: Self-pay

## 2022-06-28 DIAGNOSIS — E118 Type 2 diabetes mellitus with unspecified complications: Secondary | ICD-10-CM

## 2022-06-28 MED ORDER — ONETOUCH VERIO VI STRP: ORAL_STRIP | 12 refills | Status: DC

## 2022-06-28 MED ORDER — LANCETS 30G MISC: 99 refills | Status: DC

## 2022-07-01 ENCOUNTER — Other Ambulatory Visit: Payer: Self-pay | Admitting: Neurology

## 2022-07-01 DIAGNOSIS — E118 Type 2 diabetes mellitus with unspecified complications: Secondary | ICD-10-CM

## 2022-07-01 MED ORDER — ONETOUCH ULTRASOFT LANCETS MISC: 12 refills | Status: DC

## 2022-07-01 MED ORDER — ONETOUCH VERIO VI STRP: ORAL_STRIP | 12 refills | Status: AC

## 2022-07-02 ENCOUNTER — Telehealth: Payer: Self-pay

## 2022-07-02 NOTE — Progress Notes (Signed)
  Care Coordination Note  07/02/2022 Name: Jenae Tomasello MRN: [id number] DOB: 07-01-1951  Quincie Haroon is a 71 y.o. year old female who is a primary care patient of Lavada Mesi and is actively engaged with the care management team. I reached out to [employer] by phone today to assist with re-scheduling a follow up visit with the Pharmacist  Follow up plan: Unsuccessful telephone outreach attempt made. A HIPAA compliant phone message was left for the patient providing contact information and requesting a return call.  The care management team will reach out to the patient again over the next 7 days.  If patient returns call to provider office, please advise to call [facility]  at [place], [place], [place] [address] Direct Dial: [phone] [email or web address]

## 2022-07-15 ENCOUNTER — Other Ambulatory Visit: Payer: Self-pay | Admitting: Physician Assistant

## 2022-07-15 DIAGNOSIS — G8929 Other chronic pain: Secondary | ICD-10-CM

## 2022-07-20 ENCOUNTER — Other Ambulatory Visit: Payer: Self-pay | Admitting: Physician Assistant

## 2022-07-20 DIAGNOSIS — G479 Sleep disorder, unspecified: Secondary | ICD-10-CM

## 2022-07-22 NOTE — Progress Notes (Signed)
  Care Coordination Note  07/22/2022 Name: Ann Griffith MRN: [id number] DOB: [birth year]  Ann Griffith is a 71 y.o. year old female who is a primary care patient of Lavada Mesi and is actively engaged with the care management team. I reached out to [employer] by phone today to assist with re-scheduling a follow up visit with the Pharmacist  Follow up plan: Unsuccessful telephone outreach attempt made. A HIPAA compliant phone message was left for the patient providing contact information and requesting a return call.  The care management team will reach out to the patient again over the next 7 days.  If patient returns call to provider office, please advise to call [place]  at [place], [place], [place] [address] Direct Dial: [phone] [email or web address]

## 2022-07-31 NOTE — Progress Notes (Signed)
  Care Coordination Note  07/31/2022 Name: Ann Griffith MRN: [id number] DOB: July 02, 1951  Ann Griffith is a 71 y.o. year old female who is a primary care patient of Lavada Mesi and is actively engaged with the care management team. I reached out to [employer] by phone today to assist with re-scheduling a follow up visit with the Pharmacist  Follow up plan: Telephone appointment with care management team member scheduled for:08/15/2022   Ann Griffith, [place], [place] [address] Direct Dial: [phone] [email or web address]

## 2022-08-15 ENCOUNTER — Ambulatory Visit (INDEPENDENT_AMBULATORY_CARE_PROVIDER_SITE_OTHER): Payer: Medicare HMO | Admitting: Pharmacist

## 2022-08-15 DIAGNOSIS — E785 Hyperlipidemia, unspecified: Secondary | ICD-10-CM

## 2022-08-15 DIAGNOSIS — E1122 Type 2 diabetes mellitus with diabetic chronic kidney disease: Secondary | ICD-10-CM

## 2022-08-15 DIAGNOSIS — I10 Essential (primary) hypertension: Secondary | ICD-10-CM

## 2022-08-15 DIAGNOSIS — N1831 Chronic kidney disease, stage 3a: Secondary | ICD-10-CM

## 2022-08-15 NOTE — Progress Notes (Signed)
Chronic Care Management Pharmacy Note  08/15/2022 Name:  Ann Griffith MRN:  [id number] DOB:  03/31/1951  Summary: addressed DM, HTN, HLD. Since our last discussion, she began hydroxyzine for sleep aid per PCP, going well.  BP readings at home: 171/87, 134/69. 163/77 AM, 147/73, in mornings mostly 956-387F systolic.  BG readings at home: 104, 99, 206 (after dinner), 95, 100, 113, 104, 114  Recommendations/Changes made from today's visit:  - Insurance now covers repatha instead, plans to change from praluent to repatha, she will discuss at next PCP visit - Recommend increase lisinopril 58m to 166mdaily  Plan: f/u with pharmacist in 3-6 months  Subjective: Ann Griffith an [age].o. year old female who is a primary patient of Ann StadePA-C.  The CCM team was consulted for assistance with disease management and care coordination needs.    Engaged with patient by telephone for follow up visit in response to provider referral for pharmacy case management and/or care coordination services.   Consent to Services:  The patient was given information about Chronic Care Management services, agreed to services, and gave verbal consent prior to initiation of services.  Please see initial visit note for detailed documentation.   Patient Care Team: Ann Griffith PCP - General (Family Medicine) MiMegan SalonMD as Consulting Physician (Gynecology) [facility]arius [facility] Pharmacist (Pharmacist)  Recent office visits:  03/30/21 ThAundria MemsD - Seen for osteoarthritis of both knees - Triamcinolone acetonide injection given in office  - Follow up in 4 weeks  03/13/21 JaIran PlanasA - Seen for diabetes - Labs ordered - Stop crestor for 2 weeks - Increase metformin to 1000 mg twice daily - lliotibial band syndrome rehab - Follow up in 3 months      Recent consult visits:  02/01/21 [facility] - Seen for diabetes - No notes available   01/04/21 DaGildardo [place] - Dermatology - Seen for Carcinoma in situ of skin of other parts of face - No notes available    Hospital visits:  None in previous 6 months  Objective:  Lab Results  Component Value Date   CREATININE 1.01 (H) 02/13/2022   CREATININE 1.44 (H) 01/14/2022   CREATININE 0.94 09/17/2021    Lab Results  Component Value Date   HGBA1C 6.3 (A) 06/26/2022   Last diabetic Eye exam:  Lab Results  Component Value Date/Time   HMDIABEYEEXA No Retinopathy 09/14/2020 12:00 AM    Last diabetic Foot exam: No results found for: "HMDIABFOOTEX"      Component Value Date/Time   CHOL 174 09/17/2021 0957   TRIG 75 09/17/2021 0957   HDL 76 09/17/2021 0957   CHOLHDL 2.3 09/17/2021 0957   VLDL 12 10/30/2015 0805   LDLCALC 82 09/17/2021 0957       Latest Ref Rng & Units 03/13/2021   12:00 AM 09/14/2020    8:07 AM 09/01/2019    9:24 AM  Hepatic Function  Total Protein 6.1 - 8.1 g/dL 7.2  6.8  6.9   AST 10 - 35 U/L _0 ALT 6 - 29 U/L _1 Total Bilirubin 0.2 - 1.2 mg/dL 0.5  0.3  0.4     Lab Results  Component Value Date/Time   TSH 2.62 01/14/2022 12:00 AM   TSH 2.98 09/14/2020 08:07 AM       Latest Ref Rng & Units 01/14/2022  12:00 AM 03/13/2021   12:00 AM 09/14/2020    8:07 AM  CBC  WBC 3.8 - 10.8 Thousand/uL 6.6  5.5  5.0   Hemoglobin 11.7 - 15.5 g/dL 11.5  11.9  11.4   Hematocrit 35.0 - 45.0 % 33.5  36.7  33.1   Platelets 140 - 400 Thousand/uL 247  221  236     Lab Results  Component Value Date/Time   VD25OH 29 (L) 11/02/2014 10:42 AM   VD25OH 33 04/23/2013 02:19 PM    Clinical ASCVD:  The 10-year ASCVD risk score (Arnett DK, et al., 2019) is: 38.8%   Values used to calculate the score:     Age: 71 years     Sex: Female     Is Non-Hispanic African American: No     Diabetic: Yes     Tobacco smoker: No     Systolic Blood Pressure: 161 mmHg     Is BP treated: Yes     HDL Cholesterol: 76 mg/dL     Total Cholesterol: 174 mg/dL     Social History   Tobacco Use  Smoking Status Never  Smokeless Tobacco Never   BP Readings from Last 3 Encounters:  06/26/22 (!) 162/76  03/26/22 (!) 148/76  02/13/22 138/62   Pulse Readings from Last 3 Encounters:  06/26/22 70  03/26/22 68  02/13/22 86   Wt Readings from Last 3 Encounters:  06/26/22 158 lb (71.7 kg)  03/26/22 161 lb (73 kg)  02/13/22 162 lb (73.5 kg)    Assessment: Review of patient past medical history, allergies, medications, health status, including review of consultants reports, laboratory and other test data, was performed as part of comprehensive evaluation and provision of chronic care management services.   SDOH:  (Social Determinants of Health) assessments and interventions performed:  SDOH Interventions    Flowsheet Row Office Visit from 10/06/2020 in Ann Griffith Interventions Intervention Not Indicated  Housing Interventions Intervention Not Indicated  Transportation Interventions Intervention Not Indicated  Financial Strain Interventions Intervention Not Indicated  Physical Activity Interventions Intervention Not Indicated  Stress Interventions Intervention Not Indicated  Social Connections Interventions Intervention Not Indicated       CCM Care Plan  Allergies  Allergen Reactions   Amoxicillin Swelling   Gabapentin     dizzy   Atorvastatin Other (See Comments)    Memory loss    Medications Reviewed Today     Reviewed by Donella Stade, PA-C (Physician Assistant) on 06/26/22 at 1330  Med List Status: <None>   Medication Order Taking? Sig Documenting Provider Last Dose Status Informant  Alirocumab (PRALUENT) 150 MG/ML SOAJ [id number] Yes Inject 147m into skin every 14 days. BDonella Stade PA-C Taking Active   AMBULATORY NON FORMULARY MEDICATION [id number] Single glucometer with lancets, test strips BDonella Stade PA-C Taking Active   blood  glucose meter kit and supplies [id number] Dispense based on patient and insurance preference. Use up to four times daily as directed. (FOR ICD-10 E10.9, E11.9). BDonella Stade PA-C Taking Active   Cyanocobalamin (VITAMIN B-12 PO) [id number] Take 5,000 mcg by mouth once a week. [provider] Taking Active   diclofenac (VOLTAREN) 75 MG EC tablet [id number] TAKE 1 TABLET BY MOUTH TWICE A DAY Breeback, Jade L, PA-C Taking Active   diclofenac Sodium (VOLTAREN) 1 % GEL [id number] APPLY 4 GRAMS 4 TIMES DAILY TOPICALLY. TO AFFECTED JOINT. BAlden Hipp  Royetta Car, PA-C Taking Active   ferrous sulfate 325 (65 FE) MG EC tablet [id number] Yes TAKE 1 TABLET BY MOUTH EVERY DAY WITH BREAKFAST Breeback, Jade L, PA-C Taking Active   fish oil-omega-3 fatty acids 1000 MG capsule [id number] Yes Take 1 g by mouth daily. [provider] Taking Active   hydrOXYzine (ATARAX) 10 MG tablet [id number] Yes Take 1-2 tablets at bedtime for sleep. Breeback, Jade L, PA-C  Active   lisinopril (ZESTRIL) 5 MG tablet [id number] Yes Take 1 tablet (5 mg total) by mouth daily. Lavada Mesi  Active   MAGNESIUM PO [id number] Yes Take 250 mg by mouth daily. [provider] Taking Active   metFORMIN (GLUCOPHAGE) 1000 MG tablet [id number]  TAKE 1 TABLET (1,000 MG TOTAL) BY MOUTH TWICE A DAY WITH FOOD Donella Stade, PA-C  Active   pantoprazole (PROTONIX) 40 MG tablet [id number] Yes TAKE 1 TABLET BY MOUTH EVERY DAY Breeback, Jade L, PA-C Taking Active   rOPINIRole (REQUIP) 0.5 MG tablet [id number] Yes TAKE 1 TABLET BY MOUTH 3 TIMES DAILY. Donella Stade, PA-C Taking Active   triamcinolone cream (KENALOG) 0.1 % [id number] Yes APPLY TO AFFECTED AREA TWICE A DAY Donella Stade, PA-C Taking Active   TURMERIC PO [id number] Yes Take 500 mg by mouth daily. [provider] Taking Active   VITAMIN D PO [id number] Yes Take 5,000 Int'l Units by mouth daily. [provider] Taking Active   Med List  Note Lavada Mesi 07/31/21 [id number]):              Patient Active Problem List   Diagnosis Date Noted   Trouble in sleeping 06/26/2022   Type 2 diabetes mellitus with stage 3a chronic kidney disease, without long-term current use of insulin ([place]) 06/26/2022   Elevated vitamin B12 level 02/13/2022   Decreased GFR 02/13/2022   Mild cognitive impairment 01/14/2022   Irritable bowel syndrome with diarrhea 12/24/2021   Left lumbar radiculitis 07/31/2021   Gastroesophageal reflux disease without esophagitis 06/13/2021   Change in consistency of stool 06/13/2021   Squamous cell carcinoma in situ 03/19/2021   Stage 3a chronic kidney disease ([place]) 03/14/2021   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 03/13/2021   DDD (degenerative disc disease), lumbar 12/12/2020   Facet arthritis, degenerative, lumbar spine 06/28/2020   Acute left-sided low back pain with left-sided sciatica 06/28/2020   Iron deficiency anemia 06/13/2020   Myalgia 03/14/2020   Degenerative disc disease, cervical 12/07/2019   Toe pain, right 12/01/2019   Leg cramping 09/03/2019   Statin intolerance 07/03/2018   Itching 04/27/2018   Chronic pain of both ankles 04/27/2018   Bilateral carpal tunnel syndrome 04/27/2018   Raynaud's phenomenon without gangrene 02/03/2018   Mitral regurgitation 12/19/2017   Tricuspid regurgitation 12/19/2017   Orthostatic hypotension 12/16/2017   Syncope and collapse 12/16/2017   Essential hypertension 12/10/2017   Concussion 12/08/2017   Fear of flying 09/28/2017   Primary osteoarthritis of both knees 06/26/2017   Carpal tunnel syndrome, right 06/26/2017   Dry skin dermatitis 06/26/2017   Elevated serum creatinine 12/10/2016   Cataract cortical, senile, left 06/04/2016   Chronic right shoulder pain 04/01/2016   RLS (restless legs syndrome) 03/29/2016   Insomnia 08/08/2015   Bilateral leg pain 08/08/2015   Shoulder bursitis 08/08/2015   Memory changes 05/05/2015   Seborrheic  dermatitis 03/14/2015   Hyperlipidemia LDL goal <70 11/03/2014   BPPV (benign paroxysmal positional vertigo), right 11/02/2014   Microalbuminuria 11/02/2014  Raynaud's syndrome 08/09/2013   Diverticulosis 06/07/2013   Avitaminosis D 10/09/2012   Colon polyp 04/24/2011   Combined hyperlipidemia associated with type 2 diabetes mellitus ([place]) 04/24/2011   Type 2 diabetes mellitus with complication, without long-term current use of insulin ([place]) 11/09/2007   SCHATZKI'S RING 11/09/2007   CHOLELITHIASIS, HX OF 11/09/2007    Immunization History  Administered Date(s) Administered   Fluad Quad(high Dose 65+) 05/31/2019, 06/13/2020   Influenza,inj,Quad PF,6+ Mos 05/02/2014, 05/05/2015, 06/04/2016, 06/24/2017, 04/27/2018   Influenza-Unspecified 06/07/2021, 05/06/2022   PFIZER(Purple Top)SARS-COV-2 Vaccination 10/05/2019, 10/26/2019, 04/28/2020   Pneumococcal Conjugate-13 05/05/2015   Pneumococcal Polysaccharide-23 06/04/2016   Tdap 12/21/2009, 10/14/2012   Zoster Recombinat (Shingrix) 11/17/2019, 03/28/2020    Conditions to be addressed/monitored: HTN, HLD, and DMII  There are no care plans that you recently modified to display for this patient.   Medication Assistance: None required.  Patient affirms current coverage meets needs.  Patient's preferred pharmacy is:  [id number] [place], [place] [address]Phone: [phone]: [phone] [id number] [place] [id number] [id number] [place] [facility] [id number]: [phone]: [phone] pill box? Yes Pt endorses 100% compliance  Follow Up:  Patient agrees to Care Plan and Follow-up.  Plan: Telephone follow up appointment with care management team member scheduled for:  3-6 months  KeLarinda ButteryPharmD Clinical Pharmacist [phone]

## 2022-08-16 MED ORDER — LISINOPRIL 10 MG PO TABS: 10.0000 mg | ORAL_TABLET | Freq: Every day | ORAL | 1 refills | Status: DC

## 2022-08-16 NOTE — Addendum Note (Signed)
Addended by: Donella Stade on: 08/16/2022 06:07 AM   Modules accepted: Orders

## 2022-08-21 ENCOUNTER — Other Ambulatory Visit: Payer: Self-pay | Admitting: Physician Assistant

## 2022-08-21 DIAGNOSIS — G2581 Restless legs syndrome: Secondary | ICD-10-CM

## 2022-09-01 ENCOUNTER — Other Ambulatory Visit: Payer: Self-pay | Admitting: Physician Assistant

## 2022-09-01 DIAGNOSIS — E118 Type 2 diabetes mellitus with unspecified complications: Secondary | ICD-10-CM

## 2022-09-01 DIAGNOSIS — I10 Essential (primary) hypertension: Secondary | ICD-10-CM

## 2022-09-18 DIAGNOSIS — H43813 Vitreous degeneration, bilateral: Secondary | ICD-10-CM | POA: Diagnosis not present

## 2022-09-18 DIAGNOSIS — H35031 Hypertensive retinopathy, right eye: Secondary | ICD-10-CM | POA: Diagnosis not present

## 2022-09-18 DIAGNOSIS — E119 Type 2 diabetes mellitus without complications: Secondary | ICD-10-CM | POA: Diagnosis not present

## 2022-09-18 DIAGNOSIS — Z961 Presence of intraocular lens: Secondary | ICD-10-CM | POA: Diagnosis not present

## 2022-09-18 DIAGNOSIS — H04123 Dry eye syndrome of bilateral lacrimal glands: Secondary | ICD-10-CM | POA: Diagnosis not present

## 2022-09-18 DIAGNOSIS — Z135 Encounter for screening for eye and ear disorders: Secondary | ICD-10-CM | POA: Diagnosis not present

## 2022-09-18 DIAGNOSIS — H2511 Age-related nuclear cataract, right eye: Secondary | ICD-10-CM | POA: Diagnosis not present

## 2022-09-18 DIAGNOSIS — H5203 Hypermetropia, bilateral: Secondary | ICD-10-CM | POA: Diagnosis not present

## 2022-09-18 DIAGNOSIS — H524 Presbyopia: Secondary | ICD-10-CM | POA: Diagnosis not present

## 2022-09-18 LAB — HM DIABETES EYE EXAM

## 2022-09-23 ENCOUNTER — Encounter: Payer: Self-pay | Admitting: Physician Assistant

## 2022-09-27 ENCOUNTER — Encounter: Payer: Self-pay | Admitting: Physician Assistant

## 2022-09-27 ENCOUNTER — Ambulatory Visit (INDEPENDENT_AMBULATORY_CARE_PROVIDER_SITE_OTHER): Payer: Medicare HMO | Admitting: Physician Assistant

## 2022-09-27 VITALS — BP 175/86 | HR 74 | Ht 66.0 in | Wt 158.0 lb

## 2022-09-27 DIAGNOSIS — E785 Hyperlipidemia, unspecified: Secondary | ICD-10-CM | POA: Diagnosis not present

## 2022-09-27 DIAGNOSIS — E118 Type 2 diabetes mellitus with unspecified complications: Secondary | ICD-10-CM

## 2022-09-27 DIAGNOSIS — Z789 Other specified health status: Secondary | ICD-10-CM

## 2022-09-27 DIAGNOSIS — I10 Essential (primary) hypertension: Secondary | ICD-10-CM

## 2022-09-27 DIAGNOSIS — G2581 Restless legs syndrome: Secondary | ICD-10-CM

## 2022-09-27 DIAGNOSIS — Z1329 Encounter for screening for other suspected endocrine disorder: Secondary | ICD-10-CM | POA: Diagnosis not present

## 2022-09-27 DIAGNOSIS — N1831 Chronic kidney disease, stage 3a: Secondary | ICD-10-CM | POA: Diagnosis not present

## 2022-09-27 DIAGNOSIS — E1122 Type 2 diabetes mellitus with diabetic chronic kidney disease: Secondary | ICD-10-CM | POA: Diagnosis not present

## 2022-09-27 DIAGNOSIS — E079 Disorder of thyroid, unspecified: Secondary | ICD-10-CM | POA: Diagnosis not present

## 2022-09-27 LAB — POCT GLYCOSYLATED HEMOGLOBIN (HGB A1C): Hemoglobin A1C: 5.8 % — AB (ref 4.0–5.6)

## 2022-09-27 MED ORDER — LISINOPRIL 20 MG PO TABS: 20.0000 mg | ORAL_TABLET | Freq: Every day | ORAL | 0 refills | Status: DC

## 2022-09-27 NOTE — Progress Notes (Signed)
Established Patient Office Visit  Subjective   Patient ID: Ann Griffith, female    DOB: Jun 17, 1951  Age: 72 y.o. MRN: [id number]  Chief Complaint  Patient presents with   Follow-up    HPI Pt is a 72 yo female with T2DM, HTN, HLD, RLS who presents to the clinic for follow up.   She is doing well. She is checking her morning sugars and running 70-110s. She is taking metformin. She denies any hypoglycemic events. No open sores or wounds. No CP, palpitations, headaches. Her home BPs are running as high as 180s over 90s at home. She is walking regularly. She wonders why she can't lose more weight. She would like to stop any medication that could be promoting weight loss.   .. Active Ambulatory Problems    Diagnosis Date Noted   Type 2 diabetes mellitus with complication, without long-term current use of insulin ([place]) 11/09/2007   SCHATZKI'S RING 11/09/2007   CHOLELITHIASIS, HX OF 11/09/2007   Diverticulosis 06/07/2013   Raynaud's syndrome 08/09/2013   BPPV (benign paroxysmal positional vertigo), right 11/02/2014   Microalbuminuria 11/02/2014   Hyperlipidemia LDL goal <70 11/03/2014   Seborrheic dermatitis 03/14/2015   Memory changes 05/05/2015   Insomnia 08/08/2015   Bilateral leg pain 08/08/2015   Shoulder bursitis 08/08/2015   Avitaminosis D 10/09/2012   Colon polyp 04/24/2011   RLS (restless legs syndrome) 03/29/2016   Chronic right shoulder pain 04/01/2016   Cataract cortical, senile, left 06/04/2016   Elevated serum creatinine 12/10/2016   Primary osteoarthritis of both knees 06/26/2017   Carpal tunnel syndrome, right 06/26/2017   Dry skin dermatitis 06/26/2017   Fear of flying 09/28/2017   Orthostatic hypotension 12/16/2017   Syncope and collapse 12/16/2017   Mitral regurgitation 12/19/2017   Tricuspid regurgitation 12/19/2017   Raynaud's phenomenon without gangrene 02/03/2018   Concussion 12/08/2017   Essential hypertension 12/10/2017   Combined hyperlipidemia  associated with type 2 diabetes mellitus ([place]) 04/24/2011   Itching 04/27/2018   Chronic pain of both ankles 04/27/2018   Bilateral carpal tunnel syndrome 04/27/2018   Statin intolerance 07/03/2018   Leg cramping 09/03/2019   Toe pain, right 12/01/2019   Degenerative disc disease, cervical 12/07/2019   Myalgia 03/14/2020   Iron deficiency anemia 06/13/2020   Facet arthritis, degenerative, lumbar spine 06/28/2020   Acute left-sided low back pain with left-sided sciatica 06/28/2020   DDD (degenerative disc disease), lumbar 12/12/2020   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 03/13/2021   Stage 3a chronic kidney disease ([place]) 03/14/2021   Squamous cell carcinoma in situ 03/19/2021   Gastroesophageal reflux disease without esophagitis 06/13/2021   Change in consistency of stool 06/13/2021   Left lumbar radiculitis 07/31/2021   Irritable bowel syndrome with diarrhea 12/24/2021   Mild cognitive impairment 01/14/2022   Elevated vitamin B12 level 02/13/2022   Decreased GFR 02/13/2022   Trouble in sleeping 06/26/2022   Type 2 diabetes mellitus with stage 3a chronic kidney disease, without long-term current use of insulin ([place]) 06/26/2022   Thyroid disorder screening 09/27/2022   Resolved Ambulatory Problems    Diagnosis Date Noted   Eustachian tube dysfunction 05/02/2014   Tennis elbow syndrome 03/29/2016   Right shoulder pain 03/29/2016   Tricompartment osteoarthritis of knees, bilateral 06/25/2017   Trigger finger, left ring finger 06/26/2017   Controlled type 2 diabetes mellitus without complication, without long-term current use of insulin ([place]) 06/26/2017   Trochanteric bursitis 06/27/2017   Flatulence 09/28/2017   Nausea 12/16/2017   Hematoma  and contusion 12/16/2017   Paronychia of great toe, left 02/07/2020   Ingrown toenail of right foot 03/14/2020   Osteoarthritis of patellofemoral joints, bilateral 03/14/2021   Past Medical History:  Diagnosis Date   DIABETES MELLITUS,  TYPE II 11/09/2007   Hyperlipidemia      Review of Systems  All other systems reviewed and are negative.     Objective:     BP (!) 175/86 Comment: home blood pressure machine  Pulse 74   Ht 5' 6"$  (1.676 m)   Wt 158 lb (71.7 kg)   LMP 08/12/2000   SpO2 96%   BMI 25.50 kg/m  BP Readings from Last 3 Encounters:  09/27/22 (!) 175/86  06/26/22 (!) 162/76  03/26/22 (!) 148/76   Wt Readings from Last 3 Encounters:  09/27/22 158 lb (71.7 kg)  06/26/22 158 lb (71.7 kg)  03/26/22 161 lb (73 kg)    .[name] Results for orders placed or performed in visit on 09/27/22  POCT glycosylated hemoglobin (Hb A1C)  Result Value Ref Range   Hemoglobin A1C 5.8 (A) 4.0 - 5.6 %   HbA1c POC (<> result, manual entry)     HbA1c, POC (prediabetic range)     HbA1c, POC (controlled diabetic range)       Physical Exam Constitutional:      Appearance: Normal appearance.  HENT:     Head: Normocephalic.  Cardiovascular:     Rate and Rhythm: Normal rate and regular rhythm.     Pulses: Normal pulses.     Heart sounds: Murmur heard.  Musculoskeletal:     Right lower leg: No edema.     Left lower leg: No edema.  Neurological:     General: No focal deficit present.     Mental Status: She is alert and oriented to person, place, and time.  Psychiatric:        Mood and Affect: Mood normal.    .. Diabetic Foot Exam - Simple   Simple Foot Form Diabetic Foot exam was performed with the following findings: Yes 09/27/2022  8:20 AM  Visual Inspection No deformities, no ulcerations, no other skin breakdown bilaterally: Yes Sensation Testing Intact to touch and monofilament testing bilaterally: Yes Pulse Check Posterior Tibialis and Dorsalis pulse intact bilaterally: Yes Comments    .[name] Results for orders placed or performed in visit on 09/27/22  POCT glycosylated hemoglobin (Hb A1C)  Result Value Ref Range   Hemoglobin A1C 5.8 (A) 4.0 - 5.6 %   HbA1c POC (<> result, manual entry)     HbA1c, POC  (prediabetic range)     HbA1c, POC (controlled diabetic range)        The 10-year ASCVD risk score (Arnett DK, et al., 2019) is: 43.7%    Assessment & Plan:  [name] was seen today for follow-up.  Diagnoses and all orders for this visit:  Type 2 diabetes mellitus with stage 3a chronic kidney disease, without long-term current use of insulin (HCC) -     POCT glycosylated hemoglobin (Hb A1C) -     Lipid Panel w/reflex Direct LDL -     COMPLETE METABOLIC PANEL WITH GFR -     TSH -     CBC w/Diff/Platelet -     AMB Referral to Pharmacy Medication Management  Hyperlipidemia LDL goal <70 -     Lipid Panel w/reflex Direct LDL -     AMB Referral to Pharmacy Medication Management  Essential hypertension -     COMPLETE METABOLIC  PANEL WITH GFR -     lisinopril (ZESTRIL) 20 MG tablet; Take 1 tablet (20 mg total) by mouth daily. -     AMB Referral to Pharmacy Medication Management  Thyroid disorder screening -     TSH  RLS (restless legs syndrome)  Statin intolerance -     AMB Referral to Pharmacy Medication Management  Other orders -     Discontinue: lisinopril (ZESTRIL) 20 MG tablet; Take 1 tablet (20 mg total) by mouth daily. -     Discontinue: lisinopril (ZESTRIL) 20 MG tablet; Take 1 tablet (20 mg total) by mouth daily.   A1C is 5.8 and controlled Continue metformin BP not to goal increased lisinopril to 55m home cuff is reading 241mg more than our reading consider getting another cuff Pt is not going to be able to afford praulent or repatha, referral made to population health to help with this, pt is statin intolerance Foot exam UTD Reviewed med list Vaccines UTD Follow up in 3 months  She is not taking hydroxyzine or protonix. Taken off med list.    Return in about 3 months (around 12/26/2022).    JaIran PlanasPA-C

## 2022-09-27 NOTE — Patient Instructions (Signed)
Increase lisinopril to 9m

## 2022-09-28 LAB — COMPLETE METABOLIC PANEL WITH GFR
AG Ratio: 1.6 (calc) (ref 1.0–2.5)
ALT: 11 U/L (ref 6–29)
AST: 15 U/L (ref 10–35)
Albumin: 4.4 g/dL (ref 3.6–5.1)
Alkaline phosphatase (APISO): 40 U/L (ref 37–153)
BUN: 13 mg/dL (ref 7–25)
CO2: 26 mmol/L (ref 20–32)
Calcium: 9.7 mg/dL (ref 8.6–10.4)
Chloride: 101 mmol/L (ref 98–110)
Creat: 0.96 mg/dL (ref 0.60–1.00)
Globulin: 2.7 g/dL (calc) (ref 1.9–3.7)
Glucose, Bld: 103 mg/dL — ABNORMAL HIGH (ref 65–99)
Potassium: 4.5 mmol/L (ref 3.5–5.3)
Sodium: 136 mmol/L (ref 135–146)
Total Bilirubin: 0.4 mg/dL (ref 0.2–1.2)
Total Protein: 7.1 g/dL (ref 6.1–8.1)
eGFR: 63 mL/min/{1.73_m2} (ref >=60)

## 2022-09-28 LAB — CBC WITH DIFFERENTIAL/PLATELET
Absolute Monocytes: 348 cells/uL (ref 200–950)
Basophils Absolute: 52 cells/uL (ref 0–200)
Basophils Relative: 1.1 %
Eosinophils Absolute: 240 cells/uL (ref 15–500)
Eosinophils Relative: 5.1 %
HCT: 32.4 % — ABNORMAL LOW (ref 35.0–45.0)
Hemoglobin: 11.1 g/dL — ABNORMAL LOW (ref 11.7–15.5)
Lymphs Abs: 1311 cells/uL (ref 850–3900)
MCH: 32.9 pg (ref 27.0–33.0)
MCHC: 34.3 g/dL (ref 32.0–36.0)
MCV: 96.1 fL (ref 80.0–100.0)
MPV: 10.1 fL (ref 7.5–12.5)
Monocytes Relative: 7.4 %
Neutro Abs: 2750 cells/uL (ref 1500–7800)
Neutrophils Relative %: 58.5 %
Platelets: 242 10*3/uL (ref 140–400)
RBC: 3.37 10*6/uL — ABNORMAL LOW (ref 3.80–5.10)
RDW: 12.2 % (ref 11.0–15.0)
Total Lymphocyte: 27.9 %
WBC: 4.7 10*3/uL (ref 3.8–10.8)

## 2022-09-28 LAB — LIPID PANEL W/REFLEX DIRECT LDL
Cholesterol: 240 mg/dL — ABNORMAL HIGH (ref <200)
HDL: 67 mg/dL (ref >=50)
LDL Cholesterol (Calc): 152 mg/dL (calc) — ABNORMAL HIGH
Non-HDL Cholesterol (Calc): 173 mg/dL (calc) — ABNORMAL HIGH (ref <130)
Total CHOL/HDL Ratio: 3.6 (calc) (ref <5.0)
Triglycerides: 101 mg/dL (ref <150)

## 2022-09-28 LAB — TSH: TSH: 4.05 mIU/L (ref 0.40–4.50)

## 2022-09-30 NOTE — Progress Notes (Signed)
Ann Griffith,   Kidney function improved. GREAT news.  Liver function looks great.  Need to stay on iron supplement and keep increasing iron rich foods. Your hemoglobin is still low. Make sure taking iron supplement with Vitamin C (orange juice) for better absorption.  Thyroid in normal range but upper limits of normal for TSH. Will continue to watch.  LDL up quite a bit from last year.  HDL still looks good.  We will work on getting praulent/repatha approved.

## 2022-10-01 ENCOUNTER — Other Ambulatory Visit: Payer: Self-pay | Admitting: Pharmacist

## 2022-10-01 NOTE — Patient Instructions (Signed)
Meyer,  I have [place] scheduled for a phone appointment on Thursday Feb 22, at 10:00am. We will discuss medications and affordability.  Talk to you soon! [name], PharmD Clinical Pharmacist [facility] [phone]

## 2022-10-01 NOTE — Progress Notes (Signed)
Praulent is not as effective as a statin to lower LDL but better than nothing. I have referral in for pharmacist to call you. Have they?   If you could get iron in forms other than red meat would prefer that so that would not be as likely to increase cholesterol.

## 2022-10-01 NOTE — Progress Notes (Signed)
Care Coordination Call  Completed outreach in response to referral placed by PCP for medication access.   Introduced services to patient and scheduled initial pharmacist phone appt for: 10/03/22 at 10:00am  Larinda Buttery, PharmD Clinical Pharmacist [facility] [place] [phone]

## 2022-10-03 ENCOUNTER — Other Ambulatory Visit: Payer: Medicare HMO | Admitting: Pharmacist

## 2022-10-03 NOTE — Progress Notes (Signed)
10/03/2022 Name: Ann Griffith MRN: [id number] DOB: 1951/02/12  Chief Complaint  Patient presents with   Hyperlipidemia   Medication Assistance    Ann Griffith is a 72 y.o. year old female who presented for a telephone visit.   They were referred to the pharmacist by their PCP for assistance in managing hyperlipidemia and medication access.   Subjective:  Care Team: Primary Care Provider: Lavada Griffith ; Next Scheduled Visit: 12/27/22   Medication Access/Adherence  Current Pharmacy:  [id number] [place] [id number]: [phone]: [phone] [id number] [place] [id number] [id number]: [phone]: [phone] Hyperlipidemia/ASCVD Risk Reduction  Current lipid lowering medications:  Medications tried in the past: atorvastatin, rosuvastatin, simvastatin, pitavastatin, Praluent currently $145 for 3 months    Objective:  Lab Results  Component Value Date   HGBA1C 5.8 (A) 09/27/2022    Lab Results  Component Value Date   CREATININE 0.96 09/27/2022   BUN 13 09/27/2022   NA 136 09/27/2022   K 4.5 09/27/2022   CL 101 09/27/2022   CO2 26 09/27/2022    Lab Results  Component Value Date   CHOL 240 (H) 09/27/2022   HDL 67 09/27/2022   LDLCALC 152 (H) 09/27/2022   TRIG 101 09/27/2022   CHOLHDL 3.6 09/27/2022    Medications Reviewed Today     Reviewed by KlDarius BumpRPDeltonPharmacist) on 10/03/22 at 1035  Med List Status: <None>   Medication Order Taking? Sig Documenting Provider Last Dose Status Informant  Alirocumab (PRALUENT) 150 MG/ML SOAJ [id number] Inject 15086mnto skin every 14 days. BreDonella StadeA-C Taking Active   ALPHA LIPOIC ACID PO [id number] Take by mouth daily. [provider] Taking Active   AMBCamillo FlamingDICATION [id number]  Single glucometer with lancets, test strips Breeback, Jade L, PA-C Taking Active   blood glucose meter kit and supplies [id number] Dispense based on patient and insurance preference. Use up to four times daily as directed. (FOR ICD-10 E10.9, E11.9). BreDonella StadeA-C Taking Active   Cyanocobalamin (VITAMIN B-12 PO) [id number] Take 5,000 mcg by mouth once a week. [provider] Taking Active   diclofenac (VOLTAREN) 75 MG EC tablet [id number] TAKE 1 TABLET BY MOUTH TWICE A DAY Breeback, Jade L, PA-C Taking Active   diclofenac Sodium (VOLTAREN) 1 % GEL [id number] APPLY 4 GRAMS 4 TIMES DAILY TOPICALLY. TO AFFECTED JOINT. BreDonella StadeA-C Taking Active   ferrous sulfate 325 (65 FE) MG EC tablet [id number] TAKE 1 TABLET BY MOUTH EVERY DAY WITH BREAKFAST BreDonella StadeA-C Taking Active   FIBER PO [id number] Take by mouth once a week. [provider] Taking Active   fish oil-omega-3 fatty acids 1000 MG capsule [id number] Take 1 g by mouth daily. [provider] Taking Active   glucose blood (ONETOUCH VERIO) test strip [id number] Dx DM E11.9. Check blood sugar up to 3 times daily. BreDonella StadeA-C Taking Active   Lancets (ONSouth Shore Allenton LLCTRASOFT) lancets [id number] Use as instructed BreDonella StadeA-C Taking Active   lisinopril (ZESTRIL) 20 MG tablet [id number] Take 1 tablet (20 mg total) by mouth daily. BreLavada Mesiking Active   MAGNESIUM PO [id number] Take 250 mg by mouth daily. [provider] Taking Active   metFORMIN (GLUCOPHAGE) 1000 MG tablet [id number] Yes TAKE 1 TABLET (1,000 MG TOTAL) BY MOUTH TWICE A DAY WITH FOOD Breeback, Jade L, PA-C Taking Active   Probiotic Product (PROBIOTIC PO) [id number] Yes Take by mouth daily. [provider] Taking Active   rOPINIRole (REQUIP) 0.5 MG tablet [id number] Yes TAKE 1 TABLET BY MOUTH THREE TIMES A DAY Breeback, Jade L, PA-C Taking Active   triamcinolone  cream (KENALOG) 0.1 % [id number] Yes APPLY TO AFFECTED AREA TWICE A DAY Donella Stade, PA-C Taking Active   TURMERIC PO [id number] Yes Take 500 mg by mouth daily. [provider] Taking Active   VITAMIN D PO [id number] Yes Take 5,000 Int'l Units by mouth daily. [provider] Taking Active   Med List Note Ann Griffith 07/31/21 [id number]                Assessment/Plan:   Hyperlipidemia/ASCVD Risk Reduction: - Currently uncontrolled. Insurance no longer covering praluent, uncertain status of repatha coverage based on patient report, will submit prior auth and apply for funding via [employer] to continue current medications for now, arranging access for repatha as described above   Follow Up Plan: 2 weeks  Larinda Buttery, PharmD Clinical Pharmacist [facility] [phone]

## 2022-10-03 NOTE — Patient Instructions (Signed)
[name],  [place] speaking with you today. Continue current medicines for now, I am arranging the steps to get repatha into an affordable range for you.  We will touch base in a few weeks to review progress of the prior authorization and possible grant funding.  Take care,  [name], PharmD Clinical Pharmacist [facility] [phone]

## 2022-10-10 ENCOUNTER — Encounter: Payer: Self-pay | Admitting: Physician Assistant

## 2022-10-11 MED ORDER — REPATHA 140 MG/ML ~~LOC~~ SOSY: 140.0000 mg | PREFILLED_SYRINGE | SUBCUTANEOUS | 5 refills | Status: DC

## 2022-10-16 ENCOUNTER — Other Ambulatory Visit: Payer: Self-pay | Admitting: Pharmacist

## 2022-10-16 NOTE — Progress Notes (Signed)
Care Coordination Call   Contacted patient- repatha approved via prior auth under patient insurance. She has one injection remaining of praluent for 10/18/22, then will switch to repatha.  Provided patient guidance to pick up repatha RX at pharmacy, and contact me via mychart if still a cost burden - we will then submit for [employer] for additional funding if need be.  Patient verbalized understanding.  Larinda Buttery, PharmD Clinical Pharmacist [facility] [phone]

## 2022-10-17 ENCOUNTER — Other Ambulatory Visit: Payer: Medicare HMO | Admitting: Pharmacist

## 2022-10-17 DIAGNOSIS — L578 Other skin changes due to chronic exposure to nonionizing radiation: Secondary | ICD-10-CM | POA: Diagnosis not present

## 2022-10-17 DIAGNOSIS — L821 Other seborrheic keratosis: Secondary | ICD-10-CM | POA: Diagnosis not present

## 2022-10-17 DIAGNOSIS — D225 Melanocytic nevi of trunk: Secondary | ICD-10-CM | POA: Diagnosis not present

## 2022-10-17 DIAGNOSIS — L814 Other melanin hyperpigmentation: Secondary | ICD-10-CM | POA: Diagnosis not present

## 2022-10-17 DIAGNOSIS — L570 Actinic keratosis: Secondary | ICD-10-CM | POA: Diagnosis not present

## 2022-10-22 ENCOUNTER — Other Ambulatory Visit: Payer: Self-pay | Admitting: Physician Assistant

## 2022-10-22 DIAGNOSIS — G479 Sleep disorder, unspecified: Secondary | ICD-10-CM

## 2022-11-12 DIAGNOSIS — Z791 Long term (current) use of non-steroidal anti-inflammatories (NSAID): Secondary | ICD-10-CM | POA: Diagnosis not present

## 2022-11-12 DIAGNOSIS — Z87892 Personal history of anaphylaxis: Secondary | ICD-10-CM | POA: Diagnosis not present

## 2022-11-12 DIAGNOSIS — E1122 Type 2 diabetes mellitus with diabetic chronic kidney disease: Secondary | ICD-10-CM | POA: Diagnosis not present

## 2022-11-12 DIAGNOSIS — Z008 Encounter for other general examination: Secondary | ICD-10-CM | POA: Diagnosis not present

## 2022-11-12 DIAGNOSIS — M1990 Unspecified osteoarthritis, unspecified site: Secondary | ICD-10-CM | POA: Diagnosis not present

## 2022-11-12 DIAGNOSIS — Z7984 Long term (current) use of oral hypoglycemic drugs: Secondary | ICD-10-CM | POA: Diagnosis not present

## 2022-11-12 DIAGNOSIS — E785 Hyperlipidemia, unspecified: Secondary | ICD-10-CM | POA: Diagnosis not present

## 2022-11-12 DIAGNOSIS — N182 Chronic kidney disease, stage 2 (mild): Secondary | ICD-10-CM | POA: Diagnosis not present

## 2022-11-12 DIAGNOSIS — Z880 Allergy status to penicillin: Secondary | ICD-10-CM | POA: Diagnosis not present

## 2022-11-12 DIAGNOSIS — Z85828 Personal history of other malignant neoplasm of skin: Secondary | ICD-10-CM | POA: Diagnosis not present

## 2022-11-12 DIAGNOSIS — D631 Anemia in chronic kidney disease: Secondary | ICD-10-CM | POA: Diagnosis not present

## 2022-11-12 DIAGNOSIS — G2581 Restless legs syndrome: Secondary | ICD-10-CM | POA: Diagnosis not present

## 2022-11-12 DIAGNOSIS — Z961 Presence of intraocular lens: Secondary | ICD-10-CM | POA: Diagnosis not present

## 2022-11-15 ENCOUNTER — Other Ambulatory Visit: Payer: Self-pay | Admitting: Physician Assistant

## 2022-11-15 DIAGNOSIS — G2581 Restless legs syndrome: Secondary | ICD-10-CM

## 2022-12-08 ENCOUNTER — Other Ambulatory Visit: Payer: Self-pay | Admitting: Physician Assistant

## 2022-12-08 DIAGNOSIS — R252 Cramp and spasm: Secondary | ICD-10-CM

## 2022-12-08 DIAGNOSIS — G2581 Restless legs syndrome: Secondary | ICD-10-CM

## 2022-12-23 ENCOUNTER — Other Ambulatory Visit: Payer: Self-pay | Admitting: Physician Assistant

## 2022-12-23 DIAGNOSIS — I10 Essential (primary) hypertension: Secondary | ICD-10-CM

## 2022-12-27 ENCOUNTER — Ambulatory Visit (INDEPENDENT_AMBULATORY_CARE_PROVIDER_SITE_OTHER): Payer: Medicare HMO | Admitting: Physician Assistant

## 2022-12-27 ENCOUNTER — Encounter: Payer: Self-pay | Admitting: Physician Assistant

## 2022-12-27 VITALS — BP 140/62 | HR 64 | Ht 66.0 in | Wt 153.0 lb

## 2022-12-27 DIAGNOSIS — N1831 Chronic kidney disease, stage 3a: Secondary | ICD-10-CM | POA: Diagnosis not present

## 2022-12-27 DIAGNOSIS — D509 Iron deficiency anemia, unspecified: Secondary | ICD-10-CM

## 2022-12-27 DIAGNOSIS — I10 Essential (primary) hypertension: Secondary | ICD-10-CM

## 2022-12-27 DIAGNOSIS — L853 Xerosis cutis: Secondary | ICD-10-CM | POA: Diagnosis not present

## 2022-12-27 DIAGNOSIS — E785 Hyperlipidemia, unspecified: Secondary | ICD-10-CM

## 2022-12-27 DIAGNOSIS — Z7984 Long term (current) use of oral hypoglycemic drugs: Secondary | ICD-10-CM

## 2022-12-27 DIAGNOSIS — E1122 Type 2 diabetes mellitus with diabetic chronic kidney disease: Secondary | ICD-10-CM | POA: Diagnosis not present

## 2022-12-27 DIAGNOSIS — M7632 Iliotibial band syndrome, left leg: Secondary | ICD-10-CM | POA: Diagnosis not present

## 2022-12-27 LAB — POCT GLYCOSYLATED HEMOGLOBIN (HGB A1C): Hemoglobin A1C: 5.5 % (ref 4.0–5.6)

## 2022-12-27 MED ORDER — METFORMIN HCL 1000 MG PO TABS
ORAL_TABLET | ORAL | 1 refills | Status: DC
Start: 2022-12-27 — End: 2023-08-12

## 2022-12-27 MED ORDER — TRIAMCINOLONE ACETONIDE 0.1 % EX CREA
TOPICAL_CREAM | CUTANEOUS | 1 refills | Status: DC
Start: 2022-12-27 — End: 2023-04-04

## 2022-12-27 MED ORDER — LISINOPRIL 40 MG PO TABS
40.0000 mg | ORAL_TABLET | Freq: Every day | ORAL | 0 refills | Status: DC
Start: 2022-12-27 — End: 2023-03-31

## 2022-12-27 NOTE — Progress Notes (Addendum)
Established Patient Office Visit  Subjective   Patient ID: Ann Griffith, female    DOB: July 10, 1951  Age: 72 y.o. MRN: [id number]  Chief Complaint  Patient presents with   Diabetes    HPI Pt is a 72 yo female with T2DM, HTN, HLD, IDA who presents to the clinic for 3 month follow up.   Insurance does not cover prescription iron and wants to know what kind to get.   Pt is checking sugars and 90-120 in morning. On metformin. No concerns. No hypoglycemic events. No open wounds or sores. Denies any CP, headaches, sOb, or vision changes. One episode of "heart flutter" the other day and never happened again. She has been using repatha every 2 weeks with no problems. BP readings at home in 160s over 70s.    She is having left lateral leg and left ankle pain at night. The pain is bad at night. She sleeps on her left side and cannot sleep any other way. Left lateral leg is tight during the day but not like at night.     .. Active Ambulatory Problems    Diagnosis Date Noted   Type 2 diabetes mellitus with complication, without long-term current use of insulin (HCC) 11/09/2007   SCHATZKI'S RING 11/09/2007   CHOLELITHIASIS, HX OF 11/09/2007   Diverticulosis 06/07/2013   Raynaud's syndrome 08/09/2013   BPPV (benign paroxysmal positional vertigo), right 11/02/2014   Microalbuminuria 11/02/2014   Hyperlipidemia LDL goal <70 11/03/2014   Seborrheic dermatitis 03/14/2015   Memory changes 05/05/2015   Insomnia 08/08/2015   Bilateral leg pain 08/08/2015   Shoulder bursitis 08/08/2015   Avitaminosis D 10/09/2012   Colon polyp 04/24/2011   RLS (restless legs syndrome) 03/29/2016   Chronic right shoulder pain 04/01/2016   Cataract cortical, senile, left 06/04/2016   Elevated serum creatinine 12/10/2016   Primary osteoarthritis of both knees 06/26/2017   Carpal tunnel syndrome, right 06/26/2017   Dry skin dermatitis 06/26/2017   Fear of flying 09/28/2017   Orthostatic hypotension  12/16/2017   Syncope and collapse 12/16/2017   Mitral regurgitation 12/19/2017   Tricuspid regurgitation 12/19/2017   Raynaud's phenomenon without gangrene 02/03/2018   Concussion 12/08/2017   Essential hypertension 12/10/2017   Combined hyperlipidemia associated with type 2 diabetes mellitus (HCC) 04/24/2011   Itching 04/27/2018   Chronic pain of both ankles 04/27/2018   Bilateral carpal tunnel syndrome 04/27/2018   Statin intolerance 07/03/2018   Leg cramping 09/03/2019   Toe pain, right 12/01/2019   Degenerative disc disease, cervical 12/07/2019   Myalgia 03/14/2020   Iron deficiency anemia 06/13/2020   Facet arthritis, degenerative, lumbar spine 06/28/2020   Acute left-sided low back pain with left-sided sciatica 06/28/2020   DDD (degenerative disc disease), lumbar 12/12/2020   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 03/13/2021   Stage 3a chronic kidney disease (HCC) 03/14/2021   Squamous cell carcinoma in situ 03/19/2021   Gastroesophageal reflux disease without esophagitis 06/13/2021   Change in consistency of stool 06/13/2021   Left lumbar radiculitis 07/31/2021   Irritable bowel syndrome with diarrhea 12/24/2021   Mild cognitive impairment 01/14/2022   Elevated vitamin B12 level 02/13/2022   Decreased GFR 02/13/2022   Trouble in sleeping 06/26/2022   Type 2 diabetes mellitus with stage 3a chronic kidney disease, without long-term current use of insulin (HCC) 06/26/2022   Thyroid disorder screening 09/27/2022   It band syndrome, left 12/27/2022   Resolved Ambulatory Problems    Diagnosis Date Noted   Eustachian tube  dysfunction 05/02/2014   Tennis elbow syndrome 03/29/2016   Right shoulder pain 03/29/2016   Tricompartment osteoarthritis of knees, bilateral 06/25/2017   Trigger finger, left ring finger 06/26/2017   Controlled type 2 diabetes mellitus without complication, without long-term current use of insulin (HCC) 06/26/2017   Trochanteric bursitis 06/27/2017    Flatulence 09/28/2017   Nausea 12/16/2017   Hematoma and contusion 12/16/2017   Paronychia of great toe, left 02/07/2020   Ingrown toenail of right foot 03/14/2020   Osteoarthritis of patellofemoral joints, bilateral 03/14/2021   Past Medical History:  Diagnosis Date   DIABETES MELLITUS, TYPE II 11/09/2007   Hyperlipidemia      ROS See HPI.    Objective:     BP (!) 140/62   Pulse 64   Ht 5\' 6"  (1.676 m)   Wt 153 lb (69.4 kg)   LMP 08/12/2000   SpO2 99%   BMI 24.69 kg/m  BP Readings from Last 3 Encounters:  12/27/22 (!) 140/62  09/27/22 (!) 175/86  06/26/22 (!) 162/76   Wt Readings from Last 3 Encounters:  12/27/22 153 lb (69.4 kg)  09/27/22 158 lb (71.7 kg)  06/26/22 158 lb (71.7 kg)    .[name] Results for orders placed or performed in visit on 12/27/22  POCT glycosylated hemoglobin (Hb A1C)  Result Value Ref Range   Hemoglobin A1C 5.5 4.0 - 5.6 %   HbA1c POC (<> result, manual entry)     HbA1c, POC (prediabetic range)     HbA1c, POC (controlled diabetic range)       Physical Exam Constitutional:      Appearance: Normal appearance.  HENT:     Head: Normocephalic.  Cardiovascular:     Rate and Rhythm: Normal rate and regular rhythm.  Pulmonary:     Effort: Pulmonary effort is normal.     Breath sounds: Normal breath sounds.  Musculoskeletal:     Cervical back: Normal range of motion and neck supple. No tenderness.     Right lower leg: No edema.     Left lower leg: No edema.     Comments: NROM of left hip and ankle No tenderness to palpation over greater trochanter and left ankle  Lymphadenopathy:     Cervical: No cervical adenopathy.  Neurological:     General: No focal deficit present.     Mental Status: She is alert and oriented to person, place, and time.  Psychiatric:        Mood and Affect: Mood normal.      The 10-year ASCVD risk score (Arnett DK, et al., 2019) is: 36%    Assessment & Plan:  [name] was seen today for  diabetes.  Diagnoses and all orders for this visit:  Type 2 diabetes mellitus with stage 3a chronic kidney disease, without long-term current use of insulin (HCC) -     POCT glycosylated hemoglobin (Hb A1C) -     metFORMIN (GLUCOPHAGE) 1000 MG tablet; TAKE 1 TABLET (1,000 MG TOTAL) BY MOUTH TWICE A DAY WITH FOOD  Dry skin dermatitis -     triamcinolone cream (KENALOG) 0.1 %; APPLY TO AFFECTED AREA TWICE A DAY  It band syndrome, left  Hyperlipidemia LDL goal <70  Essential hypertension -     lisinopril (ZESTRIL) 40 MG tablet; Take 1 tablet (40 mg total) by mouth daily.  Iron deficiency anemia, unspecified iron deficiency anemia type   OTC iron 60mg  with vitamin C  A1c looks great and to goal.  Continue on metformin  BP not to goal, increased lisinopril to 40mg  daily.  On repatha Foot and eye exam UTD Needs Tdap get at pharmacy Follow up in 3 months  Ok to take tylenol at bedtime Start IT band exercises Use voltaren gel Get sleep leg pillow    Tandy Gaw, PA-C

## 2022-12-27 NOTE — Patient Instructions (Addendum)
Please schedule your Medicare wellness exam with Norma Fredrickson, and get your Tdap done at your local pharmacy at your convenience.  Keep using voltaren gel as needed Consider leg pillow to sleep with and IT band exercises Tylenol PM at night  Iliotibial Band Syndrome Rehab Ask your health care provider which exercises are safe for you. Do exercises exactly as told by your health care provider and adjust them as directed. It is normal to feel mild stretching, pulling, tightness, or discomfort as you do these exercises. Stop right away if you feel sudden pain or your pain gets significantly worse. Do not begin these exercises until told by your health care provider. Stretching and range-of-motion exercises These exercises warm up your muscles and joints and improve the movement and flexibility of your hip and pelvis. Quadriceps stretch, prone  Lie on your abdomen (prone position) on a firm surface, such as a bed or padded floor. Bend your left / right knee and reach back to hold your ankle or pant leg. If you cannot reach your ankle or pant leg, loop a belt around your foot and grab the belt instead. Gently pull your heel toward your buttocks. Your knee should not slide out to the side. You should feel a stretch in the front of your thigh and knee (quadriceps). Hold this position for __________ seconds. Repeat __________ times. Complete this exercise __________ times a day. Iliotibial band stretch An iliotibial band is a strong band of muscle tissue that runs from the outer side of your hip to the outer side of your thigh and knee. Lie on your side with your left / right leg in the top position. Bend both of your knees and grab your left / right ankle. Stretch out your bottom arm to help you balance. Slowly bring your top knee back so your thigh goes behind your trunk. Slowly lower your top leg toward the floor until you feel a gentle stretch on the outside of your left / right hip and thigh. If you do  not feel a stretch and your knee will not fall farther, place the heel of your other foot on top of your knee and pull your knee down toward the floor with your foot. Hold this position for __________ seconds. Repeat __________ times. Complete this exercise __________ times a day. Strengthening exercises These exercises build strength and endurance in your hip and pelvis. Endurance is the ability to use your muscles for a long time, even after they get tired. Straight leg raises, side-lying This exercise strengthens the muscles that rotate the leg at the hip and move it away from your body (hip abductors). Lie on your side with your left / right leg in the top position. Lie so your head, shoulder, hip, and knee line up. You may bend your bottom knee to help you balance. Roll your hips slightly forward so your hips are stacked directly over each other and your left / right knee is facing forward. Tense the muscles in your outer thigh and lift your top leg 4-6 inches (10-15 cm). Hold this position for __________ seconds. Slowly lower your leg to return to the starting position. Let your muscles relax completely before doing another repetition. Repeat __________ times. Complete this exercise __________ times a day. Leg raises, prone This exercise strengthens the muscles that move the hips backward (hip extensors). Lie on your abdomen (prone position) on your bed or a firm surface. You can put a pillow under your hips if that is  more comfortable for your lower back. Bend your left / right knee so your foot is straight up in the air. Squeeze your buttocks muscles and lift your left / right thigh off the bed. Do not let your back arch. Tense your thigh muscle as hard as you can without increasing any knee pain. Hold this position for __________ seconds. Slowly lower your leg to return to the starting position and allow it to relax completely. Repeat __________ times. Complete this exercise __________  times a day. Hip hike Stand sideways on a bottom step. Stand on your left / right leg with your other foot unsupported next to the step. You can hold on to a railing or wall for balance if needed. Keep your knees straight and your torso square. Then lift your left / right hip up toward the ceiling. Slowly let your left / right hip lower toward the floor, past the starting position. Your foot should get closer to the floor. Do not lean or bend your knees. Repeat __________ times. Complete this exercise __________ times a day. This information is not intended to replace advice given to you by your health care provider. Make sure you discuss any questions you have with your health care provider. Document Revised: 10/06/2019 Document Reviewed: 10/06/2019 Elsevier Patient Education  2023 [employer].

## 2023-02-09 ENCOUNTER — Other Ambulatory Visit: Payer: Self-pay | Admitting: Physician Assistant

## 2023-02-09 DIAGNOSIS — G2581 Restless legs syndrome: Secondary | ICD-10-CM

## 2023-03-07 ENCOUNTER — Telehealth: Payer: Self-pay | Admitting: Physician Assistant

## 2023-03-07 NOTE — Telephone Encounter (Signed)
Patient called requesting a refill of rOPINIRole (REQUIP) 0.5 MG tablet [[id number]]   Patient stated a refill was sent 02/10/23 however the pharmacy did not receive the refill.

## 2023-03-12 ENCOUNTER — Other Ambulatory Visit: Payer: Self-pay | Admitting: Physician Assistant

## 2023-03-12 DIAGNOSIS — G479 Sleep disorder, unspecified: Secondary | ICD-10-CM

## 2023-03-24 ENCOUNTER — Other Ambulatory Visit: Payer: Self-pay | Admitting: Physician Assistant

## 2023-03-24 DIAGNOSIS — I10 Essential (primary) hypertension: Secondary | ICD-10-CM

## 2023-03-31 ENCOUNTER — Other Ambulatory Visit: Payer: Self-pay | Admitting: Physician Assistant

## 2023-03-31 ENCOUNTER — Ambulatory Visit: Payer: Medicare HMO | Admitting: Physician Assistant

## 2023-03-31 ENCOUNTER — Encounter: Payer: Self-pay | Admitting: Physician Assistant

## 2023-03-31 VITALS — BP 140/70 | HR 70 | Ht 62.0 in | Wt 154.0 lb

## 2023-03-31 DIAGNOSIS — E785 Hyperlipidemia, unspecified: Secondary | ICD-10-CM

## 2023-03-31 DIAGNOSIS — S60419A Abrasion of unspecified finger, initial encounter: Secondary | ICD-10-CM

## 2023-03-31 DIAGNOSIS — N1831 Chronic kidney disease, stage 3a: Secondary | ICD-10-CM

## 2023-03-31 DIAGNOSIS — Z7984 Long term (current) use of oral hypoglycemic drugs: Secondary | ICD-10-CM | POA: Diagnosis not present

## 2023-03-31 DIAGNOSIS — Z23 Encounter for immunization: Secondary | ICD-10-CM

## 2023-03-31 DIAGNOSIS — I10 Essential (primary) hypertension: Secondary | ICD-10-CM | POA: Diagnosis not present

## 2023-03-31 DIAGNOSIS — R809 Proteinuria, unspecified: Secondary | ICD-10-CM | POA: Diagnosis not present

## 2023-03-31 DIAGNOSIS — Z789 Other specified health status: Secondary | ICD-10-CM

## 2023-03-31 DIAGNOSIS — E1122 Type 2 diabetes mellitus with diabetic chronic kidney disease: Secondary | ICD-10-CM | POA: Diagnosis not present

## 2023-03-31 LAB — POCT GLYCOSYLATED HEMOGLOBIN (HGB A1C): Hemoglobin A1C: 5.8 % — AB (ref 4.0–5.6)

## 2023-03-31 LAB — POCT UA - MICROALBUMIN
Creatinine, POC: 300 mg/dL
Microalbumin Ur, POC: 80 mg/L

## 2023-03-31 MED ORDER — LISINOPRIL 40 MG PO TABS
40.0000 mg | ORAL_TABLET | Freq: Every day | ORAL | 0 refills | Status: DC
Start: 2023-03-31 — End: 2023-05-21

## 2023-03-31 MED ORDER — HYDROCHLOROTHIAZIDE 12.5 MG PO TABS: 12.5000 mg | ORAL_TABLET | Freq: Every day | ORAL | 2 refills | Status: DC

## 2023-03-31 NOTE — Progress Notes (Unsigned)
Established Patient Office Visit  Subjective   Patient ID: Ann Griffith, female    DOB: [birth year]  Age: 72 y.o. MRN: [id number]  Chief Complaint  Patient presents with   Medical Management of Chronic Issues    Last A1c was 5.5      HPI Pt is a 72 yo female with T2DM, HTN, HLD who presents to the clinic for follow up.   She has some abrasions on fingers from gardening. She wants to know who to keep clean and if she should get Tdap.   Denies any CP, palpitations, headaches. She is trying to stay active. Her RLS is controlled on requip. She is taking metformin only. She denies any hypoglycemic events. No high readings when checking at home.   .. Active Ambulatory Problems    Diagnosis Date Noted   Type 2 diabetes mellitus with complication, without long-term current use of insulin (HCC) 11/09/2007   SCHATZKI'S RING 11/09/2007   CHOLELITHIASIS, HX OF 11/09/2007   Diverticulosis 06/07/2013   Raynaud's syndrome 08/09/2013   BPPV (benign paroxysmal positional vertigo), right 11/02/2014   Microalbuminuria 11/02/2014   Hyperlipidemia LDL goal <70 11/03/2014   Seborrheic dermatitis 03/14/2015   Memory changes 05/05/2015   Insomnia 08/08/2015   Bilateral leg pain 08/08/2015   Shoulder bursitis 08/08/2015   Avitaminosis D 10/09/2012   Colon polyp 04/24/2011   RLS (restless legs syndrome) 03/29/2016   Chronic right shoulder pain 04/01/2016   Cataract cortical, senile, left 06/04/2016   Elevated serum creatinine 12/10/2016   Primary osteoarthritis of both knees 06/26/2017   Carpal tunnel syndrome, right 06/26/2017   Dry skin dermatitis 06/26/2017   Fear of flying 09/28/2017   Orthostatic hypotension 12/16/2017   Syncope and collapse 12/16/2017   Mitral regurgitation 12/19/2017   Tricuspid regurgitation 12/19/2017   Raynaud's phenomenon without gangrene 02/03/2018   Concussion 12/08/2017   Essential hypertension 12/10/2017   Combined hyperlipidemia associated with type 2  diabetes mellitus (HCC) 04/24/2011   Itching 04/27/2018   Chronic pain of both ankles 04/27/2018   Bilateral carpal tunnel syndrome 04/27/2018   Statin intolerance 07/03/2018   Leg cramping 09/03/2019   Toe pain, right 12/01/2019   Degenerative disc disease, cervical 12/07/2019   Myalgia 03/14/2020   Iron deficiency anemia 06/13/2020   Facet arthritis, degenerative, lumbar spine 06/28/2020   Acute left-sided low back pain with left-sided sciatica 06/28/2020   DDD (degenerative disc disease), lumbar 12/12/2020   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 03/13/2021   Stage 3a chronic kidney disease (HCC) 03/14/2021   Squamous cell carcinoma in situ 03/19/2021   Gastroesophageal reflux disease without esophagitis 06/13/2021   Change in consistency of stool 06/13/2021   Left lumbar radiculitis 07/31/2021   Irritable bowel syndrome with diarrhea 12/24/2021   Mild cognitive impairment 01/14/2022   Elevated vitamin B12 level 02/13/2022   Decreased GFR 02/13/2022   Trouble in sleeping 06/26/2022   Type 2 diabetes mellitus with stage 3a chronic kidney disease, without long-term current use of insulin (HCC) 06/26/2022   Thyroid disorder screening 09/27/2022   It band syndrome, left 12/27/2022   Resolved Ambulatory Problems    Diagnosis Date Noted   Eustachian tube dysfunction 05/02/2014   Tennis elbow syndrome 03/29/2016   Right shoulder pain 03/29/2016   Tricompartment osteoarthritis of knees, bilateral 06/25/2017   Trigger finger, left ring finger 06/26/2017   Controlled type 2 diabetes mellitus without complication, without long-term current use of insulin (HCC) 06/26/2017   Trochanteric bursitis 06/27/2017   Flatulence  09/28/2017   Nausea 12/16/2017   Hematoma and contusion 12/16/2017   Paronychia of great toe, left 02/07/2020   Ingrown toenail of right foot 03/14/2020   Osteoarthritis of patellofemoral joints, bilateral 03/14/2021   Past Medical History:  Diagnosis Date   DIABETES  MELLITUS, TYPE II 11/09/2007   Hyperlipidemia      ROS See HPI.    Objective:     BP (!) 140/70   Pulse 70   Ht 5\' 2"  (1.575 m)   Wt 154 lb (69.9 kg)   LMP 08/12/2000   SpO2 99%   BMI 28.17 kg/m  BP Readings from Last 3 Encounters:  03/31/23 (!) 140/70  12/27/22 (!) 140/62  09/27/22 (!) 175/86   Wt Readings from Last 3 Encounters:  03/31/23 154 lb (69.9 kg)  12/27/22 153 lb (69.4 kg)  09/27/22 158 lb (71.7 kg)    .[name] Results for orders placed or performed in visit on 03/31/23  Lipid panel  Result Value Ref Range   Cholesterol, Total 237 (H) 100 - 199 mg/dL   Triglycerides 75 0 - 149 mg/dL   HDL 84 >16 mg/dL   VLDL Cholesterol Cal 13 5 - 40 mg/dL   LDL Chol Calc (NIH) 109 (H) 0 - 99 mg/dL   Chol/HDL Ratio 2.8 0.0 - 4.4 ratio  CMP14+EGFR  Result Value Ref Range   Glucose 101 (H) 70 - 99 mg/dL   BUN 20 8 - 27 mg/dL   Creatinine, Ser 6.04 0.57 - 1.00 mg/dL   eGFR 62 >54 UJ/WJX/9.14   BUN/Creatinine Ratio 21 12 - 28   Sodium 133 (L) 134 - 144 mmol/L   Potassium 4.8 3.5 - 5.2 mmol/L   Chloride 97 96 - 106 mmol/L   CO2 21 20 - 29 mmol/L   Calcium 9.4 8.7 - 10.3 mg/dL   Total Protein 6.7 6.0 - 8.5 g/dL   Albumin 4.3 3.8 - 4.8 g/dL   Globulin, Total 2.4 1.5 - 4.5 g/dL   Bilirubin Total 0.3 0.0 - 1.2 mg/dL   Alkaline Phosphatase 54 44 - 121 IU/L   AST 14 0 - 40 IU/L   ALT 12 0 - 32 IU/L  POCT HgB A1C  Result Value Ref Range   Hemoglobin A1C 5.8 (A) 4.0 - 5.6 %   HbA1c POC (<> result, manual entry)     HbA1c, POC (prediabetic range)     HbA1c, POC (controlled diabetic range)    POCT UA - Microalbumin  Result Value Ref Range   Microalbumin Ur, POC 80 mg/L   Creatinine, POC 300 mg/dL   Albumin/Creatinine Ratio, Urine, POC 30-300      Physical Exam Constitutional:      Appearance: Normal appearance.  HENT:     Head: Normocephalic.  Cardiovascular:     Rate and Rhythm: Normal rate and regular rhythm.  Pulmonary:     Effort: Pulmonary effort is normal.      Breath sounds: Normal breath sounds.  Musculoskeletal:     Right lower leg: No edema.     Left lower leg: No edema.  Skin:    Comments: Abrasions over fingers from gardening.   Neurological:     General: No focal deficit present.     Mental Status: She is alert and oriented to person, place, and time.  Psychiatric:        Mood and Affect: Mood normal.       The 10-year ASCVD risk score (Arnett DK, et al., 2019) is: 35.5%  Assessment & Plan:  [name] was seen today for medical management of chronic issues.  Diagnoses and all orders for this visit:  Type 2 diabetes mellitus with stage 3a chronic kidney disease, without long-term current use of insulin (HCC) -     POCT HgB A1C -     POCT UA - Microalbumin -     CMP14+EGFR  Essential hypertension -     POCT UA - Microalbumin -     lisinopril (ZESTRIL) 40 MG tablet; Take 1 tablet (40 mg total) by mouth daily. -     CMP14+EGFR -     hydrochlorothiazide (HYDRODIURIL) 12.5 MG tablet; Take 1 tablet (12.5 mg total) by mouth daily.  Hyperlipidemia LDL goal <70 -     Lipid panel  Multiple abrasions of finger -     Tdap vaccine greater than or equal to 7yo IM  Need for Tdap vaccination -     Tdap vaccine greater than or equal to 7yo IM  Statin intolerance   A1C to goal, controlled Stay on same medications BP not to goal, on ACE, Add hydrochlorothiazide(discussed side effects) Recheck BP in 4 weeks Microalbumin-CMP ordered, on ACE Consider SGLT-2 in future Could not tolerate statin, on repatha Eye and foot exam UTD Will get flu shot at pharmacy  Labs order to check kidneys and cholesterol.   Use OTC neosporin on abrasions and wash hands with soap and water Tdap given today     Return in about 3 months (around 07/01/2023) for due for medicare wellness exam.    Tandy Gaw, PA-C

## 2023-03-31 NOTE — Patient Instructions (Addendum)
Start hydrochlorothiazide daily for BP control.  Recheck in 4 weeks nurse visit  Repatha can stay out of fridge for 30 days  Ferrous sulfate 325mg 

## 2023-04-01 ENCOUNTER — Encounter: Payer: Self-pay | Admitting: Physician Assistant

## 2023-04-01 LAB — CMP14+EGFR
ALT: 12 IU/L (ref 0–32)
AST: 14 IU/L (ref 0–40)
Albumin: 4.3 g/dL (ref 3.8–4.8)
Alkaline Phosphatase: 54 IU/L (ref 44–121)
BUN/Creatinine Ratio: 21 (ref 12–28)
BUN: 20 mg/dL (ref 8–27)
Bilirubin Total: 0.3 mg/dL (ref 0.0–1.2)
CO2: 21 mmol/L (ref 20–29)
Calcium: 9.4 mg/dL (ref 8.7–10.3)
Chloride: 97 mmol/L (ref 96–106)
Creatinine, Ser: 0.97 mg/dL (ref 0.57–1.00)
Globulin, Total: 2.4 g/dL (ref 1.5–4.5)
Glucose: 101 mg/dL — ABNORMAL HIGH (ref 70–99)
Potassium: 4.8 mmol/L (ref 3.5–5.2)
Sodium: 133 mmol/L — ABNORMAL LOW (ref 134–144)
Total Protein: 6.7 g/dL (ref 6.0–8.5)
eGFR: 62 mL/min/{1.73_m2} (ref >59)

## 2023-04-01 LAB — LIPID PANEL
Chol/HDL Ratio: 2.8 ratio (ref 0.0–4.4)
Cholesterol, Total: 237 mg/dL — ABNORMAL HIGH (ref 100–199)
HDL: 84 mg/dL (ref >39)
LDL Chol Calc (NIH): 140 mg/dL — ABNORMAL HIGH (ref 0–99)
Triglycerides: 75 mg/dL (ref 0–149)
VLDL Cholesterol Cal: 13 mg/dL (ref 5–40)

## 2023-04-01 NOTE — Progress Notes (Signed)
Will talk about adding additional medications at next 3 month follow up.

## 2023-04-01 NOTE — Progress Notes (Signed)
Carlie,   LDL went down some but not the drop we were hoping for. You are taking repatha every 2 weeks, correct?

## 2023-04-02 ENCOUNTER — Other Ambulatory Visit: Payer: Self-pay | Admitting: Physician Assistant

## 2023-04-02 DIAGNOSIS — I10 Essential (primary) hypertension: Secondary | ICD-10-CM

## 2023-04-04 ENCOUNTER — Other Ambulatory Visit: Payer: Self-pay | Admitting: Physician Assistant

## 2023-04-04 DIAGNOSIS — L853 Xerosis cutis: Secondary | ICD-10-CM

## 2023-04-09 ENCOUNTER — Ambulatory Visit (INDEPENDENT_AMBULATORY_CARE_PROVIDER_SITE_OTHER): Payer: Medicare HMO | Admitting: Physician Assistant

## 2023-04-09 DIAGNOSIS — Z0000 Encounter for general adult medical examination without abnormal findings: Secondary | ICD-10-CM | POA: Diagnosis not present

## 2023-04-09 NOTE — Progress Notes (Signed)
MEDICARE ANNUAL WELLNESS VISIT  04/09/2023  Telephone Visit Disclaimer This Medicare AWV was conducted by telephone due to national recommendations for restrictions regarding the COVID-19 Pandemic (e.g. social distancing).  I verified, using two identifiers, that I am speaking with Ann Griffith or their authorized healthcare agent. I discussed the limitations, risks, security, and privacy concerns of performing an evaluation and management service by telephone and the potential availability of an in-person appointment in the future. The patient expressed understanding and agreed to proceed.  Location of Patient: Home Location of Provider (nurse):  In the office.  Subjective:    Ann Griffith is a 72 y.o. female patient of Caleen Essex, Lonna Cobb, PA-C who had a [employer] Visit today via telephone. Ann Griffith is Retired and lives with their spouse. she has 1 child. she reports that she is socially active and does interact with friends/family regularly. she is moderately physically active and enjoys travelling and going to the movies.  Patient Care Team: Nolene Ebbs as PCP - General (Family Medicine) Jerene Bears, MD as Consulting Physician (Gynecology) Gabriel Carina, [facility] as Pharmacist (Pharmacist)     04/09/2023    8:22 AM 12/11/2021    9:03 AM 10/06/2020   10:07 AM 04/12/2019    9:01 AM  Advanced Directives  Does Patient Have a Medical Advance Directive? Yes Yes Yes Yes  Type of Advance Directive Living will Living will;Healthcare Power of [employer] Power of [place];Living will Living will  Does patient want to make changes to medical advance directive? No - Patient declined No - Patient declined No - Patient declined No - Patient declined  Copy of Healthcare Power of Attorney in Chart?  No - copy requested No - copy requested     Hospital Utilization Over the Past 12 Months: # of hospitalizations or ER visits: 0 # of surgeries: 0  Review of  Systems    Patient reports that her overall health is unchanged compared to last year.  History obtained from chart review and the patient  Patient Reported Readings (BP, Pulse, CBG, Weight, etc) none Per patient no change in vitals since last visit, unable to obtain new vitals due to telehealth visit  Pain Assessment Pain : No/denies pain     Current Medications & Allergies (verified) Allergies as of 04/09/2023       Reactions   Amoxicillin Swelling   Gabapentin    dizzy   Atorvastatin Other (See Comments)   Memory loss        Medication List        Accurate as of April 09, 2023  8:37 AM. If you have any questions, ask your nurse or doctor.          ALPHA LIPOIC ACID PO Take by mouth daily.   AMBULATORY NON FORMULARY MEDICATION Single glucometer with lancets, test strips   blood glucose meter kit and supplies Dispense based on patient and insurance preference. Use up to four times daily as directed. (FOR ICD-10 E10.9, E11.9).   diclofenac 75 MG EC tablet Commonly known as: VOLTAREN TAKE 1 TABLET BY MOUTH TWICE A DAY   diclofenac Sodium 1 % Gel Commonly known as: VOLTAREN APPLY 4 GRAMS 4 TIMES DAILY TOPICALLY. TO AFFECTED JOINT.   ferrous sulfate 325 (65 FE) MG EC tablet TAKE 1 TABLET BY MOUTH EVERY DAY WITH BREAKFAST   FIBER PO Take by mouth once a week.   fish oil-omega-3 fatty acids 1000 MG capsule Take 1 g  by mouth daily.   hydrochlorothiazide 12.5 MG tablet Commonly known as: HYDRODIURIL Take 1 tablet (12.5 mg total) by mouth daily.   lisinopril 40 MG tablet Commonly known as: ZESTRIL Take 1 tablet (40 mg total) by mouth daily.   MAGNESIUM PO Take 250 mg by mouth daily.   metFORMIN 1000 MG tablet Commonly known as: GLUCOPHAGE TAKE 1 TABLET (1,000 MG TOTAL) BY MOUTH TWICE A DAY WITH FOOD   onetouch ultrasoft lancets Use as instructed   OneTouch Verio test strip Generic drug: glucose blood Dx DM E11.9. Check blood sugar up to 3  times daily.   PROBIOTIC PO Take by mouth daily.   Repatha 140 MG/ML Sosy Generic drug: Evolocumab INJECT 140 MG INTO THE SKIN EVERY 14 (FOURTEEN) DAYS.   rOPINIRole 0.5 MG tablet Commonly known as: REQUIP TAKE 1 TABLET BY MOUTH THREE TIMES A DAY   triamcinolone cream 0.1 % Commonly known as: KENALOG APPLY TO AFFECTED AREA TWICE A DAY   TURMERIC PO Take 500 mg by mouth daily.   VITAMIN B-12 PO Take 5,000 mcg by mouth once a week.   VITAMIN D PO Take 5,000 Int'l Units by mouth daily.        History (reviewed): Past Medical History:  Diagnosis Date   CHOLELITHIASIS, HX OF 11/09/2007   Qualifier: Diagnosis of  By: Alesia Richards     DIABETES MELLITUS, TYPE II 11/09/2007   Qualifier: Diagnosis of  By: Alesia Richards     Diverticulosis    Hyperlipidemia    Past Surgical History:  Procedure Laterality Date   bone spur removed  2003   left heel   CATARACT EXTRACTION Left 02/2016   CHOLECYSTECTOMY     COLONOSCOPY  08/2003   Family History  Problem Relation Age of Onset   Diabetes Mother    Social History   Socioeconomic History   Marital status: Married    Spouse name: Jillyn Hidden   Number of children: 1   Years of education: 12   Highest education level: High school graduate  Occupational History   Occupation: data entry Solicitor in receiving    Comment: Retired  Tobacco Use   Smoking status: Never   Smokeless tobacco: Never  Vaping Use   Vaping status: Never Used  Substance and Sexual Activity   Alcohol use: Yes    Alcohol/week: 0.0 - 1.0 standard drinks of alcohol    Comment: occasionally   Drug use: No   Sexual activity: Yes    Partners: Male    Birth control/protection: Post-menopausal  Other Topics Concern   Not on file  Social History Narrative   Lives with her husband. Likes to go to movies and travel.    Social Determinants of Health   Financial Resource Strain: Low Risk  (04/09/2023)   Overall Financial Resource Strain (CARDIA)     Difficulty of Paying Living Expenses: Not hard at all  Food Insecurity: No Food Insecurity (04/09/2023)   Hunger Vital Sign    Worried About Running Out of Food in the Last Year: Never true    Ran Out of Food in the Last Year: Never true  Transportation Needs: No Transportation Needs (04/09/2023)   PRAPARE - Administrator, Civil Service (Medical): No    Lack of Transportation (Non-Medical): No  Physical Activity: Sufficiently Active (04/09/2023)   Exercise Vital Sign    Days of Exercise per Week: 3 days    Minutes of Exercise per Session: 60 min  Stress:  No Stress Concern Present (04/09/2023)   Ann Griffith of Occupational Health - Occupational Stress Questionnaire    Feeling of Stress : Not at all  Social Connections: Moderately Isolated (04/09/2023)   Social Connection and Isolation Panel [NHANES]    Frequency of Communication with Friends and Family: Once a week    Frequency of Social Gatherings with Friends and Family: More than three times a week    Attends Religious Services: Never    Database administrator or Organizations: No    Attends Banker Meetings: Never    Marital Status: Married    Activities of Daily Living    04/09/2023    8:26 AM  In your present state of health, do you have any difficulty performing the following activities:  Hearing? 0  Vision? 1  Comment feels that she is getting a film over her eye after cataract surgery; she is being followed by her eye doctor.  Difficulty concentrating or making decisions? 0  Walking or climbing stairs? 0  Dressing or bathing? 0  Doing errands, shopping? 0  Preparing Food and eating ? N  Using the Toilet? N  In the past six months, have you accidently leaked urine? N  Do you have problems with loss of bowel control? Y  Managing your Medications? N  Managing your Finances? N  Housekeeping or managing your Housekeeping? N    Patient Education/ Literacy How often do you need to have someone  help you when you read instructions, pamphlets, or other written materials from your doctor or pharmacy?: 1 - Never What is the last grade level you completed in school?: 12th grade  Exercise    Diet Patient reports consuming 3 meals a day and 2 snack(s) a day Patient reports that her primary diet is: Regular Patient reports that she does have regular access to food.   Depression Screen    04/09/2023    8:22 AM 03/31/2023    3:17 PM 03/31/2023    8:14 AM 09/27/2022    8:20 AM 06/26/2022    7:54 AM 03/26/2022    8:30 AM 12/24/2021    8:48 AM  PHQ 2/9 Scores  PHQ - 2 Score 0 0 0 0 0 0 0     Fall Risk    04/09/2023    8:22 AM 09/27/2022    8:20 AM 06/26/2022    7:54 AM 03/26/2022    8:30 AM 12/24/2021    8:49 AM  Fall Risk   Falls in the past year? 0 0 0 0 0  Number falls in past yr: 0 0 0 0 0  Injury with Fall? 0 0 0 0 0  Risk for fall due to : No Fall Risks No Fall Risks No Fall Risks No Fall Risks   Follow up Falls evaluation completed Falls evaluation completed Falls evaluation completed Falls evaluation completed      Objective:  Ann Griffith seemed alert and oriented and she participated appropriately during our telephone visit.  Blood Pressure Weight BMI  BP Readings from Last 3 Encounters:  03/31/23 (!) 140/70  12/27/22 (!) 140/62  09/27/22 (!) 175/86   Wt Readings from Last 3 Encounters:  03/31/23 154 lb (69.9 kg)  12/27/22 153 lb (69.4 kg)  09/27/22 158 lb (71.7 kg)   BMI Readings from Last 1 Encounters:  03/31/23 28.17 kg/m    *Unable to obtain current vital signs, weight, and BMI due to telephone visit type  Hearing/Vision  Ann Griffith did not  seem to have difficulty with hearing/understanding during the telephone conversation Reports that she has had a formal eye exam by an eye care professional within the past year Reports that she has not had a formal hearing evaluation within the past year *Unable to fully assess hearing and vision during telephone  visit type  Cognitive Function:    04/09/2023    8:28 AM 12/11/2021    9:11 AM 10/06/2020   10:15 AM  6CIT Screen  What Year? 0 points 0 points 0 points  What month? 0 points 0 points 0 points  What time? 0 points 0 points 0 points  Count back from 20 0 points 0 points 0 points  Months in reverse 0 points 0 points 0 points  Repeat phrase 0 points 0 points 0 points  Total Score 0 points 0 points 0 points   (Normal:0-7, Significant for Dysfunction: >8)  Normal Cognitive Function Screening: Yes   Immunization & Health Maintenance Record Immunization History  Administered Date(s) Administered   Fluad Quad(high Dose 65+) 05/31/2019, 06/13/2020   Influenza,inj,Quad PF,6+ Mos 05/02/2014, 05/05/2015, 06/04/2016, 06/24/2017, 04/27/2018   Influenza-Unspecified 06/07/2021, 05/06/2022   PFIZER(Purple Top)SARS-COV-2 Vaccination 10/05/2019, 10/26/2019, 04/28/2020   Pneumococcal Conjugate-13 05/05/2015   Pneumococcal Polysaccharide-23 06/04/2016   Tdap 12/21/2009, 10/14/2012, 03/31/2023   Zoster Recombinant(Shingrix) 11/17/2019, 03/28/2020    Health Maintenance  Topic Date Due   COVID-19 Vaccine (4 - 2023-24 season) 04/25/2023 (Originally 04/12/2022)   INFLUENZA VACCINE  11/10/2023 (Originally 03/13/2023)   OPHTHALMOLOGY EXAM  09/19/2023   Colonoscopy  09/23/2023   HEMOGLOBIN A1C  10/01/2023   Diabetic kidney evaluation - eGFR measurement  03/30/2024   Diabetic kidney evaluation - Urine ACR  03/30/2024   FOOT EXAM  03/30/2024   Medicare Annual Wellness (AWV)  04/08/2024   MAMMOGRAM  05/07/2024   DEXA SCAN  05/10/2026   DTaP/Tdap/Td (4 - Td or Tdap) 03/30/2033   Pneumonia Vaccine 33+ Years old  Completed   Hepatitis C Screening  Completed   Zoster Vaccines- Shingrix  Completed   HPV VACCINES  Aged Out       Assessment  This is a routine wellness examination for Ann Griffith.  Health Maintenance: Due or Overdue There are no preventive care reminders to display for this  patient.   Ann Griffith does not need a referral for [employer] Assistance: Care Management:   no Social Work:    no Prescription Assistance:  no Nutrition/Diabetes Education:  no   Plan:  Personalized Goals  Goals Addressed               This Visit's Progress     Patient Stated (pt-stated)        Patient stated that she would like to continue to remain healthy.       Personalized Health Maintenance & Screening Recommendations  Influenza vaccine Mammogram- scheduled for May 13, 2023. Colonoscopy due in Feb, 2025  Lung Cancer Screening Recommended: no (Low Dose CT Chest recommended if Age 51-80 years, 20 pack-year currently smoking OR have quit w/in past 15 years) Hepatitis C Screening recommended: no HIV Screening recommended: no  Advanced Directives: Written information was not prepared per patient's request.  Referrals & Orders No orders of the defined types were placed in this encounter.   Follow-up Plan Follow-up with Jomarie Longs, PA-C as planned Schedule influenza vaccine at the pharmacy or in office nurse visit. Medicare wellness visit in one year.  AVS printed and mailed to the patient.    I have  personally reviewed and noted the following in the patient's chart:   Medical and social history Use of alcohol, tobacco or illicit drugs  Current medications and supplements Functional ability and status Nutritional status Physical activity Advanced directives List of other physicians Hospitalizations, surgeries, and ER visits in previous 12 months Vitals Screenings to include cognitive, depression, and falls Referrals and appointments  In addition, I have reviewed and discussed with Ann Griffith certain preventive protocols, quality metrics, and best practice recommendations. A written personalized care plan for preventive services as well as general preventive health recommendations is available and can be mailed to the patient at her  request.      Modesto Charon, RN BSN  04/09/2023

## 2023-04-09 NOTE — Patient Instructions (Addendum)
MEDICARE ANNUAL WELLNESS VISIT Health Maintenance Summary and Written Plan of Care  Ann Griffith ,  Thank you for allowing me to perform your Medicare Annual Wellness Visit and for your ongoing commitment to your health.   Health Maintenance & Immunization History Health Maintenance  Topic Date Due   COVID-19 Vaccine (4 - 2023-24 season) 04/25/2023 (Originally 04/12/2022)   INFLUENZA VACCINE  11/10/2023 (Originally 03/13/2023)   OPHTHALMOLOGY EXAM  09/19/2023   Colonoscopy  09/23/2023   HEMOGLOBIN A1C  10/01/2023   Diabetic kidney evaluation - eGFR measurement  03/30/2024   Diabetic kidney evaluation - Urine ACR  03/30/2024   FOOT EXAM  03/30/2024   Medicare Annual Wellness (AWV)  04/08/2024   MAMMOGRAM  05/07/2024   DEXA SCAN  05/10/2026   DTaP/Tdap/Td (4 - Td or Tdap) 03/30/2033   Pneumonia Vaccine 24+ Years old  Completed   Hepatitis C Screening  Completed   Zoster Vaccines- Shingrix  Completed   HPV VACCINES  Aged Out   Immunization History  Administered Date(s) Administered   Fluad Quad(high Dose 65+) 05/31/2019, 06/13/2020   Influenza,inj,Quad PF,6+ Mos 05/02/2014, 05/05/2015, 06/04/2016, 06/24/2017, 04/27/2018   Influenza-Unspecified 06/07/2021, 05/06/2022   PFIZER(Purple Top)SARS-COV-2 Vaccination 10/05/2019, 10/26/2019, 04/28/2020   Pneumococcal Conjugate-13 05/05/2015   Pneumococcal Polysaccharide-23 06/04/2016   Tdap 12/21/2009, 10/14/2012, 03/31/2023   Zoster Recombinant(Shingrix) 11/17/2019, 03/28/2020    These are the patient goals that we discussed:  Goals Addressed               This Visit's Progress     Patient Stated (pt-stated)        Patient stated that she would like to continue to remain healthy.         This is a list of Health Maintenance Items that are overdue or due now: Influenza vaccine Mammogram- scheduled for May 13, 2023. Colonoscopy due in Feb, 2025  Orders/Referrals Placed Today: No orders of the defined types were placed  in this encounter.  (Contact our referral department at [phone] if you have not spoken with someone about your referral appointment within the next 5 days)    Follow-up Plan Follow-up with Jomarie Longs, PA-C as planned Schedule influenza vaccine at the pharmacy or in office nurse visit. Medicare wellness visit in one year.  AVS printed and mailed to the patient.       Health Maintenance, Female Adopting a healthy lifestyle and getting preventive care are important in promoting health and wellness. Ask your health care provider about: The right schedule for you to have regular tests and exams. Things you can do on your own to prevent diseases and keep yourself healthy. What should I know about diet, weight, and exercise? Eat a healthy diet  Eat a diet that includes plenty of vegetables, fruits, low-fat dairy products, and lean protein. Do not eat a lot of foods that are high in solid fats, added sugars, or sodium. Maintain a healthy weight Body mass index (BMI) is used to identify weight problems. It estimates body fat based on height and weight. Your health care provider can help determine your BMI and help you achieve or maintain a healthy weight. Get regular exercise Get regular exercise. This is one of the most important things you can do for your health. Most adults should: Exercise for at least 150 minutes each week. The exercise should increase your heart rate and make you sweat (moderate-intensity exercise). Do strengthening exercises at least twice a week. This is in addition to the moderate-intensity  exercise. Spend less time sitting. Even light physical activity can be beneficial. Watch cholesterol and blood lipids Have your blood tested for lipids and cholesterol at 73 years of age, then have this test every 5 years. Have your cholesterol levels checked more often if: Your lipid or cholesterol levels are high. You are older than 73 years of age. You are at high  risk for heart disease. What should I know about cancer screening? Depending on your health history and family history, you may need to have cancer screening at various ages. This may include screening for: Breast cancer. Cervical cancer. Colorectal cancer. Skin cancer. Lung cancer. What should I know about heart disease, diabetes, and high blood pressure? Blood pressure and heart disease High blood pressure causes heart disease and increases the risk of stroke. This is more likely to develop in people who have high blood pressure readings or are overweight. Have your blood pressure checked: Every 3-5 years if you are 77-52 years of age. Every year if you are 54 years old or older. Diabetes Have regular diabetes screenings. This checks your fasting blood sugar level. Have the screening done: Once every three years after age 56 if you are at a normal weight and have a low risk for diabetes. More often and at a younger age if you are overweight or have a high risk for diabetes. What should I know about preventing infection? Hepatitis B If you have a higher risk for hepatitis B, you should be screened for this virus. Talk with your health care provider to find out if you are at risk for hepatitis B infection. Hepatitis C Testing is recommended for: Everyone born from [birth year] through 1966. Anyone with known risk factors for hepatitis C. Sexually transmitted infections (STIs) Get screened for STIs, including gonorrhea and chlamydia, if: You are sexually active and are younger than 73 years of age. You are older than 73 years of age and your health care provider tells you that you are at risk for this type of infection. Your sexual activity has changed since you were last screened, and you are at increased risk for chlamydia or gonorrhea. Ask your health care provider if you are at risk. Ask your health care provider about whether you are at high risk for HIV. Your health care provider may  recommend a prescription medicine to help prevent HIV infection. If you choose to take medicine to prevent HIV, you should first get tested for HIV. You should then be tested every 3 months for as long as you are taking the medicine. Pregnancy If you are about to stop having your period (premenopausal) and you may become pregnant, seek counseling before you get pregnant. Take 400 to 800 micrograms (mcg) of folic acid every day if you become pregnant. Ask for birth control (contraception) if you want to prevent pregnancy. Osteoporosis and menopause Osteoporosis is a disease in which the bones lose minerals and strength with aging. This can result in bone fractures. If you are 41 years old or older, or if you are at risk for osteoporosis and fractures, ask your health care provider if you should: Be screened for bone loss. Take a calcium or vitamin D supplement to lower your risk of fractures. Be given hormone replacement therapy (HRT) to treat symptoms of menopause. Follow these instructions at home: Alcohol use Do not drink alcohol if: Your health care provider tells you not to drink. You are pregnant, may be pregnant, or are planning to become pregnant. If  you drink alcohol: Limit how much you have to: 0-1 drink a day. Know how much alcohol is in your drink. In the [place] one drink equals one 12 oz bottle of beer (355 mL), one 5 oz glass of wine (148 mL), or one 1 oz glass of hard liquor (44 mL). Lifestyle Do not use any products that contain nicotine or tobacco. These products include cigarettes, chewing tobacco, and vaping devices, such as e-cigarettes. If you need help quitting, ask your health care provider. Do not use street drugs. Do not share needles. Ask your health care provider for help if you need support or information about quitting drugs. General instructions Schedule regular health, dental, and eye exams. Stay current with your vaccines. Tell your health care provider  if: You often feel depressed. You have ever been abused or do not feel safe at home. Summary Adopting a healthy lifestyle and getting preventive care are important in promoting health and wellness. Follow your health care provider's instructions about healthy diet, exercising, and getting tested or screened for diseases. Follow your health care provider's instructions on monitoring your cholesterol and blood pressure. This information is not intended to replace advice given to you by your health care provider. Make sure you discuss any questions you have with your health care provider. Document Revised: 12/18/2020 Document Reviewed: 12/18/2020 Elsevier Patient Education  2024 [employer].

## 2023-04-17 DIAGNOSIS — H40022 Open angle with borderline findings, high risk, left eye: Secondary | ICD-10-CM | POA: Diagnosis not present

## 2023-04-21 DIAGNOSIS — H401122 Primary open-angle glaucoma, left eye, moderate stage: Secondary | ICD-10-CM | POA: Diagnosis not present

## 2023-04-22 ENCOUNTER — Other Ambulatory Visit: Payer: Self-pay | Admitting: Physician Assistant

## 2023-05-07 ENCOUNTER — Other Ambulatory Visit: Payer: Self-pay | Admitting: Physician Assistant

## 2023-05-07 DIAGNOSIS — G2581 Restless legs syndrome: Secondary | ICD-10-CM

## 2023-05-13 DIAGNOSIS — Z1231 Encounter for screening mammogram for malignant neoplasm of breast: Secondary | ICD-10-CM | POA: Diagnosis not present

## 2023-05-13 LAB — HM MAMMOGRAPHY

## 2023-05-15 DIAGNOSIS — R195 Other fecal abnormalities: Secondary | ICD-10-CM | POA: Diagnosis not present

## 2023-05-16 DIAGNOSIS — R195 Other fecal abnormalities: Secondary | ICD-10-CM | POA: Diagnosis not present

## 2023-05-21 ENCOUNTER — Other Ambulatory Visit: Payer: Self-pay | Admitting: Physician Assistant

## 2023-05-21 DIAGNOSIS — I10 Essential (primary) hypertension: Secondary | ICD-10-CM

## 2023-05-21 MED ORDER — LISINOPRIL 40 MG PO TABS: 40.0000 mg | ORAL_TABLET | Freq: Every day | ORAL | 0 refills | Status: DC

## 2023-05-21 NOTE — Telephone Encounter (Signed)
She she wanting me to write in for take 2 tablets?

## 2023-05-27 ENCOUNTER — Encounter: Payer: Self-pay | Admitting: Physician Assistant

## 2023-05-27 ENCOUNTER — Other Ambulatory Visit: Payer: Self-pay | Admitting: Physician Assistant

## 2023-05-27 DIAGNOSIS — L853 Xerosis cutis: Secondary | ICD-10-CM

## 2023-05-30 DIAGNOSIS — R197 Diarrhea, unspecified: Secondary | ICD-10-CM | POA: Diagnosis not present

## 2023-06-02 ENCOUNTER — Other Ambulatory Visit: Payer: Self-pay | Admitting: Physician Assistant

## 2023-06-02 DIAGNOSIS — H401122 Primary open-angle glaucoma, left eye, moderate stage: Secondary | ICD-10-CM | POA: Diagnosis not present

## 2023-06-02 DIAGNOSIS — I10 Essential (primary) hypertension: Secondary | ICD-10-CM

## 2023-06-05 ENCOUNTER — Other Ambulatory Visit: Payer: Self-pay | Admitting: Physician Assistant

## 2023-06-05 DIAGNOSIS — G8929 Other chronic pain: Secondary | ICD-10-CM

## 2023-07-19 ENCOUNTER — Other Ambulatory Visit: Payer: Self-pay | Admitting: Physician Assistant

## 2023-07-19 DIAGNOSIS — E118 Type 2 diabetes mellitus with unspecified complications: Secondary | ICD-10-CM

## 2023-07-21 ENCOUNTER — Encounter: Payer: Self-pay | Admitting: Physician Assistant

## 2023-07-21 ENCOUNTER — Ambulatory Visit: Payer: Medicare HMO | Admitting: Physician Assistant

## 2023-07-21 VITALS — BP 148/68 | HR 60 | Ht 62.0 in | Wt 153.0 lb

## 2023-07-21 DIAGNOSIS — M51362 Other intervertebral disc degeneration, lumbar region with discogenic back pain and lower extremity pain: Secondary | ICD-10-CM | POA: Diagnosis not present

## 2023-07-21 DIAGNOSIS — Z7984 Long term (current) use of oral hypoglycemic drugs: Secondary | ICD-10-CM | POA: Diagnosis not present

## 2023-07-21 DIAGNOSIS — N1831 Chronic kidney disease, stage 3a: Secondary | ICD-10-CM

## 2023-07-21 DIAGNOSIS — G2581 Restless legs syndrome: Secondary | ICD-10-CM | POA: Diagnosis not present

## 2023-07-21 DIAGNOSIS — E1122 Type 2 diabetes mellitus with diabetic chronic kidney disease: Secondary | ICD-10-CM | POA: Diagnosis not present

## 2023-07-21 DIAGNOSIS — E785 Hyperlipidemia, unspecified: Secondary | ICD-10-CM

## 2023-07-21 DIAGNOSIS — I10 Essential (primary) hypertension: Secondary | ICD-10-CM | POA: Diagnosis not present

## 2023-07-21 LAB — POCT GLYCOSYLATED HEMOGLOBIN (HGB A1C): Hemoglobin A1C: 5.6 % (ref 4.0–5.6)

## 2023-07-21 MED ORDER — ROPINIROLE HCL 0.5 MG PO TABS: 0.5000 mg | ORAL_TABLET | Freq: Three times a day (TID) | ORAL | 1 refills | Status: DC

## 2023-07-21 NOTE — Patient Instructions (Signed)
Will make referral.  Goal BP under 130/80 but needs to be under 140/90

## 2023-07-22 ENCOUNTER — Encounter: Payer: Self-pay | Admitting: Physician Assistant

## 2023-07-22 MED ORDER — REPATHA 140 MG/ML ~~LOC~~ SOSY: 140.0000 mg | PREFILLED_SYRINGE | SUBCUTANEOUS | 1 refills | Status: DC

## 2023-07-23 ENCOUNTER — Encounter: Payer: Self-pay | Admitting: Physician Assistant

## 2023-07-23 ENCOUNTER — Other Ambulatory Visit: Payer: Self-pay | Admitting: Physician Assistant

## 2023-07-23 LAB — IRON,TIBC AND FERRITIN PANEL
Ferritin: 162 ng/mL — ABNORMAL HIGH (ref 15–150)
Iron Saturation: 33 % (ref 15–55)
Iron: 112 ug/dL (ref 27–139)
Total Iron Binding Capacity: 340 ug/dL (ref 250–450)
UIBC: 228 ug/dL (ref 118–369)

## 2023-07-23 LAB — CMP14+EGFR
ALT: 20 [IU]/L (ref 0–32)
AST: 18 [IU]/L (ref 0–40)
Albumin: 4.5 g/dL (ref 3.8–4.8)
Alkaline Phosphatase: 45 [IU]/L (ref 44–121)
BUN/Creatinine Ratio: 19 (ref 12–28)
BUN: 19 mg/dL (ref 8–27)
Bilirubin Total: 0.4 mg/dL (ref 0.0–1.2)
CO2: 22 mmol/L (ref 20–29)
Calcium: 9.8 mg/dL (ref 8.7–10.3)
Chloride: 93 mmol/L — ABNORMAL LOW (ref 96–106)
Creatinine, Ser: 1 mg/dL (ref 0.57–1.00)
Globulin, Total: 2.3 g/dL (ref 1.5–4.5)
Glucose: 105 mg/dL — ABNORMAL HIGH (ref 70–99)
Potassium: 4.7 mmol/L (ref 3.5–5.2)
Sodium: 129 mmol/L — ABNORMAL LOW (ref 134–144)
Total Protein: 6.8 g/dL (ref 6.0–8.5)
eGFR: 59 mL/min/{1.73_m2} — ABNORMAL LOW (ref >59)

## 2023-07-23 LAB — CBC WITH DIFFERENTIAL/PLATELET
Basophils Absolute: 0.1 10*3/uL (ref 0.0–0.2)
Basos: 1 %
EOS (ABSOLUTE): 0.3 10*3/uL (ref 0.0–0.4)
Eos: 6 %
Hematocrit: 32.5 % — ABNORMAL LOW (ref 34.0–46.6)
Hemoglobin: 11 g/dL — ABNORMAL LOW (ref 11.1–15.9)
Immature Grans (Abs): 0 10*3/uL (ref 0.0–0.1)
Immature Granulocytes: 0 %
Lymphocytes Absolute: 1.5 10*3/uL (ref 0.7–3.1)
Lymphs: 26 %
MCH: 33.1 pg — ABNORMAL HIGH (ref 26.6–33.0)
MCHC: 33.8 g/dL (ref 31.5–35.7)
MCV: 98 fL — ABNORMAL HIGH (ref 79–97)
Monocytes Absolute: 0.5 10*3/uL (ref 0.1–0.9)
Monocytes: 9 %
Neutrophils Absolute: 3.4 10*3/uL (ref 1.4–7.0)
Neutrophils: 58 %
Platelets: 307 10*3/uL (ref 150–450)
RBC: 3.32 x10E6/uL — ABNORMAL LOW (ref 3.77–5.28)
RDW: 12.4 % (ref 11.7–15.4)
WBC: 5.8 10*3/uL (ref 3.4–10.8)

## 2023-07-23 LAB — TSH+FREE T4
Free T4: 1.15 ng/dL (ref 0.82–1.77)
TSH: 2.13 u[IU]/mL (ref 0.450–4.500)

## 2023-07-23 LAB — LIPID PANEL
Chol/HDL Ratio: 2.9 {ratio} (ref 0.0–4.4)
Cholesterol, Total: 231 mg/dL — ABNORMAL HIGH (ref 100–199)
HDL: 79 mg/dL (ref >39)
LDL Chol Calc (NIH): 140 mg/dL — ABNORMAL HIGH (ref 0–99)
Triglycerides: 69 mg/dL (ref 0–149)
VLDL Cholesterol Cal: 12 mg/dL (ref 5–40)

## 2023-07-23 NOTE — Progress Notes (Signed)
Established Patient Office Visit  Subjective   Patient ID: Ann Griffith, female    DOB: [birth year]  Age: 72 y.o. MRN: [id number]  Chief Complaint  Patient presents with   Medical Management of Chronic Issues    Last A1c 5.8    HPI Pt is a 72 yo female who presents to the clinic for 3 month follow up.   She is not checking her sugars. Denies any hypoglycemic symptoms. She is compliant with metformin. She is very active.   She denies any CP, palpitations, headaches or vision changes.   She does want referral to physiatry unit at [facility] for her lumbar DDD and RLS.   .. Active Ambulatory Problems    Diagnosis Date Noted   Type 2 diabetes mellitus with complication, without long-term current use of insulin (HCC) 11/09/2007   SCHATZKI'S RING 11/09/2007   CHOLELITHIASIS, HX OF 11/09/2007   Diverticulosis 06/07/2013   Raynaud's syndrome 08/09/2013   BPPV (benign paroxysmal positional vertigo), right 11/02/2014   Microalbuminuria 11/02/2014   Hyperlipidemia LDL goal <70 11/03/2014   Seborrheic dermatitis 03/14/2015   Memory changes 05/05/2015   Insomnia 08/08/2015   Bilateral leg pain 08/08/2015   Shoulder bursitis 08/08/2015   Avitaminosis D 10/09/2012   Colon polyp 04/24/2011   RLS (restless legs syndrome) 03/29/2016   Chronic right shoulder pain 04/01/2016   Cataract cortical, senile, left 06/04/2016   Elevated serum creatinine 12/10/2016   Primary osteoarthritis of both knees 06/26/2017   Carpal tunnel syndrome, right 06/26/2017   Dry skin dermatitis 06/26/2017   Fear of flying 09/28/2017   Orthostatic hypotension 12/16/2017   Syncope and collapse 12/16/2017   Mitral regurgitation 12/19/2017   Tricuspid regurgitation 12/19/2017   Raynaud's phenomenon without gangrene 02/03/2018   Concussion 12/08/2017   Essential hypertension 12/10/2017   Combined hyperlipidemia associated with type 2 diabetes mellitus (HCC) 04/24/2011   Itching 04/27/2018   Chronic pain of both  ankles 04/27/2018   Bilateral carpal tunnel syndrome 04/27/2018   Statin intolerance 07/03/2018   Leg cramping 09/03/2019   Toe pain, right 12/01/2019   Degenerative disc disease, cervical 12/07/2019   Myalgia 03/14/2020   Iron deficiency anemia 06/13/2020   Facet arthritis, degenerative, lumbar spine 06/28/2020   Acute left-sided low back pain with left-sided sciatica 06/28/2020   DDD (degenerative disc disease), lumbar 12/12/2020   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 03/13/2021   Stage 3a chronic kidney disease (HCC) 03/14/2021   Squamous cell carcinoma in situ 03/19/2021   Gastroesophageal reflux disease without esophagitis 06/13/2021   Change in consistency of stool 06/13/2021   Left lumbar radiculitis 07/31/2021   Irritable bowel syndrome with diarrhea 12/24/2021   Mild cognitive impairment 01/14/2022   Elevated vitamin B12 level 02/13/2022   Decreased GFR 02/13/2022   Trouble in sleeping 06/26/2022   Type 2 diabetes mellitus with stage 3a chronic kidney disease, without long-term current use of insulin (HCC) 06/26/2022   Thyroid disorder screening 09/27/2022   It band syndrome, left 12/27/2022   Resolved Ambulatory Problems    Diagnosis Date Noted   Eustachian tube dysfunction 05/02/2014   Tennis elbow syndrome 03/29/2016   Right shoulder pain 03/29/2016   Tricompartment osteoarthritis of knees, bilateral 06/25/2017   Trigger finger, left ring finger 06/26/2017   Controlled type 2 diabetes mellitus without complication, without long-term current use of insulin (HCC) 06/26/2017   Trochanteric bursitis 06/27/2017   Flatulence 09/28/2017   Nausea 12/16/2017   Hematoma and contusion 12/16/2017   Paronychia of great  toe, left 02/07/2020   Ingrown toenail of right foot 03/14/2020   Osteoarthritis of patellofemoral joints, bilateral 03/14/2021   Past Medical History:  Diagnosis Date   DIABETES MELLITUS, TYPE II 11/09/2007   Hyperlipidemia      ROS See HPI.     Objective:     BP (!) 148/68   Pulse 60   Ht 5\' 2"  (1.575 m)   Wt 153 lb (69.4 kg)   LMP 08/12/2000   SpO2 100%   BMI 27.98 kg/m  BP Readings from Last 3 Encounters:  07/21/23 (!) 148/68  03/31/23 (!) 140/70  12/27/22 (!) 140/62   Wt Readings from Last 3 Encounters:  07/21/23 153 lb (69.4 kg)  03/31/23 154 lb (69.9 kg)  12/27/22 153 lb (69.4 kg)    ..    07/29/2023   12:18 PM 04/09/2023    8:22 AM 03/31/2023    3:17 PM 03/31/2023    8:14 AM 09/27/2022    8:20 AM  Depression screen PHQ 2/9  Decreased Interest 0 0 0 0 0  Down, Depressed, Hopeless 0 0 0 0 0  PHQ - 2 Score 0 0 0 0 0      Physical Exam Constitutional:      Appearance: Normal appearance.  HENT:     Head: Normocephalic.  Cardiovascular:     Rate and Rhythm: Normal rate and regular rhythm.  Pulmonary:     Effort: Pulmonary effort is normal.  Neurological:     General: No focal deficit present.     Mental Status: She is alert and oriented to person, place, and time.  Psychiatric:        Mood and Affect: Mood normal.     Lab Results  Component Value Date   HGBA1C 5.6 07/21/2023     The 10-year ASCVD risk score (Arnett DK, et al., 2019) is: 38.8%    Assessment & Plan:  [name] was seen today for medical management of chronic issues.  Diagnoses and all orders for this visit:  Type 2 diabetes mellitus with stage 3a chronic kidney disease, without long-term current use of insulin (HCC) -     POCT HgB A1C -     CMP14+EGFR -     TSH + free T4 -     Lipid panel -     CBC w/Diff/Platelet -     Fe+TIBC+Fer  Essential hypertension -     CMP14+EGFR  Hyperlipidemia LDL goal <70 -     Lipid panel  RLS (restless legs syndrome) -     rOPINIRole (REQUIP) 0.5 MG tablet; Take 1 tablet (0.5 mg total) by mouth 3 (three) times daily. -     CMP14+EGFR -     TSH + free T4 -     CBC w/Diff/Platelet -     Fe+TIBC+Fer -     Ambulatory referral to Physical Medicine Rehab  Degeneration of  intervertebral disc of lumbar region with discogenic back pain and lower extremity pain -     Ambulatory referral to Physical Medicine Rehab   A1c to goal Refilled medication BP not to goal Check BP at home and bring in log On repatha Foot and eye exam UTD Vaccines UTD   Return in about 3 months (around 10/19/2023).    Tandy Gaw, PA-C

## 2023-07-23 NOTE — Progress Notes (Signed)
Ann Griffith,   Kidney function fairly stable from where it was 1 year ago.  Sodium dropped some more. Your blood pressure medications can do this. We may need to cut lisinopril in half and consider adding another type of BP medication to control BP. Cut lisinopril in half and lets check sodium in 2 weeks.   Thyroid looks good.   Stop any oral iron. Iron stores and serum iron are good.   Are you taking any b12. Please add b12 testing for anemia and increased MCV.

## 2023-07-24 ENCOUNTER — Encounter: Payer: Self-pay | Admitting: Physician Assistant

## 2023-08-09 ENCOUNTER — Other Ambulatory Visit: Payer: Self-pay | Admitting: Physician Assistant

## 2023-08-09 DIAGNOSIS — L853 Xerosis cutis: Secondary | ICD-10-CM

## 2023-08-11 DIAGNOSIS — H401122 Primary open-angle glaucoma, left eye, moderate stage: Secondary | ICD-10-CM | POA: Diagnosis not present

## 2023-08-12 ENCOUNTER — Ambulatory Visit (INDEPENDENT_AMBULATORY_CARE_PROVIDER_SITE_OTHER): Payer: Medicare HMO | Admitting: Physician Assistant

## 2023-08-12 ENCOUNTER — Other Ambulatory Visit: Payer: Self-pay | Admitting: Physician Assistant

## 2023-08-12 VITALS — BP 159/68 | HR 71 | Resp 14 | Ht 62.0 in | Wt 153.0 lb

## 2023-08-12 DIAGNOSIS — Z789 Other specified health status: Secondary | ICD-10-CM | POA: Diagnosis not present

## 2023-08-12 DIAGNOSIS — E871 Hypo-osmolality and hyponatremia: Secondary | ICD-10-CM

## 2023-08-12 DIAGNOSIS — E785 Hyperlipidemia, unspecified: Secondary | ICD-10-CM | POA: Diagnosis not present

## 2023-08-12 DIAGNOSIS — D539 Nutritional anemia, unspecified: Secondary | ICD-10-CM | POA: Diagnosis not present

## 2023-08-12 DIAGNOSIS — N1831 Type 2 diabetes mellitus with diabetic chronic kidney disease: Secondary | ICD-10-CM

## 2023-08-12 DIAGNOSIS — R7989 Other specified abnormal findings of blood chemistry: Secondary | ICD-10-CM

## 2023-08-12 DIAGNOSIS — R718 Other abnormality of red blood cells: Secondary | ICD-10-CM | POA: Diagnosis not present

## 2023-08-12 MED ORDER — ROSUVASTATIN CALCIUM 20 MG PO TABS: 20.0000 mg | ORAL_TABLET | Freq: Every day | ORAL | 3 refills | Status: DC

## 2023-08-12 NOTE — Progress Notes (Signed)
 Established Patient Office Visit  Subjective   Patient ID: Ann Griffith, female    DOB: 1951-02-20  Age: 72 y.o. MRN: [id number]  Chief Complaint  Patient presents with   Medication Problem    HPI Pt presents to the clinic to follow up.   She is not going to continue repatha  due to cost. She would like to restart statin. She is aware of side effects she reported on last time she tried statin.   Would like to know if she could start magnesium again. She was taken off due to diarrhea. It seemed to help with RLS.   Patient Active Problem List   Diagnosis Date Noted   Macrocytic anemia 08/12/2023   Elevated ferritin 08/12/2023   Hyponatremia 08/12/2023   Elevated MCV 08/12/2023   It band syndrome, left 12/27/2022   Thyroid  disorder screening 09/27/2022   Trouble in sleeping 06/26/2022   Type 2 diabetes mellitus with stage 3a chronic kidney disease, without long-term current use of insulin (HCC) 06/26/2022   Elevated vitamin B12 level 02/13/2022   Decreased GFR 02/13/2022   Mild cognitive impairment 01/14/2022   Irritable bowel syndrome with diarrhea 12/24/2021   Left lumbar radiculitis 07/31/2021   Gastroesophageal reflux disease without esophagitis 06/13/2021   Change in consistency of stool 06/13/2021   Squamous cell carcinoma in situ 03/19/2021   Stage 3a chronic kidney disease (HCC) 03/14/2021   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 03/13/2021   DDD (degenerative disc disease), lumbar 12/12/2020   Facet arthritis, degenerative, lumbar spine 06/28/2020   Acute left-sided low back pain with left-sided sciatica 06/28/2020   Iron deficiency anemia 06/13/2020   Myalgia 03/14/2020   Degenerative disc disease, cervical 12/07/2019   Toe pain, right 12/01/2019   Leg cramping 09/03/2019   Statin intolerance 07/03/2018   Itching 04/27/2018   Chronic pain of both ankles 04/27/2018   Bilateral carpal tunnel syndrome 04/27/2018   Raynaud's phenomenon without gangrene 02/03/2018    Mitral regurgitation 12/19/2017   Tricuspid regurgitation 12/19/2017   Orthostatic hypotension 12/16/2017   Syncope and collapse 12/16/2017   Essential hypertension 12/10/2017   Concussion 12/08/2017   Fear of flying 09/28/2017   Primary osteoarthritis of both knees 06/26/2017   Carpal tunnel syndrome, right 06/26/2017   Dry skin dermatitis 06/26/2017   Elevated serum creatinine 12/10/2016   Cataract cortical, senile, left 06/04/2016   Chronic right shoulder pain 04/01/2016   RLS (restless legs syndrome) 03/29/2016   Insomnia 08/08/2015   Bilateral leg pain 08/08/2015   Shoulder bursitis 08/08/2015   Memory changes 05/05/2015   Seborrheic dermatitis 03/14/2015   Hyperlipidemia LDL goal <70 11/03/2014   BPPV (benign paroxysmal positional vertigo), right 11/02/2014   Microalbuminuria 11/02/2014   Raynaud's syndrome 08/09/2013   Diverticulosis 06/07/2013   Avitaminosis D 10/09/2012   Colon polyp 04/24/2011   Combined hyperlipidemia associated with type 2 diabetes mellitus (HCC) 04/24/2011   Type 2 diabetes mellitus with complication, without long-term current use of insulin (HCC) 11/09/2007   SCHATZKI'S RING 11/09/2007   CHOLELITHIASIS, HX OF 11/09/2007   Past Medical History:  Diagnosis Date   CHOLELITHIASIS, HX OF 11/09/2007   Qualifier: Diagnosis of  By: Claudene GUSS Railing     DIABETES MELLITUS, TYPE II 11/09/2007   Qualifier: Diagnosis of  By: Claudene GUSS Railing     Diverticulosis    Hyperlipidemia    Family History  Problem Relation Age of Onset   Diabetes Mother    Allergies  Allergen Reactions   Amoxicillin  Swelling   Gabapentin      dizzy   Atorvastatin  Other (See Comments)    Memory loss      ROS See HPI.    Objective:     BP (!) 159/68 (BP Location: Left Arm, Patient Position: Sitting)   Pulse 71   Resp 14   Ht 5' 2 (1.575 m)   Wt 153 lb (69.4 kg)   LMP 08/12/2000   SpO2 100%   BMI 27.98 kg/m  BP Readings from Last 3 Encounters:   08/12/23 (!) 159/68  07/21/23 (!) 148/68  03/31/23 (!) 140/70   Wt Readings from Last 3 Encounters:  08/12/23 153 lb (69.4 kg)  07/21/23 153 lb (69.4 kg)  03/31/23 154 lb (69.9 kg)      Physical Exam Constitutional:      Appearance: Normal appearance.  Cardiovascular:     Rate and Rhythm: Normal rate.  Pulmonary:     Effort: Pulmonary effort is normal.  Neurological:     General: No focal deficit present.     Mental Status: She is alert.  Psychiatric:        Mood and Affect: Mood normal.       The 10-year ASCVD risk score (Arnett DK, et al., 2019) is: 43.3%    Assessment & Plan:  SABRASABRASherrie was seen today for medication problem.  Diagnoses and all orders for this visit:  Elevated MCV -     Ferritin  Macrocytic anemia -     Ferritin -     B12 and Folate Panel -     CMP14+EGFR -     Osmolality -     Osmolality, urine -     Cortisol, free, Serum -     Cortisol, urine, free -     Sodium, urine, 24 hour; Future  Elevated ferritin -     Ferritin  Hyponatremia -     Ferritin -     B12 and Folate Panel -     CMP14+EGFR -     Osmolality -     Osmolality, urine -     Cortisol, free, Serum -     Cortisol, urine, free -     Sodium, urine, 24 hour; Future  Hyperlipidemia LDL goal <70 -     rosuvastatin  (CRESTOR ) 20 MG tablet; Take 1 tablet (20 mg total) by mouth daily.  Statin intolerance -     rosuvastatin  (CRESTOR ) 20 MG tablet; Take 1 tablet (20 mg total) by mouth daily.   Labs ordered to recheck abnormalities.  Crestor  to restart due to very high CV risk Start magnesium 1/2 tablet daily and see if diarrhea does not come back  Return for keep DM regular follow up.    Boomer Winders, PA-C

## 2023-08-12 NOTE — Patient Instructions (Addendum)
Stop repatha Start rosuvastatin at bedtime Start magnesium 1/2 tablet at bedtime Continue to monitor BP at home

## 2023-08-14 ENCOUNTER — Encounter: Payer: Self-pay | Admitting: Physician Assistant

## 2023-08-14 ENCOUNTER — Other Ambulatory Visit: Payer: Self-pay

## 2023-08-14 ENCOUNTER — Encounter: Payer: Self-pay | Admitting: Emergency Medicine

## 2023-08-14 DIAGNOSIS — E871 Hypo-osmolality and hyponatremia: Secondary | ICD-10-CM

## 2023-08-14 DIAGNOSIS — D539 Nutritional anemia, unspecified: Secondary | ICD-10-CM

## 2023-08-15 ENCOUNTER — Encounter: Payer: Self-pay | Admitting: Physician Assistant

## 2023-08-15 NOTE — Progress Notes (Signed)
 Netasha,   Iron stores improving, good news.  Kidney function fairly stable.   B12 too elevated. I told you to increase based on size of RBC. Cut dosing back to what you were taking.   Sodium improving some.  Still awaiting some labs to look better at reasons for sodium being low.

## 2023-08-18 DIAGNOSIS — E871 Hypo-osmolality and hyponatremia: Secondary | ICD-10-CM | POA: Diagnosis not present

## 2023-08-18 DIAGNOSIS — D539 Nutritional anemia, unspecified: Secondary | ICD-10-CM | POA: Diagnosis not present

## 2023-08-19 ENCOUNTER — Encounter: Payer: Self-pay | Admitting: Physician Assistant

## 2023-08-21 LAB — SODIUM, URINE, 24 HOUR: Sodium, Ur: 106 mmol/L

## 2023-08-21 LAB — SPECIMEN STATUS REPORT

## 2023-08-22 ENCOUNTER — Encounter: Payer: Self-pay | Admitting: Physical Medicine & Rehabilitation

## 2023-08-26 LAB — CMP14+EGFR
ALT: 17 [IU]/L (ref 0–32)
AST: 19 [IU]/L (ref 0–40)
Albumin: 4.4 g/dL (ref 3.8–4.8)
Alkaline Phosphatase: 42 [IU]/L — ABNORMAL LOW (ref 44–121)
BUN/Creatinine Ratio: 18 (ref 12–28)
BUN: 19 mg/dL (ref 8–27)
Bilirubin Total: 0.3 mg/dL (ref 0.0–1.2)
CO2: 21 mmol/L (ref 20–29)
Calcium: 9.6 mg/dL (ref 8.7–10.3)
Chloride: 96 mmol/L (ref 96–106)
Creatinine, Ser: 1.05 mg/dL — ABNORMAL HIGH (ref 0.57–1.00)
Globulin, Total: 2.3 g/dL (ref 1.5–4.5)
Glucose: 102 mg/dL — ABNORMAL HIGH (ref 70–99)
Potassium: 4.8 mmol/L (ref 3.5–5.2)
Sodium: 130 mmol/L — ABNORMAL LOW (ref 134–144)
Total Protein: 6.7 g/dL (ref 6.0–8.5)
eGFR: 56 mL/min/{1.73_m2} — ABNORMAL LOW (ref >59)

## 2023-08-26 LAB — OSMOLALITY, URINE

## 2023-08-26 LAB — FERRITIN: Ferritin: 128 ng/mL (ref 15–150)

## 2023-08-26 LAB — B12 AND FOLATE PANEL
Folate: 10.1 ng/mL (ref >3.0)
Vitamin B-12: >2000 pg/mL — ABNORMAL HIGH (ref 232–1245)

## 2023-08-26 LAB — CORTISOL, FREE: Cortisol, Free Dialysis, LCMS: 0.635 ug/dL

## 2023-08-26 LAB — OSMOLALITY: Osmolality Meas: 268 mosm/kg — ABNORMAL LOW (ref 280–301)

## 2023-08-29 NOTE — Progress Notes (Signed)
Sodium in urine normal.

## 2023-08-30 ENCOUNTER — Other Ambulatory Visit: Payer: Self-pay | Admitting: Physician Assistant

## 2023-08-30 DIAGNOSIS — K219 Gastro-esophageal reflux disease without esophagitis: Secondary | ICD-10-CM

## 2023-09-08 DIAGNOSIS — H401422 Capsular glaucoma with pseudoexfoliation of lens, left eye, moderate stage: Secondary | ICD-10-CM | POA: Diagnosis not present

## 2023-09-10 DIAGNOSIS — Z008 Encounter for other general examination: Secondary | ICD-10-CM | POA: Diagnosis not present

## 2023-09-30 ENCOUNTER — Other Ambulatory Visit: Payer: Self-pay | Admitting: Physician Assistant

## 2023-09-30 DIAGNOSIS — L853 Xerosis cutis: Secondary | ICD-10-CM

## 2023-10-27 DIAGNOSIS — H401422 Capsular glaucoma with pseudoexfoliation of lens, left eye, moderate stage: Secondary | ICD-10-CM | POA: Diagnosis not present

## 2023-10-28 ENCOUNTER — Encounter: Payer: Self-pay | Admitting: Physician Assistant

## 2023-10-28 ENCOUNTER — Ambulatory Visit (INDEPENDENT_AMBULATORY_CARE_PROVIDER_SITE_OTHER): Payer: Medicare HMO | Admitting: Physician Assistant

## 2023-10-28 VITALS — BP 117/71 | HR 69 | Ht 62.0 in | Wt 149.2 lb

## 2023-10-28 DIAGNOSIS — G2581 Restless legs syndrome: Secondary | ICD-10-CM

## 2023-10-28 DIAGNOSIS — E785 Hyperlipidemia, unspecified: Secondary | ICD-10-CM | POA: Diagnosis not present

## 2023-10-28 DIAGNOSIS — Z2911 Encounter for prophylactic immunotherapy for respiratory syncytial virus (RSV): Secondary | ICD-10-CM | POA: Diagnosis not present

## 2023-10-28 DIAGNOSIS — Z7984 Long term (current) use of oral hypoglycemic drugs: Secondary | ICD-10-CM

## 2023-10-28 DIAGNOSIS — N1831 Chronic kidney disease, stage 3a: Secondary | ICD-10-CM | POA: Diagnosis not present

## 2023-10-28 DIAGNOSIS — I10 Essential (primary) hypertension: Secondary | ICD-10-CM | POA: Diagnosis not present

## 2023-10-28 DIAGNOSIS — E1122 Type 2 diabetes mellitus with diabetic chronic kidney disease: Secondary | ICD-10-CM

## 2023-10-28 LAB — POCT GLYCOSYLATED HEMOGLOBIN (HGB A1C): Hemoglobin A1C: 5.6 % (ref 4.0–5.6)

## 2023-10-28 MED ORDER — METFORMIN HCL 1000 MG PO TABS: ORAL_TABLET | ORAL | Status: DC

## 2023-10-28 MED ORDER — ROPINIROLE HCL 0.5 MG PO TABS: ORAL_TABLET | ORAL | Status: DC

## 2023-10-28 NOTE — Progress Notes (Unsigned)
 Established Patient Office Visit  Subjective   Patient ID: Ann Griffith, female    DOB: 22-Feb-1951  Age: 73 y.o. MRN: [id number]  Chief Complaint  Patient presents with   Medical Management of Chronic Issues    T2DM last A1C 5.6    HPI Patient is a 73 year old female with type 2 diabetes, hypertension, hyperlipidemia, restless leg, CKD 3 who presents to the clinic for follow-up and medication refills.  Patient takes metformin 1000 mg daily.  She does not check her sugars regularly.  She is very active.  She has not done well with her diet over the last 3 months.  She has been on a cruise and multiple trips.  She denies any chest pain, palpitations, headaches, vision changes.  She denies any hypoglycemic events.  Not sure of what is causing her leg cramps and pain at night and at times even during the day.  Reports her legs will get extremely tight in the posterior calf and cause her to wake up.  She does take 0.5 mg of Requip at bedtime and sometimes during the day.    ROS See HPI.    Objective:     BP 117/71 (BP Location: Left Arm, Patient Position: Sitting, Cuff Size: Normal)   Pulse 69   Ht 5\' 2"  (1.575 m)   Wt 149 lb 4 oz (67.7 kg)   LMP 08/12/2000   SpO2 93%   BMI 27.30 kg/m  BP Readings from Last 3 Encounters:  10/28/23 117/71  08/12/23 (!) 159/68  07/21/23 (!) 148/68   Wt Readings from Last 3 Encounters:  10/28/23 149 lb 4 oz (67.7 kg)  08/12/23 153 lb (69.4 kg)  07/21/23 153 lb (69.4 kg)    .[name] Results for orders placed or performed in visit on 10/28/23  POCT HgB A1C   Collection Time: 10/28/23  8:25 AM  Result Value Ref Range   Hemoglobin A1C 5.6 4.0 - 5.6 %   HbA1c POC (<> result, manual entry)     HbA1c, POC (prediabetic range)     HbA1c, POC (controlled diabetic range)       Physical Exam Constitutional:      Appearance: Normal appearance.  HENT:     Head: Normocephalic.  Cardiovascular:     Rate and Rhythm: Normal rate and regular  rhythm.  Pulmonary:     Effort: Pulmonary effort is normal.     Breath sounds: Normal breath sounds.  Musculoskeletal:     Right lower leg: No edema.     Left lower leg: No edema.  Neurological:     Mental Status: She is alert and oriented to person, place, and time.  Psychiatric:        Mood and Affect: Mood normal.        The 10-year ASCVD risk score (Arnett DK, et al., 2019) is: 26.3%    Assessment & Plan:  [name] was seen today for medical management of chronic issues.  Diagnoses and all orders for this visit:  Type 2 diabetes mellitus with stage 3a chronic kidney disease, without long-term current use of insulin (HCC) -     POCT HgB A1C -     metFORMIN (GLUCOPHAGE) 1000 MG tablet; TAKE 1/2 TABLET (500 MG TOTAL) BY MOUTH TWICE A DAY WITH FOOD  RLS (restless legs syndrome) -     rOPINIRole (REQUIP) 0.5 MG tablet; Take one tablet morning and afternoon and 2 tablets at bedtime.  Need for RSV immunization -  RSV,Recombinant PF (Arexvy)  Hyperlipidemia LDL goal <70  Essential hypertension   A1C looks GREAT!  Decrease metofmrin to 500mg  once a day in hopes of getting off medication at some point BP to goal On statin Needs eye exam UTD foot exam Vaccines UTD Follow up in 3 months RSV given in office today  I think her leg cramps are RLS symptoms seem to happen more a night. Increase requip to 2 tablets at bedtime and see if that helps.       Return in about 3 months (around 01/28/2024).    Tandy Gaw, PA-C

## 2023-10-28 NOTE — Patient Instructions (Signed)
 Decrease metformin to 500mg  once daily.  Increase requip to 2 tablets at bedtime.

## 2023-10-29 ENCOUNTER — Encounter: Payer: Self-pay | Admitting: Physician Assistant

## 2023-10-29 DIAGNOSIS — Z2911 Encounter for prophylactic immunotherapy for respiratory syncytial virus (RSV): Secondary | ICD-10-CM | POA: Insufficient documentation

## 2023-10-30 ENCOUNTER — Encounter: Payer: Self-pay | Admitting: Physician Assistant

## 2023-10-31 ENCOUNTER — Encounter: Payer: Medicare HMO | Attending: Physical Medicine & Rehabilitation | Admitting: Physical Medicine & Rehabilitation

## 2023-10-31 ENCOUNTER — Encounter: Payer: Self-pay | Admitting: Physical Medicine & Rehabilitation

## 2023-10-31 VITALS — BP 136/77 | HR 77 | Ht 62.0 in | Wt 149.6 lb

## 2023-10-31 DIAGNOSIS — M47816 Spondylosis without myelopathy or radiculopathy, lumbar region: Secondary | ICD-10-CM | POA: Insufficient documentation

## 2023-10-31 NOTE — Progress Notes (Signed)
 Subjective:    Patient ID: Ann Griffith, female    DOB: July 28, 1951, 73 y.o.   MRN: [id number]  HPI Patient referred by primary care with complaints of bilateral leg pain at night.  Chart review indicates that patient has had some similar symptoms since at least 04/06/2019 which she underwent bilateral knee films pelvic films and lumbar x-rays.  The x-rays showed minimal loss of joint space at medial compartment, pelvic films were essentially unremarkable other than showing some L5-S1 facet osteoarthritis which was confirmed on lumbar x-rays. Patient has a history of diabetes but control has been very good over the last year, hemoglobin A1c has been less than 6.  Pt feels a pinch in the but, did not have any pain below the knee No numbness or tingling in feet Pt has had more butt pinching when walking rather than during sitting  Has bilateral lateral thigh pain  Pain behind the knees occurs a couple times a week, this can occur a any time, No activities that seem to flare this up  Has not tried PT for this Also feels balance has been off for 2 years but pain behind the knees for < 1 year  Pain Inventory Average Pain 8 Pain Right Now 5 My pain is intermittent, sharp, and burning  In the last 24 hours, has pain interfered with the following? General activity  varies Relation with others 0 Enjoyment of life 0 What TIME of day is your pain at its worst? night Sleep (in general) Fair  Pain is worse with: sitting Pain improves with: therapy/exercise Relief from Meds:  na  walk without assistance ability to climb steps?  yes do you drive?  yes  retired  trouble walking spasms  Any changes since last visit?  no  Any changes since last visit?  no    Family History  Problem Relation Age of Onset   Diabetes Mother    Social History   Socioeconomic History   Marital status: Married    Spouse name: Jillyn Hidden   Number of children: 1   Years of education: 12   Highest  education level: High school graduate  Occupational History   Occupation: data entry Solicitor in receiving    Comment: Retired  Tobacco Use   Smoking status: Never   Smokeless tobacco: Never  Vaping Use   Vaping status: Never Used  Substance and Sexual Activity   Alcohol use: Yes    Alcohol/week: 0.0 - 1.0 standard drinks of alcohol    Comment: occasionally   Drug use: No   Sexual activity: Yes    Partners: Male    Birth control/protection: Post-menopausal  Other Topics Concern   Not on file  Social History Narrative   Lives with her husband. Likes to go to movies and travel.    Social Drivers of Corporate investment banker Strain: Low Risk  (04/09/2023)   Overall Financial Resource Strain (CARDIA)    Difficulty of Paying Living Expenses: Not hard at all  Food Insecurity: No Food Insecurity (04/09/2023)   Hunger Vital Sign    Worried About Running Out of Food in the Last Year: Never true    Ran Out of Food in the Last Year: Never true  Transportation Needs: No Transportation Needs (04/09/2023)   PRAPARE - Administrator, Civil Service (Medical): No    Lack of Transportation (Non-Medical): No  Physical Activity: Sufficiently Active (04/09/2023)   Exercise Vital Sign    Days of Exercise  per Week: 3 days    Minutes of Exercise per Session: 60 min  Stress: No Stress Concern Present (04/09/2023)   [employer] of Occupational Health - Occupational Stress Questionnaire    Feeling of Stress : Not at all  Social Connections: Moderately Isolated (04/09/2023)   Social Connection and Isolation Panel [NHANES]    Frequency of Communication with Friends and Family: Once a week    Frequency of Social Gatherings with Friends and Family: More than three times a week    Attends Religious Services: Never    Database administrator or Organizations: No    Attends Banker Meetings: Never    Marital Status: Married   Past Surgical History:  Procedure Laterality Date    bone spur removed  2003   left heel   CATARACT EXTRACTION Left 02/2016   CHOLECYSTECTOMY     COLONOSCOPY  08/2003   Past Medical History:  Diagnosis Date   CHOLELITHIASIS, HX OF 11/09/2007   Qualifier: Diagnosis of  By: Alesia Richards     DIABETES MELLITUS, TYPE II 11/09/2007   Qualifier: Diagnosis of  By: Alesia Richards     Diverticulosis    Hyperlipidemia    BP 136/77   Pulse 77   Ht 5\' 2"  (1.575 m)   Wt 149 lb 9.6 oz (67.9 kg)   LMP 08/12/2000   SpO2 95%   BMI 27.36 kg/m   Opioid Risk Score:   Fall Risk Score:  `1  Depression screen PHQ 2/9     10/31/2023    2:10 PM 07/29/2023   12:18 PM 04/09/2023    8:22 AM 03/31/2023    3:17 PM 03/31/2023    8:14 AM 09/27/2022    8:20 AM 06/26/2022    7:54 AM  Depression screen PHQ 2/9  Decreased Interest 0 0 0 0 0 0 0  Down, Depressed, Hopeless 0 0 0 0 0 0 0  PHQ - 2 Score 0 0 0 0 0 0 0  Altered sleeping 1        Tired, decreased energy 1        Change in appetite 0        Feeling bad or failure about yourself  0        Trouble concentrating 1        Suicidal thoughts 0        PHQ-9 Score 3           Review of Systems  Musculoskeletal:        Muscle cramps and restless legs  All other systems reviewed and are negative.      Objective:   Physical Exam General No acute distress Mood and affect appropriate Extremities without edema Motor strength is 5/5 bilateral hip flexor knee extensor ankle dorsiflexor She has normal range of motion hips knees ankles.  Sacral thrust (prone) : Positive right Lateral compression: Positive left greater trochanter FABER's: Negative Distraction (supine): Negative Thigh thrust test: Negative  Lumbar spine has normal range of motion flexion extension lateral bending and rotation Right lateral bending does exacerbate her pain in her lumbosacral junction.  There is no tenderness over the thoracic or lumbar area no evidence of scoliosis. Ambulates without assistive device no  evidence of toe drag or knee instability There is no tenderness palpation over the gluteus medius area of glue maximus area or piriformis area    Assessment & Plan:  1.  Chronic pinching pain in the right buttock,  she has increased pain with right lateral bending as well as x-ray findings from 5 years ago demonstrating L5-S1 facet arthropathy.  We discussed that her current pain symptoms likely reflect the same issue with additional possibility of foraminal stenosis on the right side. We discussed trial of physical therapy which would be a good first step. I will see her back in 4 to 6 weeks and if no better order MRI lumbar spine.

## 2023-11-04 NOTE — Addendum Note (Signed)
 Addended by: Oval Linsey on: 11/04/2023 09:38 AM   Modules accepted: Orders

## 2023-11-11 DIAGNOSIS — D225 Melanocytic nevi of trunk: Secondary | ICD-10-CM | POA: Diagnosis not present

## 2023-11-11 DIAGNOSIS — L578 Other skin changes due to chronic exposure to nonionizing radiation: Secondary | ICD-10-CM | POA: Diagnosis not present

## 2023-11-11 DIAGNOSIS — L814 Other melanin hyperpigmentation: Secondary | ICD-10-CM | POA: Diagnosis not present

## 2023-11-11 DIAGNOSIS — L821 Other seborrheic keratosis: Secondary | ICD-10-CM | POA: Diagnosis not present

## 2023-11-11 DIAGNOSIS — Z85828 Personal history of other malignant neoplasm of skin: Secondary | ICD-10-CM | POA: Diagnosis not present

## 2023-11-17 ENCOUNTER — Telehealth: Payer: Self-pay | Admitting: Physical Medicine & Rehabilitation

## 2023-11-17 DIAGNOSIS — M47816 Spondylosis without myelopathy or radiculopathy, lumbar region: Secondary | ICD-10-CM

## 2023-11-17 NOTE — Telephone Encounter (Signed)
 Patient called in checking on referral to PT , patient states she was told to start PT and would like to go to the [place] location

## 2023-11-18 ENCOUNTER — Encounter: Payer: Self-pay | Admitting: *Deleted

## 2023-11-18 DIAGNOSIS — Z961 Presence of intraocular lens: Secondary | ICD-10-CM | POA: Diagnosis not present

## 2023-11-18 DIAGNOSIS — H2511 Age-related nuclear cataract, right eye: Secondary | ICD-10-CM | POA: Diagnosis not present

## 2023-11-18 DIAGNOSIS — E119 Type 2 diabetes mellitus without complications: Secondary | ICD-10-CM | POA: Diagnosis not present

## 2023-11-18 DIAGNOSIS — H04123 Dry eye syndrome of bilateral lacrimal glands: Secondary | ICD-10-CM | POA: Diagnosis not present

## 2023-11-18 DIAGNOSIS — H43813 Vitreous degeneration, bilateral: Secondary | ICD-10-CM | POA: Diagnosis not present

## 2023-11-18 DIAGNOSIS — Z135 Encounter for screening for eye and ear disorders: Secondary | ICD-10-CM | POA: Diagnosis not present

## 2023-11-18 DIAGNOSIS — H524 Presbyopia: Secondary | ICD-10-CM | POA: Diagnosis not present

## 2023-11-25 ENCOUNTER — Other Ambulatory Visit: Payer: Self-pay | Admitting: Physician Assistant

## 2023-11-25 DIAGNOSIS — G8929 Other chronic pain: Secondary | ICD-10-CM

## 2023-11-25 NOTE — Therapy (Signed)
 OUTPATIENT PHYSICAL THERAPY THORACOLUMBAR EVALUATION   Patient Name: Ann Griffith MRN: [id number] DOB:[birth year], 73 y.o., female Today's Date: 11/26/2023  END OF SESSION:  PT End of Session - 11/26/23 1402     Visit Number 1    Date for PT Re-Evaluation 01/21/24    Authorization Type aetna mcr    Progress Note Due on Visit 10    PT Start Time 1403    PT Stop Time 1445    PT Time Calculation (min) 42 min    Activity Tolerance Patient tolerated treatment well    Behavior During Therapy WFL for tasks assessed/performed             Past Medical History:  Diagnosis Date   CHOLELITHIASIS, HX OF 11/09/2007   Qualifier: Diagnosis of  By: Alesia Richards     DIABETES MELLITUS, TYPE II 11/09/2007   Qualifier: Diagnosis of  By: Alesia Richards     Diverticulosis    Hyperlipidemia    Past Surgical History:  Procedure Laterality Date   bone spur removed  2003   left heel   CATARACT EXTRACTION Left 02/2016   CHOLECYSTECTOMY     COLONOSCOPY  08/2003   Patient Active Problem List   Diagnosis Date Noted   Need for RSV immunization 10/29/2023   Macrocytic anemia 08/12/2023   Elevated ferritin 08/12/2023   Hyponatremia 08/12/2023   Elevated MCV 08/12/2023   It band syndrome, left 12/27/2022   Thyroid disorder screening 09/27/2022   Trouble in sleeping 06/26/2022   Type 2 diabetes mellitus with stage 3a chronic kidney disease, without long-term current use of insulin (HCC) 06/26/2022   Elevated vitamin B12 level 02/13/2022   Decreased GFR 02/13/2022   Mild cognitive impairment 01/14/2022   Irritable bowel syndrome with diarrhea 12/24/2021   Left lumbar radiculitis 07/31/2021   Gastroesophageal reflux disease without esophagitis 06/13/2021   Change in consistency of stool 06/13/2021   Squamous cell carcinoma in situ 03/19/2021   Stage 3a chronic kidney disease (HCC) 03/14/2021   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 03/13/2021   DDD (degenerative disc disease),  lumbar 12/12/2020   Facet arthritis, degenerative, lumbar spine 06/28/2020   Acute left-sided low back pain with left-sided sciatica 06/28/2020   Iron deficiency anemia 06/13/2020   Myalgia 03/14/2020   Degenerative disc disease, cervical 12/07/2019   Toe pain, right 12/01/2019   Leg cramping 09/03/2019   Statin intolerance 07/03/2018   Itching 04/27/2018   Chronic pain of both ankles 04/27/2018   Bilateral carpal tunnel syndrome 04/27/2018   Raynaud's phenomenon without gangrene 02/03/2018   Mitral regurgitation 12/19/2017   Tricuspid regurgitation 12/19/2017   Orthostatic hypotension 12/16/2017   Syncope and collapse 12/16/2017   Essential hypertension 12/10/2017   Concussion 12/08/2017   Fear of flying 09/28/2017   Primary osteoarthritis of both knees 06/26/2017   Carpal tunnel syndrome, right 06/26/2017   Dry skin dermatitis 06/26/2017   Elevated serum creatinine 12/10/2016   Cataract cortical, senile, left 06/04/2016   Chronic right shoulder pain 04/01/2016   RLS (restless legs syndrome) 03/29/2016   Insomnia 08/08/2015   Bilateral leg pain 08/08/2015   Shoulder bursitis 08/08/2015   Memory changes 05/05/2015   Seborrheic dermatitis 03/14/2015   Hyperlipidemia LDL goal <70 11/03/2014   BPPV (benign paroxysmal positional vertigo), right 11/02/2014   Microalbuminuria 11/02/2014   Raynaud's syndrome 08/09/2013   Diverticulosis 06/07/2013   Avitaminosis D 10/09/2012   Colon polyp 04/24/2011   Combined hyperlipidemia associated with type 2 diabetes mellitus (  HCC) 04/24/2011   Type 2 diabetes mellitus with complication, without long-term current use of insulin (HCC) 11/09/2007   SCHATZKI'S RING 11/09/2007   CHOLELITHIASIS, HX OF 11/09/2007    PCP: Jomarie Longs, PA-C   REFERRING PROVIDER: Erick Colace, MD   REFERRING DIAG: [id number] (ICD-10-CM) - Spondylosis without myelopathy or radiculopathy, lumbar region   Rationale for Evaluation and Treatment:  Rehabilitation  THERAPY DIAG:  Other low back pain  Pain in right hip  Unsteadiness on feet  Cramp and spasm  ONSET DATE: 2 years  SUBJECTIVE:                                                                                                                                                                                           SUBJECTIVE STATEMENT: I have a pinch all the time in my butt and my balance is off. I had sciatica once really bad and now it's just there. Mostly on R side, sometimes on L. Sleeps on back with pillow under knees. Otherwise gets pain into her thighs. Yesterday I was weeding and it started bothering me. Have lost my balance getting up from flower bed and almost fallen. And I seem to go to the left all the time when walking. Has LBP with mopping floors and the like.  PERTINENT HISTORY:  DM, OA in knees/back  PAIN:  Are you having pain? Yes: NPRS scale: 7 Pain location: B gluteals R>L Pain description: pinch Aggravating factors: in the morning Relieving factors: nothing  PRECAUTIONS: None  RED FLAGS: None   WEIGHT BEARING RESTRICTIONS: No  FALLS:  Has patient fallen in last 6 months? No  LIVING ENVIRONMENT: Lives with: lives with their spouse Lives in: House/apartment Stairs:  3 porch steps - no issues Has following equipment at home: Single point cane  OCCUPATION: retired  PLOF: Independent and Leisure: yard work, travels a lot  PATIENT GOALS: stop my butt form hurting, improve my balance  NEXT MD VISIT: end of April  OBJECTIVE:  Note: Objective measures were completed at Evaluation unless otherwise noted.  DIAGNOSTIC FINDINGS:  none  PATIENT SURVEYS:  Modified Oswestry 10 / 50 = 20.0 %   COGNITION: Overall cognitive status: Within functional limits for tasks assessed     SENSATION: WFL  MUSCLE LENGTH: R piriformis, R hip flexor quad, R HS  POSTURE: forward head and decreased lumbar lordosis  PALPATION: Pain with UPA lumbar  mobs L, tightness with UPA mobs R some pain in L1/2 B  LUMBAR ROM:   AROM eval  Flexion WFL  Extension WFL  Right lateral flexion Full  Left lateral flexion Full *  in R buttock  Right rotation 75%  Left rotation 75%   (Blank rows = not tested)  LOWER EXTREMITY ROM:   WNL  LOWER EXTREMITY MMT:    MMT Right eval Left eval  Hip flexion 4+ 5  Hip extension 5 5  Hip abduction 5 5  Hip adduction 5 5  Hip internal rotation    Hip external rotation    Knee flexion 4- 5  Knee extension 5 5  Ankle dorsiflexion 5 5  Ankle plantarflexion    Ankle inversion    Ankle eversion     (Blank rows = not tested)  LUMBAR SPECIAL TESTS:  Quadrant test: Positive and FABER test: Negative  FUNCTIONAL TESTS:  5 times sit to stand: 13.15 sec SLS < 2 sec B Stairs eccentric quad weakness Romberg 30 sec EO and EC   TREATMENT DATE:                                                                                                                               11/26/23 See pt ed and HEP    PATIENT EDUCATION:  Education details: PT eval findings, anticipated POC, initial HEP, and role of DN  Person educated: Patient Education method: Explanation, Demonstration, and Handouts Education comprehension: verbalized understanding and returned demonstration  HOME EXERCISE PROGRAM: Access Code: [email or web address] URL: [email or web address] Date: 11/26/2023 Prepared by: Concha Deed  Exercises - Supine Hamstring Stretch with Strap  - 2 x daily - 7 x weekly - 2 sets - 3 reps - 30 sec hold - Supine Quadriceps Stretch with Strap on Table  - 2 x daily - 7 x weekly - 1 sets - 3 reps - 30 sec hold - Seated Piriformis Stretch with Trunk Bend  - 2 x daily - 7 x weekly - 1 sets - 3 reps - 30-60 sec  hold       ASSESSMENT:  CLINICAL IMPRESSION: Patient is a 73 y.o. female who was seen today for physical therapy evaluation and treatment for lumbar spondylosis. She has pain in primarily her R buttock, but  sometimes left also. Her pain is worse with sleeping and flexion activities such as vacuuming and weeding. She has mild strength deficits with MMT, but demonstrates functional weakness with SLS activities and stairs. She is unable to stand on either leg independently for more than 2 sec  and she complains of balance problems when walking and with ADLS. She will benefit from skilled PT to address these deficits.    OBJECTIVE IMPAIRMENTS: Abnormal gait, decreased activity tolerance, decreased balance, decreased ROM, decreased strength, hypomobility, increased muscle spasms, impaired flexibility, postural dysfunction, and pain.   ACTIVITY LIMITATIONS: bending, squatting, sleeping, and stairs  PARTICIPATION LIMITATIONS: cleaning, community activity, and yard work  PERSONAL FACTORS: Age, Past/current experiences, Time since onset of injury/illness/exacerbation, and 1-2 comorbidities: LBP, DM  are also affecting patient's functional outcome.   REHAB POTENTIAL: Excellent  CLINICAL DECISION MAKING: Evolving/moderate complexity  EVALUATION COMPLEXITY:  Moderate   GOALS: Goals reviewed with patient? Yes  SHORT TERM GOALS: Target date: 12/24/2023   Patient will be independent with initial HEP.  Baseline:  Goal status: INITIAL  2.  Patient will report decreased buttock pain by 25% with ADLS and sleeping.  Baseline:  Goal status: INITIAL  3.  Balance assessment completed and goals set at 2nd visit. Baseline:  Goal status: INITIAL   LONG TERM GOALS: Target date: 01/21/2024   Patient will be independent with advanced/ongoing HEP to improve outcomes and carryover.  Baseline:  Goal status: INITIAL  2.  Patient will report >= 80% improvement in buttock pain with ADLs to improve QOL.  Baseline:  Goal status: INITIAL  3.  Patient able to stand from squatting position without LOB to improve safety with gardening.   Baseline:  Goal status: INITIAL  4.  Patient will report improvement of pain  with sleeping by 50% or more. Baseline:  Goal status: INITIAL  5.  Patient will improve 8 points on the BERG to decrease fall risk.  Baseline: TBD Goal status: INITIAL  6.  Patient to score > 19 on DGI to decrease fall risk . Baseline: TBD Goal status: INITIAL    PLAN:  PT FREQUENCY: 2x/week  PT DURATION: 8 weeks  PLANNED INTERVENTIONS: 97164- PT Re-evaluation, 97110-Therapeutic exercises, 97530- Therapeutic activity, 97112- Neuromuscular re-education, 97535- Self Care, 16109- Manual therapy, [phone]- Gait training, [phone]- Electrical stimulation (unattended), [phone]- Traction (mechanical), Patient/Family education, Balance training, Stair training, Taping, Dry Needling, Joint mobilization, Spinal mobilization, Cryotherapy, and Moist heat.  PLAN FOR NEXT SESSION:  Do BERG and DGI and complete baselines for goals, focus on core and functional hip strength, eccentric quad strength, squat to stand    [employer], PT  11/26/2023, 3:09 PM

## 2023-11-26 ENCOUNTER — Ambulatory Visit: Attending: Physical Medicine & Rehabilitation | Admitting: Physical Therapy

## 2023-11-26 ENCOUNTER — Other Ambulatory Visit: Payer: Self-pay

## 2023-11-26 ENCOUNTER — Encounter: Payer: Self-pay | Admitting: Physical Therapy

## 2023-11-26 DIAGNOSIS — M5459 Other low back pain: Secondary | ICD-10-CM | POA: Diagnosis not present

## 2023-11-26 DIAGNOSIS — M25551 Pain in right hip: Secondary | ICD-10-CM | POA: Diagnosis not present

## 2023-11-26 DIAGNOSIS — R252 Cramp and spasm: Secondary | ICD-10-CM | POA: Diagnosis not present

## 2023-11-26 DIAGNOSIS — R2681 Unsteadiness on feet: Secondary | ICD-10-CM | POA: Insufficient documentation

## 2023-11-26 DIAGNOSIS — M47816 Spondylosis without myelopathy or radiculopathy, lumbar region: Secondary | ICD-10-CM | POA: Diagnosis not present

## 2023-11-27 ENCOUNTER — Other Ambulatory Visit: Payer: Self-pay | Admitting: Physician Assistant

## 2023-11-27 DIAGNOSIS — G2581 Restless legs syndrome: Secondary | ICD-10-CM

## 2023-11-27 NOTE — Telephone Encounter (Signed)
 Is the rx refill appropriate. The SIG/directions on the refill request is different. Thanks in advance.

## 2023-12-02 ENCOUNTER — Ambulatory Visit: Admitting: Physical Therapy

## 2023-12-03 DIAGNOSIS — H52222 Regular astigmatism, left eye: Secondary | ICD-10-CM | POA: Diagnosis not present

## 2023-12-03 DIAGNOSIS — H524 Presbyopia: Secondary | ICD-10-CM | POA: Diagnosis not present

## 2023-12-04 ENCOUNTER — Ambulatory Visit: Admitting: Physical Therapy

## 2023-12-04 ENCOUNTER — Encounter: Payer: Self-pay | Admitting: Physical Therapy

## 2023-12-04 DIAGNOSIS — R252 Cramp and spasm: Secondary | ICD-10-CM

## 2023-12-04 DIAGNOSIS — M5459 Other low back pain: Secondary | ICD-10-CM | POA: Diagnosis not present

## 2023-12-04 DIAGNOSIS — R2681 Unsteadiness on feet: Secondary | ICD-10-CM | POA: Diagnosis not present

## 2023-12-04 DIAGNOSIS — M25551 Pain in right hip: Secondary | ICD-10-CM | POA: Diagnosis not present

## 2023-12-04 DIAGNOSIS — M47816 Spondylosis without myelopathy or radiculopathy, lumbar region: Secondary | ICD-10-CM | POA: Diagnosis not present

## 2023-12-04 NOTE — Therapy (Signed)
 OUTPATIENT PHYSICAL THERAPY THORACOLUMBAR EVALUATION   Patient Name: Ann Griffith MRN: [id number] DOB:08-16-1950, 73 y.o., female Today's Date: 12/04/2023  END OF SESSION:  PT End of Session - 12/04/23 0802     Visit Number 2    Date for PT Re-Evaluation 01/21/24    Authorization Type aetna mcr    Progress Note Due on Visit 10    PT Start Time 0802    PT Stop Time 0845    PT Time Calculation (min) 43 min    Activity Tolerance Patient tolerated treatment well    Behavior During Therapy WFL for tasks assessed/performed              Past Medical History:  Diagnosis Date   CHOLELITHIASIS, HX OF 11/09/2007   Qualifier: Diagnosis of  By: Coleridge Davenport     DIABETES MELLITUS, TYPE II 11/09/2007   Qualifier: Diagnosis of  By: Coleridge Davenport     Diverticulosis    Hyperlipidemia    Past Surgical History:  Procedure Laterality Date   bone spur removed  2003   left heel   CATARACT EXTRACTION Left 02/2016   CHOLECYSTECTOMY     COLONOSCOPY  08/2003   Patient Active Problem List   Diagnosis Date Noted   Need for RSV immunization 10/29/2023   Macrocytic anemia 08/12/2023   Elevated ferritin 08/12/2023   Hyponatremia 08/12/2023   Elevated MCV 08/12/2023   It band syndrome, left 12/27/2022   Thyroid  disorder screening 09/27/2022   Trouble in sleeping 06/26/2022   Type 2 diabetes mellitus with stage 3a chronic kidney disease, without long-term current use of insulin (HCC) 06/26/2022   Elevated vitamin B12 level 02/13/2022   Decreased GFR 02/13/2022   Mild cognitive impairment 01/14/2022   Irritable bowel syndrome with diarrhea 12/24/2021   Left lumbar radiculitis 07/31/2021   Gastroesophageal reflux disease without esophagitis 06/13/2021   Change in consistency of stool 06/13/2021   Squamous cell carcinoma in situ 03/19/2021   Stage 3a chronic kidney disease (HCC) 03/14/2021   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 03/13/2021   DDD (degenerative disc disease),  lumbar 12/12/2020   Facet arthritis, degenerative, lumbar spine 06/28/2020   Acute left-sided low back pain with left-sided sciatica 06/28/2020   Iron deficiency anemia 06/13/2020   Myalgia 03/14/2020   Degenerative disc disease, cervical 12/07/2019   Toe pain, right 12/01/2019   Leg cramping 09/03/2019   Statin intolerance 07/03/2018   Itching 04/27/2018   Chronic pain of both ankles 04/27/2018   Bilateral carpal tunnel syndrome 04/27/2018   Raynaud's phenomenon without gangrene 02/03/2018   Mitral regurgitation 12/19/2017   Tricuspid regurgitation 12/19/2017   Orthostatic hypotension 12/16/2017   Syncope and collapse 12/16/2017   Essential hypertension 12/10/2017   Concussion 12/08/2017   Fear of flying 09/28/2017   Primary osteoarthritis of both knees 06/26/2017   Carpal tunnel syndrome, right 06/26/2017   Dry skin dermatitis 06/26/2017   Elevated serum creatinine 12/10/2016   Cataract cortical, senile, left 06/04/2016   Chronic right shoulder pain 04/01/2016   RLS (restless legs syndrome) 03/29/2016   Insomnia 08/08/2015   Bilateral leg pain 08/08/2015   Shoulder bursitis 08/08/2015   Memory changes 05/05/2015   Seborrheic dermatitis 03/14/2015   Hyperlipidemia LDL goal <70 11/03/2014   BPPV (benign paroxysmal positional vertigo), right 11/02/2014   Microalbuminuria 11/02/2014   Raynaud's syndrome 08/09/2013   Diverticulosis 06/07/2013   Avitaminosis D 10/09/2012   Colon polyp 04/24/2011   Combined hyperlipidemia associated with type 2 diabetes  mellitus (HCC) 04/24/2011   Type 2 diabetes mellitus with complication, without long-term current use of insulin (HCC) 11/09/2007   SCHATZKI'S RING 11/09/2007   CHOLELITHIASIS, HX OF 11/09/2007    PCP: Araceli Knight, PA-C   REFERRING PROVIDER: Genetta Kenning, MD   REFERRING DIAG: [phone] (ICD-10-CM) - Spondylosis without myelopathy or radiculopathy, lumbar region   Rationale for Evaluation and Treatment:  Rehabilitation  THERAPY DIAG:  Other low back pain  Pain in right hip  Unsteadiness on feet  Cramp and spasm  ONSET DATE: 2 years  SUBJECTIVE:                                                                                                                                                                                           SUBJECTIVE STATEMENT: No new complaints. Pain when I get up, but no pain now.  PERTINENT HISTORY:  DM, OA in knees/back  PAIN:  Are you having pain? Yes: NPRS scale: 0 Pain location: B gluteals R>L Pain description: pinch Aggravating factors: in the morning Relieving factors: nothing  PRECAUTIONS: None  RED FLAGS: None   WEIGHT BEARING RESTRICTIONS: No  FALLS:  Has patient fallen in last 6 months? No  LIVING ENVIRONMENT: Lives with: lives with their spouse Lives in: House/apartment Stairs:  3 porch steps - no issues Has following equipment at home: Single point cane  OCCUPATION: retired  PLOF: Independent and Leisure: yard work, travels a lot  PATIENT GOALS: stop my butt form hurting, improve my balance  NEXT MD VISIT: end of April  OBJECTIVE:  Note: Objective measures were completed at Evaluation unless otherwise noted.  DIAGNOSTIC FINDINGS:  none  PATIENT SURVEYS:  Modified Oswestry 10 / 50 = 20.0 %   COGNITION: Overall cognitive status: Within functional limits for tasks assessed     SENSATION: WFL  MUSCLE LENGTH: R piriformis, R hip flexor quad, R HS  POSTURE: forward head and decreased lumbar lordosis  PALPATION: Pain with UPA lumbar mobs L, tightness with UPA mobs R some pain in L1/2 B  LUMBAR ROM:   AROM eval  Flexion WFL  Extension WFL  Right lateral flexion Full  Left lateral flexion Full * in R buttock  Right rotation 75%  Left rotation 75%   (Blank rows = not tested)  LOWER EXTREMITY ROM:   WNL  LOWER EXTREMITY MMT:    MMT Right eval Left eval  Hip flexion 4+ 5  Hip extension 5 5  Hip  abduction 5 5  Hip adduction 5 5  Hip internal rotation    Hip external rotation    Knee flexion 4- 5  Knee extension 5 5  Ankle dorsiflexion 5 5  Ankle plantarflexion    Ankle inversion    Ankle eversion     (Blank rows = not tested)  LUMBAR SPECIAL TESTS:  Quadrant test: Positive and FABER test: Negative  FUNCTIONAL TESTS:  5 times sit to stand: 13.15 sec SLS < 2 sec B Stairs eccentric quad weakness Romberg 30 sec EO and EC   TREATMENT DATE:                                                                                                                               12/04/23 DGI completed BERG completed HS stretch with strap 2 x 30 x sec R, x 1 min L Quad stretch with strap of Side of bed x 1 min  R - caused some tingling in her lower leg and foot (stick with 30 second hold). L x 30 sec Checked prone quad stretch - WNL Plank on knees x 30 sec Standard plank x 2 10 sec with cues for correct form Added SLS and tandem stance to HEP  11/26/23 See pt ed and HEP    PATIENT EDUCATION:  Education details: PT eval findings, anticipated POC, initial HEP, and role of DN  Person educated: Patient Education method: Explanation, Demonstration, and Handouts Education comprehension: verbalized understanding and returned demonstration  HOME EXERCISE PROGRAM: Access Code: [email or web address] URL: [email or web address] Date: 12/04/2023 Prepared by: Concha Deed  Exercises - Supine Hamstring Stretch with Strap  - 2 x daily - 7 x weekly - 2 sets - 3 reps - 30 sec hold - Supine Quadriceps Stretch with Strap on Table  - 2 x daily - 7 x weekly - 1 sets - 3 reps - 30 sec hold - Seated Piriformis Stretch with Trunk Bend  - 2 x daily - 7 x weekly - 1 sets - 3 reps - 30-60 sec  hold - Single Leg Stance  - 1 x daily - 7 x weekly - 1 sets - 10 reps - max hold - Tandem Stance in Corner  - 1 x daily - 7 x weekly - 1 sets - 10 reps - max  hold - Step Taps on High Step  - 1 x daily - 7 x weekly - 2  sets - 10 reps - Standard Plank  - 2 x daily - 7 x weekly - 1 sets - 5 reps - max hold      ASSESSMENT:  CLINICAL IMPRESSION: Patient scored 23/24 on the DGI meeting LTG 6. Her BERG score of 51/56 puts her at moderate risk for falls. She is very challenged in tandem and SLS activities. HEP progressed with these and core strengthening. She has ongoing pain in the R buttock to be addressed next visit.  Eval: Patient is a 73 y.o. female who was seen today for physical therapy evaluation and treatment for lumbar spondylosis. She has pain in primarily her R buttock, but sometimes left also. Her  pain is worse with sleeping and flexion activities such as vacuuming and weeding. She has mild strength deficits with MMT, but demonstrates functional weakness with SLS activities and stairs. She is unable to stand on either leg independently for more than 2 sec  and she complains of balance problems when walking and with ADLS. She will benefit from skilled PT to address these deficits.    OBJECTIVE IMPAIRMENTS: Abnormal gait, decreased activity tolerance, decreased balance, decreased ROM, decreased strength, hypomobility, increased muscle spasms, impaired flexibility, postural dysfunction, and pain.   ACTIVITY LIMITATIONS: bending, squatting, sleeping, and stairs  PARTICIPATION LIMITATIONS: cleaning, community activity, and yard work  PERSONAL FACTORS: Age, Past/current experiences, Time since onset of injury/illness/exacerbation, and 1-2 comorbidities: LBP, DM  are also affecting patient's functional outcome.   REHAB POTENTIAL: Excellent  CLINICAL DECISION MAKING: Evolving/moderate complexity  EVALUATION COMPLEXITY: Moderate   GOALS: Goals reviewed with patient? Yes  SHORT TERM GOALS: Target date: 12/24/2023   Patient will be independent with initial HEP.  Baseline:  Goal status: INITIAL  2.  Patient will report decreased buttock pain by 25% with ADLS and sleeping.  Baseline:  Goal status:  INITIAL  3.  Balance assessment completed and goals set at 2nd visit. Baseline:  Goal status: INITIAL   LONG TERM GOALS: Target date: 01/21/2024   Patient will be independent with advanced/ongoing HEP to improve outcomes and carryover.  Baseline:  Goal status: INITIAL  2.  Patient will report >= 80% improvement in buttock pain with ADLs to improve QOL.  Baseline:  Goal status: INITIAL  3.  Patient able to stand from squatting position without LOB to improve safety with gardening.   Baseline:  Goal status: INITIAL  4.  Patient will report improvement of pain with sleeping by 50% or more. Baseline:  Goal status: INITIAL  5.  Patient will improve 4-5 points on the BERG to decrease fall risk.  Baseline: 51/56 Goal status: INITIAL  6.  Patient to score > 19 on DGI to decrease fall risk . Baseline: TBD Goal status: MET 12/04/23  23/24    PLAN:  PT FREQUENCY: 2x/week  PT DURATION: 8 weeks  PLANNED INTERVENTIONS: 97164- PT Re-evaluation, 97110-Therapeutic exercises, 97530- Therapeutic activity, 97112- Neuromuscular re-education, 97535- Self Care, 96045- Manual therapy, [phone]- Gait training, [phone]- Electrical stimulation (unattended), [phone]- Traction (mechanical), Patient/Family education, Balance training, Stair training, Taping, Dry Needling, Joint mobilization, Spinal mobilization, Cryotherapy, and Moist heat.  PLAN FOR NEXT SESSION:  DN lumbar, focus on core and functional hip strength, eccentric quad strength, squat to stand    [employer], PT  12/04/2023, 12:18 PM

## 2023-12-06 ENCOUNTER — Other Ambulatory Visit: Payer: Self-pay | Admitting: Physician Assistant

## 2023-12-06 DIAGNOSIS — L853 Xerosis cutis: Secondary | ICD-10-CM

## 2023-12-08 DIAGNOSIS — H401422 Capsular glaucoma with pseudoexfoliation of lens, left eye, moderate stage: Secondary | ICD-10-CM | POA: Diagnosis not present

## 2023-12-09 ENCOUNTER — Ambulatory Visit: Admitting: Physical Therapy

## 2023-12-09 ENCOUNTER — Encounter: Payer: Self-pay | Admitting: Physical Therapy

## 2023-12-09 DIAGNOSIS — M47816 Spondylosis without myelopathy or radiculopathy, lumbar region: Secondary | ICD-10-CM | POA: Diagnosis not present

## 2023-12-09 DIAGNOSIS — M25551 Pain in right hip: Secondary | ICD-10-CM

## 2023-12-09 DIAGNOSIS — R2681 Unsteadiness on feet: Secondary | ICD-10-CM | POA: Diagnosis not present

## 2023-12-09 DIAGNOSIS — M5459 Other low back pain: Secondary | ICD-10-CM

## 2023-12-09 DIAGNOSIS — R252 Cramp and spasm: Secondary | ICD-10-CM

## 2023-12-09 NOTE — Therapy (Signed)
 OUTPATIENT PHYSICAL THERAPY THORACOLUMBAR TREATMENT   Patient Name: Ann Griffith MRN: [id number] DOB:[birth year], 73 y.o., female Today's Date: 12/09/2023  END OF SESSION:  PT End of Session - 12/09/23 1017     Visit Number 3    Date for PT Re-Evaluation 01/21/24    Authorization Type aetna mcr    PT Start Time 0935    PT Stop Time 1016    PT Time Calculation (min) 41 min    Activity Tolerance Patient tolerated treatment well    Behavior During Therapy WFL for tasks assessed/performed               Past Medical History:  Diagnosis Date   CHOLELITHIASIS, HX OF 11/09/2007   Qualifier: Diagnosis of  By: Coleridge Davenport     DIABETES MELLITUS, TYPE II 11/09/2007   Qualifier: Diagnosis of  By: Coleridge Davenport     Diverticulosis    Hyperlipidemia    Past Surgical History:  Procedure Laterality Date   bone spur removed  2003   left heel   CATARACT EXTRACTION Left 02/2016   CHOLECYSTECTOMY     COLONOSCOPY  08/2003   Patient Active Problem List   Diagnosis Date Noted   Need for RSV immunization 10/29/2023   Macrocytic anemia 08/12/2023   Elevated ferritin 08/12/2023   Hyponatremia 08/12/2023   Elevated MCV 08/12/2023   It band syndrome, left 12/27/2022   Thyroid  disorder screening 09/27/2022   Trouble in sleeping 06/26/2022   Type 2 diabetes mellitus with stage 3a chronic kidney disease, without long-term current use of insulin (HCC) 06/26/2022   Elevated vitamin B12 level 02/13/2022   Decreased GFR 02/13/2022   Mild cognitive impairment 01/14/2022   Irritable bowel syndrome with diarrhea 12/24/2021   Left lumbar radiculitis 07/31/2021   Gastroesophageal reflux disease without esophagitis 06/13/2021   Change in consistency of stool 06/13/2021   Squamous cell carcinoma in situ 03/19/2021   Stage 3a chronic kidney disease (HCC) 03/14/2021   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 03/13/2021   DDD (degenerative disc disease), lumbar 12/12/2020   Facet  arthritis, degenerative, lumbar spine 06/28/2020   Acute left-sided low back pain with left-sided sciatica 06/28/2020   Iron deficiency anemia 06/13/2020   Myalgia 03/14/2020   Degenerative disc disease, cervical 12/07/2019   Toe pain, right 12/01/2019   Leg cramping 09/03/2019   Statin intolerance 07/03/2018   Itching 04/27/2018   Chronic pain of both ankles 04/27/2018   Bilateral carpal tunnel syndrome 04/27/2018   Raynaud's phenomenon without gangrene 02/03/2018   Mitral regurgitation 12/19/2017   Tricuspid regurgitation 12/19/2017   Orthostatic hypotension 12/16/2017   Syncope and collapse 12/16/2017   Essential hypertension 12/10/2017   Concussion 12/08/2017   Fear of flying 09/28/2017   Primary osteoarthritis of both knees 06/26/2017   Carpal tunnel syndrome, right 06/26/2017   Dry skin dermatitis 06/26/2017   Elevated serum creatinine 12/10/2016   Cataract cortical, senile, left 06/04/2016   Chronic right shoulder pain 04/01/2016   RLS (restless legs syndrome) 03/29/2016   Insomnia 08/08/2015   Bilateral leg pain 08/08/2015   Shoulder bursitis 08/08/2015   Memory changes 05/05/2015   Seborrheic dermatitis 03/14/2015   Hyperlipidemia LDL goal <70 11/03/2014   BPPV (benign paroxysmal positional vertigo), right 11/02/2014   Microalbuminuria 11/02/2014   Raynaud's syndrome 08/09/2013   Diverticulosis 06/07/2013   Avitaminosis D 10/09/2012   Colon polyp 04/24/2011   Combined hyperlipidemia associated with type 2 diabetes mellitus (HCC) 04/24/2011   Type 2 diabetes  mellitus with complication, without long-term current use of insulin (HCC) 11/09/2007   SCHATZKI'S RING 11/09/2007   CHOLELITHIASIS, HX OF 11/09/2007    PCP: Araceli Knight, PA-C   REFERRING PROVIDER: Genetta Kenning, MD   REFERRING DIAG: [phone] (ICD-10-CM) - Spondylosis without myelopathy or radiculopathy, lumbar region   Rationale for Evaluation and Treatment: Rehabilitation  THERAPY DIAG:   Other low back pain  Pain in right hip  Unsteadiness on feet  Cramp and spasm  ONSET DATE: 2 years  SUBJECTIVE:                                                                                                                                                                                           SUBJECTIVE STATEMENT: My balance is still way off. The quad stretch sets off the butt pain.  PERTINENT HISTORY:  DM, OA in knees/back  PAIN:  Are you having pain? Yes: NPRS scale: 0 Pain location: B gluteals R>L Pain description: pinch Aggravating factors: in the morning Relieving factors: nothing  PRECAUTIONS: None  RED FLAGS: None   WEIGHT BEARING RESTRICTIONS: No  FALLS:  Has patient fallen in last 6 months? No  LIVING ENVIRONMENT: Lives with: lives with their spouse Lives in: House/apartment Stairs:  3 porch steps - no issues Has following equipment at home: Single point cane  OCCUPATION: retired  PLOF: Independent and Leisure: yard work, travels a lot  PATIENT GOALS: stop my butt form hurting, improve my balance  NEXT MD VISIT: end of April  OBJECTIVE:  Note: Objective measures were completed at Evaluation unless otherwise noted.  DIAGNOSTIC FINDINGS:  none  PATIENT SURVEYS:  Modified Oswestry 10 / 50 = 20.0 %   COGNITION: Overall cognitive status: Within functional limits for tasks assessed     SENSATION: WFL  MUSCLE LENGTH: R piriformis, R hip flexor quad, R HS  POSTURE: forward head and decreased lumbar lordosis  PALPATION: Pain with UPA lumbar mobs L, tightness with UPA mobs R some pain in L1/2 B  LUMBAR ROM:   AROM eval  Flexion WFL  Extension WFL  Right lateral flexion Full  Left lateral flexion Full * in R buttock  Right rotation 75%  Left rotation 75%   (Blank rows = not tested)  LOWER EXTREMITY ROM:   WNL  LOWER EXTREMITY MMT:    MMT Right eval Left eval  Hip flexion 4+ 5  Hip extension 5 5  Hip abduction 5 5  Hip  adduction 5 5  Hip internal rotation    Hip external rotation    Knee flexion 4- 5  Knee extension 5 5  Ankle  dorsiflexion 5 5  Ankle plantarflexion    Ankle inversion    Ankle eversion     (Blank rows = not tested)  LUMBAR SPECIAL TESTS:  Quadrant test: Positive and FABER test: Negative  FUNCTIONAL TESTS:  5 times sit to stand: 13.15 sec SLS < 2 sec B Stairs eccentric quad weakness Romberg 30 sec EO and EC   TREATMENT DATE:                                                                                                                               12/09/23 Manual: UPA mobs to B lumbar - pain at B L3/4, L2/3 Manual lumbar traction with ball and belt 3x20  Self traction techniques: sink and hanging from bed and matrix 90/90 alt leg press - tough 90/90 heel taps x 10 ea 90/90 Blue [place] shoulder pull x 6  - do green next time Seated QL stretch x 30 sec and Doorway QL stretch  12/04/23 DGI completed BERG completed HS stretch with strap 2 x 30 x sec R, x 1 min L Quad stretch with strap of Side of bed x 1 min  R - caused some tingling in her lower leg and foot (stick with 30 second hold). L x 30 sec Checked prone quad stretch - WNL Plank on knees x 30 sec Standard plank x 2 10 sec with cues for correct form Added SLS and tandem stance to HEP  11/26/23 See pt ed and HEP    PATIENT EDUCATION:  Education details: PT eval findings, anticipated POC, initial HEP, and role of DN  Person educated: Patient Education method: Explanation, Demonstration, and Handouts Education comprehension: verbalized understanding and returned demonstration  HOME EXERCISE PROGRAM: Access Code: [email or web address] URL: [email or web address] Date: 12/09/2023 Prepared by: Concha Deed  Exercises - Supine Hamstring Stretch with Strap  - 2 x daily - 7 x weekly - 2 sets - 3 reps - 30 sec hold - Supine Quadriceps Stretch with Strap on Table  - 2 x daily - 7 x weekly - 1 sets - 3 reps - 30 sec hold -  Seated Piriformis Stretch with Trunk Bend  - 2 x daily - 7 x weekly - 1 sets - 3 reps - 30-60 sec  hold - Single Leg Stance  - 1 x daily - 7 x weekly - 1 sets - 10 reps - max hold - Tandem Stance in Corner  - 1 x daily - 7 x weekly - 1 sets - 10 reps - max  hold - Step Taps on High Step  - 1 x daily - 7 x weekly - 2 sets - 10 reps - Standard Plank  - 2 x daily - 7 x weekly - 1 sets - 5 reps - max hold - Standing Lumbar Spine Flexion Stretch Counter  - 1 x daily - 3 x weekly - 2 sets - 10 reps - Standing Quadratus Lumborum Stretch with Doorway (Mirrored)  -  2 x daily - 7 x weekly - 1 sets - 2 reps - 30 sec hold - Supine 90/90 Alternating Heel Touches with Posterior Pelvic Tilt  - 1 x daily - 3 x weekly - 2 sets - 10 reps      ASSESSMENT:  CLINICAL IMPRESSION: Ahmari reports ongoing balance issues and ongoing back pain. She has pain and also relief with UPA mobs to B lumbar spine and also with self traction. She appears tight in her lumbar multifidi and possibly hip flexors as well as R QL. She would likely benefit from DN next visit. Very challenged with 90/90 core exercises.   Eval: Patient is a 73 y.o. female who was seen today for physical therapy evaluation and treatment for lumbar spondylosis. She has pain in primarily her R buttock, but sometimes left also. Her pain is worse with sleeping and flexion activities such as vacuuming and weeding. She has mild strength deficits with MMT, but demonstrates functional weakness with SLS activities and stairs. She is unable to stand on either leg independently for more than 2 sec  and she complains of balance problems when walking and with ADLS. She will benefit from skilled PT to address these deficits.    OBJECTIVE IMPAIRMENTS: Abnormal gait, decreased activity tolerance, decreased balance, decreased ROM, decreased strength, hypomobility, increased muscle spasms, impaired flexibility, postural dysfunction, and pain.   ACTIVITY LIMITATIONS:  bending, squatting, sleeping, and stairs  PARTICIPATION LIMITATIONS: cleaning, community activity, and yard work  PERSONAL FACTORS: Age, Past/current experiences, Time since onset of injury/illness/exacerbation, and 1-2 comorbidities: LBP, DM  are also affecting patient's functional outcome.   REHAB POTENTIAL: Excellent  CLINICAL DECISION MAKING: Evolving/moderate complexity  EVALUATION COMPLEXITY: Moderate   GOALS: Goals reviewed with patient? Yes  SHORT TERM GOALS: Target date: 12/24/2023   Patient will be independent with initial HEP.  Baseline:  Goal status: INITIAL  2.  Patient will report decreased buttock pain by 25% with ADLS and sleeping.  Baseline:  Goal status: INITIAL  3.  Balance assessment completed and goals set at 2nd visit. Baseline:  Goal status: INITIAL   LONG TERM GOALS: Target date: 01/21/2024   Patient will be independent with advanced/ongoing HEP to improve outcomes and carryover.  Baseline:  Goal status: INITIAL  2.  Patient will report >= 80% improvement in buttock pain with ADLs to improve QOL.  Baseline:  Goal status: INITIAL  3.  Patient able to stand from squatting position without LOB to improve safety with gardening.   Baseline:  Goal status: INITIAL  4.  Patient will report improvement of pain with sleeping by 50% or more. Baseline:  Goal status: INITIAL  5.  Patient will improve 4-5 points on the BERG to decrease fall risk.  Baseline: 51/56 Goal status: INITIAL  6.  Patient to score > 19 on DGI to decrease fall risk . Baseline: TBD Goal status: MET 12/04/23  23/24    PLAN:  PT FREQUENCY: 2x/week  PT DURATION: 8 weeks  PLANNED INTERVENTIONS: 97164- PT Re-evaluation, 97110-Therapeutic exercises, 97530- Therapeutic activity, 97112- Neuromuscular re-education, 97535- Self Care, 96045- Manual therapy, [phone]- Gait training, [phone]- Electrical stimulation (unattended), [phone]- Traction (mechanical), Patient/Family education,  Balance training, Stair training, Taping, Dry Needling, Joint mobilization, Spinal mobilization, Cryotherapy, and Moist heat.  PLAN FOR NEXT SESSION:  DN lumbar, focus on core and functional hip strength, eccentric quad strength, squat to stand    [employer], PT  12/09/2023, 12:42 PM

## 2023-12-11 ENCOUNTER — Ambulatory Visit: Attending: Physical Medicine & Rehabilitation | Admitting: Physical Therapy

## 2023-12-11 DIAGNOSIS — M25551 Pain in right hip: Secondary | ICD-10-CM | POA: Diagnosis not present

## 2023-12-11 DIAGNOSIS — R2681 Unsteadiness on feet: Secondary | ICD-10-CM | POA: Insufficient documentation

## 2023-12-11 DIAGNOSIS — M5459 Other low back pain: Secondary | ICD-10-CM | POA: Insufficient documentation

## 2023-12-11 DIAGNOSIS — R252 Cramp and spasm: Secondary | ICD-10-CM | POA: Insufficient documentation

## 2023-12-11 NOTE — Therapy (Signed)
 OUTPATIENT PHYSICAL THERAPY THORACOLUMBAR TREATMENT   Patient Name: Ann Griffith MRN: [id number] DOB:20-Mar-1951, 73 y.o., female Today's Date: 12/11/2023  END OF SESSION:  PT End of Session - 12/11/23 1019     Visit Number 4    Date for PT Re-Evaluation 01/21/24    Authorization Type aetna mcr    Progress Note Due on Visit 10    PT Start Time 1019    PT Stop Time 1102    PT Time Calculation (min) 43 min    Activity Tolerance Patient tolerated treatment well    Behavior During Therapy WFL for tasks assessed/performed                Past Medical History:  Diagnosis Date   CHOLELITHIASIS, HX OF 11/09/2007   Qualifier: Diagnosis of  By: Coleridge Davenport     DIABETES MELLITUS, TYPE II 11/09/2007   Qualifier: Diagnosis of  By: Coleridge Davenport     Diverticulosis    Hyperlipidemia    Past Surgical History:  Procedure Laterality Date   bone spur removed  2003   left heel   CATARACT EXTRACTION Left 02/2016   CHOLECYSTECTOMY     COLONOSCOPY  08/2003   Patient Active Problem List   Diagnosis Date Noted   Need for RSV immunization 10/29/2023   Macrocytic anemia 08/12/2023   Elevated ferritin 08/12/2023   Hyponatremia 08/12/2023   Elevated MCV 08/12/2023   It band syndrome, left 12/27/2022   Thyroid  disorder screening 09/27/2022   Trouble in sleeping 06/26/2022   Type 2 diabetes mellitus with stage 3a chronic kidney disease, without long-term current use of insulin (HCC) 06/26/2022   Elevated vitamin B12 level 02/13/2022   Decreased GFR 02/13/2022   Mild cognitive impairment 01/14/2022   Irritable bowel syndrome with diarrhea 12/24/2021   Left lumbar radiculitis 07/31/2021   Gastroesophageal reflux disease without esophagitis 06/13/2021   Change in consistency of stool 06/13/2021   Squamous cell carcinoma in situ 03/19/2021   Stage 3a chronic kidney disease (HCC) 03/14/2021   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 03/13/2021   DDD (degenerative disc disease),  lumbar 12/12/2020   Facet arthritis, degenerative, lumbar spine 06/28/2020   Acute left-sided low back pain with left-sided sciatica 06/28/2020   Iron deficiency anemia 06/13/2020   Myalgia 03/14/2020   Degenerative disc disease, cervical 12/07/2019   Toe pain, right 12/01/2019   Leg cramping 09/03/2019   Statin intolerance 07/03/2018   Itching 04/27/2018   Chronic pain of both ankles 04/27/2018   Bilateral carpal tunnel syndrome 04/27/2018   Raynaud's phenomenon without gangrene 02/03/2018   Mitral regurgitation 12/19/2017   Tricuspid regurgitation 12/19/2017   Orthostatic hypotension 12/16/2017   Syncope and collapse 12/16/2017   Essential hypertension 12/10/2017   Concussion 12/08/2017   Fear of flying 09/28/2017   Primary osteoarthritis of both knees 06/26/2017   Carpal tunnel syndrome, right 06/26/2017   Dry skin dermatitis 06/26/2017   Elevated serum creatinine 12/10/2016   Cataract cortical, senile, left 06/04/2016   Chronic right shoulder pain 04/01/2016   RLS (restless legs syndrome) 03/29/2016   Insomnia 08/08/2015   Bilateral leg pain 08/08/2015   Shoulder bursitis 08/08/2015   Memory changes 05/05/2015   Seborrheic dermatitis 03/14/2015   Hyperlipidemia LDL goal <70 11/03/2014   BPPV (benign paroxysmal positional vertigo), right 11/02/2014   Microalbuminuria 11/02/2014   Raynaud's syndrome 08/09/2013   Diverticulosis 06/07/2013   Avitaminosis D 10/09/2012   Colon polyp 04/24/2011   Combined hyperlipidemia associated with type  2 diabetes mellitus (HCC) 04/24/2011   Type 2 diabetes mellitus with complication, without long-term current use of insulin (HCC) 11/09/2007   SCHATZKI'S RING 11/09/2007   CHOLELITHIASIS, HX OF 11/09/2007    PCP: Araceli Knight, PA-C   REFERRING PROVIDER: Genetta Kenning, MD   REFERRING DIAG: [id number] (ICD-10-CM) - Spondylosis without myelopathy or radiculopathy, lumbar region   Rationale for Evaluation and Treatment:  Rehabilitation  THERAPY DIAG:  Other low back pain  Pain in right hip  Unsteadiness on feet  Cramp and spasm  ONSET DATE: 2 years  SUBJECTIVE:                                                                                                                                                                                           SUBJECTIVE STATEMENT: Felt great yesterday, but then woke up with left sided low back pain today.  PERTINENT HISTORY:  DM, OA in knees/back  PAIN:  Are you having pain? Yes: NPRS scale: 7 LEFT LOW BACK Pain location: B gluteals R>L Pain description: pinch Aggravating factors: in the morning Relieving factors: nothing  PRECAUTIONS: None  RED FLAGS: None   WEIGHT BEARING RESTRICTIONS: No  FALLS:  Has patient fallen in last 6 months? No  LIVING ENVIRONMENT: Lives with: lives with their spouse Lives in: House/apartment Stairs:  3 porch steps - no issues Has following equipment at home: Single point cane  OCCUPATION: retired  PLOF: Independent and Leisure: yard work, travels a lot  PATIENT GOALS: stop my butt form hurting, improve my balance  NEXT MD VISIT: end of April  OBJECTIVE:  Note: Objective measures were completed at Evaluation unless otherwise noted.  DIAGNOSTIC FINDINGS:  none  PATIENT SURVEYS:  Modified Oswestry 10 / 50 = 20.0 %   COGNITION: Overall cognitive status: Within functional limits for tasks assessed     SENSATION: WFL  MUSCLE LENGTH: R piriformis, R hip flexor quad, R HS  POSTURE: forward head and decreased lumbar lordosis  PALPATION: Pain with UPA lumbar mobs L, tightness with UPA mobs R some pain in L1/2 B  LUMBAR ROM:   AROM eval  Flexion WFL  Extension WFL  Right lateral flexion Full  Left lateral flexion Full * in R buttock  Right rotation 75%  Left rotation 75%   (Blank rows = not tested)  LOWER EXTREMITY ROM:   WNL  LOWER EXTREMITY MMT:    MMT Right eval Left eval  Hip flexion  4+ 5  Hip extension 5 5  Hip abduction 5 5  Hip adduction 5 5  Hip internal rotation    Hip external rotation  Knee flexion 4- 5  Knee extension 5 5  Ankle dorsiflexion 5 5  Ankle plantarflexion    Ankle inversion    Ankle eversion     (Blank rows = not tested)  LUMBAR SPECIAL TESTS:  Quadrant test: Positive and FABER test: Negative  FUNCTIONAL TESTS:  5 times sit to stand: 13.15 sec SLS < 2 sec B Stairs eccentric quad weakness Romberg 30 sec EO and EC   TREATMENT DATE:                                                                                                                               12/11/23 Manual:  UPA mobs to B lumbar  TPR to L QL STM to B lumbar L>R TherEx: Prone press ups x 10 Prone press ups x10 with overpressure at various levels. L2/3 causes tingling in to buttock R to leg Relief with double knee to chest DKTC 2x10 Long sitting fig 4 stretch 2x30 sec B Standing R QL doorway stretch x 50 seconds Standing QL stretch with active plantar flexion of back foot x 10 (feels in R low back)  12/09/23 Manual: UPA mobs to B lumbar - pain at B L3/4, L2/3 Manual lumbar traction with ball and belt 3x20  Self traction techniques: sink and hanging from bed and matrix 90/90 alt leg press - tough 90/90 heel taps x 10 ea 90/90 Blue OH shoulder pull x 6  - do green next time Seated QL stretch x 30 sec and Doorway QL stretch  12/04/23 DGI completed BERG completed HS stretch with strap 2 x 30 x sec R, x 1 min L Quad stretch with strap of Side of bed x 1 min  R - caused some tingling in her lower leg and foot (stick with 30 second hold). L x 30 sec Checked prone quad stretch - WNL Plank on knees x 30 sec Standard plank x 2 10 sec with cues for correct form Added SLS and tandem stance to HEP  11/26/23 See pt ed and HEP    PATIENT EDUCATION:  Education details: PT eval findings, anticipated POC, initial HEP, and role of DN  Person educated: Patient Education  method: Explanation, Demonstration, and Handouts Education comprehension: verbalized understanding and returned demonstration  HOME EXERCISE PROGRAM: Access Code: [email or web address] URL: [email or web address] Date: 12/11/2023 Prepared by: Concha Deed  Exercises - Supine Hamstring Stretch with Strap  - 2 x daily - 7 x weekly - 2 sets - 3 reps - 30 sec hold - Supine Quadriceps Stretch with Strap on Table  - 2 x daily - 7 x weekly - 1 sets - 3 reps - 30 sec hold - Seated Piriformis Stretch with Trunk Bend  - 2 x daily - 7 x weekly - 1 sets - 3 reps - 30-60 sec  hold - Single Leg Stance  - 1 x daily - 7 x weekly - 1 sets - 10 reps - max hold - Tandem  Stance in Corner  - 1 x daily - 7 x weekly - 1 sets - 10 reps - max  hold - Step Taps on High Step  - 1 x daily - 7 x weekly - 2 sets - 10 reps - Standard Plank  - 2 x daily - 7 x weekly - 1 sets - 5 reps - max hold - Standing Lumbar Spine Flexion Stretch Counter  - 1 x daily - 3 x weekly - 2 sets - 10 reps - Standing Quadratus Lumborum Stretch with Doorway (Mirrored)  - 2 x daily - 7 x weekly - 1 sets - 2 reps - 30 sec hold - Supine 90/90 Alternating Heel Touches with Posterior Pelvic Tilt  - 1 x daily - 3 x weekly - 2 sets - 10 reps - Supine Figure 4 Piriformis Stretch  - 1 x daily - 7 x weekly - 1 sets - 3 reps - 30-60 sec hold - Supine Double Knee to Chest  - 2 x daily - 7 x weekly - 1 sets - 3 reps - 20 sec hold      ASSESSMENT:  CLINICAL IMPRESSION: Ann Griffith presents with left sided low back pain today. She responded well to manual therapy and this resolved, however she then complained of R radicular sx into the buttock and leg. Extensions worsened sx. She had relief with DKTC and repeated flexion. At the end of session pain had centralized to her right low back. Her left hip pain may be separate from her back . She is TTP along her lateral quads. Plan to address muscle spasms in the back and leg with dry needling if patient agrees.  She continues to demonstrate potential for improvement and would benefit from continued skilled therapy to address impairments.     OBJECTIVE IMPAIRMENTS: Abnormal gait, decreased activity tolerance, decreased balance, decreased ROM, decreased strength, hypomobility, increased muscle spasms, impaired flexibility, postural dysfunction, and pain.   ACTIVITY LIMITATIONS: bending, squatting, sleeping, and stairs  PARTICIPATION LIMITATIONS: cleaning, community activity, and yard work  PERSONAL FACTORS: Age, Past/current experiences, Time since onset of injury/illness/exacerbation, and 1-2 comorbidities: LBP, DM  are also affecting patient's functional outcome.   REHAB POTENTIAL: Excellent  CLINICAL DECISION MAKING: Evolving/moderate complexity  EVALUATION COMPLEXITY: Moderate   GOALS: Goals reviewed with patient? Yes  SHORT TERM GOALS: Target date: 12/24/2023   Patient will be independent with initial HEP.  Baseline:  Goal status: IN PROGRESS  2.  Patient will report decreased buttock pain by 25% with ADLS and sleeping.  Baseline:  Goal status:IN PROGRESS  3.  Balance assessment completed and goals set at 2nd visit. Baseline:  Goal status: MET   LONG TERM GOALS: Target date: 01/21/2024   Patient will be independent with advanced/ongoing HEP to improve outcomes and carryover.  Baseline:  Goal status: INITIAL  2.  Patient will report >= 80% improvement in buttock pain with ADLs to improve QOL.  Baseline:  Goal status: INITIAL  3.  Patient able to stand from squatting position without LOB to improve safety with gardening.   Baseline:  Goal status: INITIAL  4.  Patient will report improvement of pain with sleeping by 50% or more. Baseline:  Goal status: INITIAL  5.  Patient will improve 4-5 points on the BERG to decrease fall risk.  Baseline: 51/56 Goal status: INITIAL  6.  Patient to score > 19 on DGI to decrease fall risk . Baseline: TBD Goal status: MET 12/04/23   23/24  PLAN:  PT FREQUENCY: 2x/week  PT DURATION: 8 weeks  PLANNED INTERVENTIONS: 97164- PT Re-evaluation, 97110-Therapeutic exercises, 97530- Therapeutic activity, 97112- Neuromuscular re-education, 97535- Self Care, 60454- Manual therapy, [phone]- Gait training, [phone]- Electrical stimulation (unattended), [phone]- Traction (mechanical), Patient/Family education, Balance training, Stair training, Taping, Dry Needling, Joint mobilization, Spinal mobilization, Cryotherapy, and Moist heat.  PLAN FOR NEXT SESSION:  DN lumbar left lateral quads, assess response to flexion biased exercise, focus on core and functional hip strength, eccentric quad strength, squat to stand    [employer], PT  12/11/2023, 1:00 PM

## 2023-12-12 ENCOUNTER — Encounter: Attending: Physical Medicine & Rehabilitation | Admitting: Physical Medicine & Rehabilitation

## 2023-12-12 ENCOUNTER — Encounter: Payer: Self-pay | Admitting: Physical Medicine & Rehabilitation

## 2023-12-12 VITALS — BP 106/69 | HR 77 | Ht 62.0 in | Wt 150.8 lb

## 2023-12-12 DIAGNOSIS — M5416 Radiculopathy, lumbar region: Secondary | ICD-10-CM | POA: Diagnosis not present

## 2023-12-12 NOTE — Progress Notes (Signed)
 Subjective:    Patient ID: Ann Griffith, female    DOB: 11/01/1950, 73 y.o.   MRN: [id number]  HPI  Still with severe pinching pain in Right buttocks, tingling goes into the right foot intermittently .  Symptoms worse first thing in am .  She cannot tell me whether it is any particular toe or the top or bottom of her foot that bother her the most.  Patient has had 4 therapy sessions since I last saw her approximately 6 weeks ago.  Her symptoms have not improved take any significant degree she does still have a few more sessions left.  No numbness  Pain Inventory Average Pain 8 Pain Right Now 0 My pain is intermittent, sharp, tingling, and aching  In the last 24 hours, has pain interfered with the following? General activity 7 Relation with others 0 Enjoyment of life 0 What TIME of day is your pain at its worst? morning , daytime, evening, and night Sleep (in general) Fair  Pain is worse with: sitting, standing, and some activites Pain improves with: rest Relief from Meds:  .  Family History  Problem Relation Age of Onset   Diabetes Mother    Social History   Socioeconomic History   Marital status: Married    Spouse name: Ambrosio Junker   Number of children: 1   Years of education: 12   Highest education level: High school graduate  Occupational History   Occupation: data entry Solicitor in receiving    Comment: Retired  Tobacco Use   Smoking status: Never   Smokeless tobacco: Never  Vaping Use   Vaping status: Never Used  Substance and Sexual Activity   Alcohol use: Yes    Alcohol/week: 0.0 - 1.0 standard drinks of alcohol    Comment: occasionally   Drug use: No   Sexual activity: Yes    Partners: Male    Birth control/protection: Post-menopausal  Other Topics Concern   Not on file  Social History Narrative   Lives with her husband. Likes to go to movies and travel.    Social Drivers of Corporate investment banker Strain: Low Risk  (04/09/2023)   Overall Financial  Resource Strain (CARDIA)    Difficulty of Paying Living Expenses: Not hard at all  Food Insecurity: No Food Insecurity (04/09/2023)   Hunger Vital Sign    Worried About Running Out of Food in the Last Year: Never true    Ran Out of Food in the Last Year: Never true  Transportation Needs: No Transportation Needs (04/09/2023)   PRAPARE - Administrator, Civil Service (Medical): No    Lack of Transportation (Non-Medical): No  Physical Activity: Sufficiently Active (04/09/2023)   Exercise Vital Sign    Days of Exercise per Week: 3 days    Minutes of Exercise per Session: 60 min  Stress: No Stress Concern Present (04/09/2023)   [employer] of Occupational Health - Occupational Stress Questionnaire    Feeling of Stress : Not at all  Social Connections: Moderately Isolated (04/09/2023)   Social Connection and Isolation Panel [NHANES]    Frequency of Communication with Friends and Family: Once a week    Frequency of Social Gatherings with Friends and Family: More than three times a week    Attends Religious Services: Never    Database administrator or Organizations: No    Attends Banker Meetings: Never    Marital Status: Married   Past Surgical History:  Procedure  Laterality Date   bone spur removed  2003   left heel   CATARACT EXTRACTION Left 02/2016   CHOLECYSTECTOMY     COLONOSCOPY  08/2003   Past Surgical History:  Procedure Laterality Date   bone spur removed  2003   left heel   CATARACT EXTRACTION Left 02/2016   CHOLECYSTECTOMY     COLONOSCOPY  08/2003   Past Medical History:  Diagnosis Date   CHOLELITHIASIS, HX OF 11/09/2007   Qualifier: Diagnosis of  By: Coleridge Davenport     DIABETES MELLITUS, TYPE II 11/09/2007   Qualifier: Diagnosis of  By: Coleridge Davenport     Diverticulosis    Hyperlipidemia    LMP 08/12/2000   Opioid Risk Score:   Fall Risk Score:  `1  Depression screen PHQ 2/9     10/31/2023    2:10 PM 07/29/2023   12:18 PM  04/09/2023    8:22 AM 03/31/2023    3:17 PM 03/31/2023    8:14 AM 09/27/2022    8:20 AM 06/26/2022    7:54 AM  Depression screen PHQ 2/9  Decreased Interest 0 0 0 0 0 0 0  Down, Depressed, Hopeless 0 0 0 0 0 0 0  PHQ - 2 Score 0 0 0 0 0 0 0  Altered sleeping 1        Tired, decreased energy 1        Change in appetite 0        Feeling bad or failure about yourself  0        Trouble concentrating 1        Suicidal thoughts 0        PHQ-9 Score 3           Review of Systems  Musculoskeletal:  Positive for back pain.  All other systems reviewed and are negative.     Objective:   Physical Exam General No acute distress Mood and affect appropriate Negative straight leg raise bilaterally Sensation intact to pinprick bilateral L234.5 S1 dermatomal distribution Tone is normal in lower limbs Motor strength is 5/5 bilateral hip flexor knee extensor ankle dorsiflexor Deep tendon reflexes absent bilateral ankles 1+ left knee 2+ right knee ambulates without assistive device no evidence of drag or instability  Tenderness to palpation over the right buttock area no tenderness over the ischial bursa bilaterally no tenderness of the greater trochanteric post bursa.  No tenderness over the PSIS  Sacral thrust (prone) : Negative Lateral compression: Negative FABER's: Negative Distraction (supine): Negative Thigh thrust test: Negative     Assessment & Plan:  1.  Patient pain in right buttock with intermittent numbness going down into the right foot.  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  She has tried conservative care including physical therapy over the last 4 to 6 weeks without any significant improvement pain level still in the severe range. I have recommended an MRI of the lumbar spine without contrast Continue her remaining visits with physical therapy I will see her back once MRI completed

## 2023-12-16 ENCOUNTER — Ambulatory Visit

## 2023-12-16 DIAGNOSIS — R2681 Unsteadiness on feet: Secondary | ICD-10-CM

## 2023-12-16 DIAGNOSIS — M5459 Other low back pain: Secondary | ICD-10-CM

## 2023-12-16 DIAGNOSIS — M25551 Pain in right hip: Secondary | ICD-10-CM

## 2023-12-16 DIAGNOSIS — R252 Cramp and spasm: Secondary | ICD-10-CM

## 2023-12-16 NOTE — Therapy (Signed)
 OUTPATIENT PHYSICAL THERAPY THORACOLUMBAR TREATMENT   Patient Name: Ann Griffith MRN: [id number] DOB:1950/09/12, 73 y.o., female Today's Date: 12/16/2023  END OF SESSION:  PT End of Session - 12/16/23 0929     Visit Number 5    Date for PT Re-Evaluation 01/21/24    Authorization Type [employer]    Progress Note Due on Visit 10    PT Start Time 0930    PT Stop Time 1014    PT Time Calculation (min) 44 min    Activity Tolerance Patient tolerated treatment well    Behavior During Therapy WFL for tasks assessed/performed            Past Medical History:  Diagnosis Date   CHOLELITHIASIS, HX OF 11/09/2007   Qualifier: Diagnosis of  By: Coleridge Davenport     DIABETES MELLITUS, TYPE II 11/09/2007   Qualifier: Diagnosis of  By: Coleridge Davenport     Diverticulosis    Hyperlipidemia    Past Surgical History:  Procedure Laterality Date   bone spur removed  2003   left heel   CATARACT EXTRACTION Left 02/2016   CHOLECYSTECTOMY     COLONOSCOPY  08/2003   Patient Active Problem List   Diagnosis Date Noted   Need for RSV immunization 10/29/2023   Macrocytic anemia 08/12/2023   Elevated ferritin 08/12/2023   Hyponatremia 08/12/2023   Elevated MCV 08/12/2023   It band syndrome, left 12/27/2022   Thyroid  disorder screening 09/27/2022   Trouble in sleeping 06/26/2022   Type 2 diabetes mellitus with stage 3a chronic kidney disease, without long-term current use of insulin (HCC) 06/26/2022   Elevated vitamin B12 level 02/13/2022   Decreased GFR 02/13/2022   Mild cognitive impairment 01/14/2022   Irritable bowel syndrome with diarrhea 12/24/2021   Left lumbar radiculitis 07/31/2021   Gastroesophageal reflux disease without esophagitis 06/13/2021   Change in consistency of stool 06/13/2021   Squamous cell carcinoma in situ 03/19/2021   Stage 3a chronic kidney disease (HCC) 03/14/2021   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 03/13/2021   DDD (degenerative disc disease), lumbar  12/12/2020   Facet arthritis, degenerative, lumbar spine 06/28/2020   Acute left-sided low back pain with left-sided sciatica 06/28/2020   Iron deficiency anemia 06/13/2020   Myalgia 03/14/2020   Degenerative disc disease, cervical 12/07/2019   Toe pain, right 12/01/2019   Leg cramping 09/03/2019   Statin intolerance 07/03/2018   Itching 04/27/2018   Chronic pain of both ankles 04/27/2018   Bilateral carpal tunnel syndrome 04/27/2018   Raynaud's phenomenon without gangrene 02/03/2018   Mitral regurgitation 12/19/2017   Tricuspid regurgitation 12/19/2017   Orthostatic hypotension 12/16/2017   Syncope and collapse 12/16/2017   Essential hypertension 12/10/2017   Concussion 12/08/2017   Fear of flying 09/28/2017   Primary osteoarthritis of both knees 06/26/2017   Carpal tunnel syndrome, right 06/26/2017   Dry skin dermatitis 06/26/2017   Elevated serum creatinine 12/10/2016   Cataract cortical, senile, left 06/04/2016   Chronic right shoulder pain 04/01/2016   RLS (restless legs syndrome) 03/29/2016   Insomnia 08/08/2015   Bilateral leg pain 08/08/2015   Shoulder bursitis 08/08/2015   Memory changes 05/05/2015   Seborrheic dermatitis 03/14/2015   Hyperlipidemia LDL goal <70 11/03/2014   BPPV (benign paroxysmal positional vertigo), right 11/02/2014   Microalbuminuria 11/02/2014   Raynaud's syndrome 08/09/2013   Diverticulosis 06/07/2013   Avitaminosis D 10/09/2012   Colon polyp 04/24/2011   Combined hyperlipidemia associated with type 2 diabetes mellitus (HCC)  04/24/2011   Type 2 diabetes mellitus with complication, without long-term current use of insulin (HCC) 11/09/2007   SCHATZKI'S RING 11/09/2007   CHOLELITHIASIS, HX OF 11/09/2007    PCP: Araceli Knight, PA-C   REFERRING PROVIDER: Genetta Kenning, MD   REFERRING DIAG: [phone] (ICD-10-CM) - Spondylosis without myelopathy or radiculopathy, lumbar region   Rationale for Evaluation and Treatment:  Rehabilitation  THERAPY DIAG:  Other low back pain  Pain in right hip  Unsteadiness on feet  Cramp and spasm  ONSET DATE: 2 years  SUBJECTIVE:                                                                                                                                                                                           SUBJECTIVE STATEMENT: Patient reports she is feeling good today; states yard work is still most challenging. Patient states she has an MRI scheduled; states she remember she bruised her tailbone as a kid.   PERTINENT HISTORY:  DM, OA in knees/back  PAIN:  Are you having pain? Yes: NPRS scale: 7 LEFT LOW BACK Pain location: B gluteals R>L Pain description: pinch Aggravating factors: in the morning Relieving factors: nothing  PRECAUTIONS: None  RED FLAGS: None   WEIGHT BEARING RESTRICTIONS: No  FALLS:  Has patient fallen in last 6 months? No  LIVING ENVIRONMENT: Lives with: lives with their spouse Lives in: House/apartment Stairs:  3 porch steps - no issues Has following equipment at home: Single point cane  OCCUPATION: retired  PLOF: Independent and Leisure: yard work, travels a lot  PATIENT GOALS: stop my butt form hurting, improve my balance  NEXT MD VISIT: 01/21/24  OBJECTIVE:  Note: Objective measures were completed at Evaluation unless otherwise noted.  DIAGNOSTIC FINDINGS:  none  PATIENT SURVEYS:  Modified Oswestry 10 / 50 = 20.0 %   COGNITION: Overall cognitive status: Within functional limits for tasks assessed     SENSATION: WFL  MUSCLE LENGTH: R piriformis, R hip flexor quad, R HS  POSTURE: forward head and decreased lumbar lordosis  PALPATION: Pain with UPA lumbar mobs L, tightness with UPA mobs R some pain in L1/2 B  LUMBAR ROM:   AROM eval  Flexion WFL  Extension WFL  Right lateral flexion Full  Left lateral flexion Full * in R buttock  Right rotation 75%  Left rotation 75%   (Blank rows = not  tested)  LOWER EXTREMITY ROM:   WNL  LOWER EXTREMITY MMT:    MMT Right eval Left eval  Hip flexion 4+ 5  Hip extension 5 5  Hip abduction 5 5  Hip adduction 5  5  Hip internal rotation    Hip external rotation    Knee flexion 4- 5  Knee extension 5 5  Ankle dorsiflexion 5 5  Ankle plantarflexion    Ankle inversion    Ankle eversion     (Blank rows = not tested)  LUMBAR SPECIAL TESTS:  Quadrant test: Positive and FABER test: Negative  FUNCTIONAL TESTS:  5 times sit to stand: 13.15 sec SLS < 2 sec B Stairs eccentric quad weakness Romberg 30 sec EO and EC   [facility] Adult PT Treatment:                                                DATE: 12/16/2023 Therapeutic Exercise: Prone press up Child's pose stretch Supine dynamic HS stretch --> sciatic nerve glide Supine modified piriformis stretch (Rt) Neuromuscular re-ed: Side Lying: Clamshell + RTB x10 Bent knee hip abd + RTB x10  Hip abduction & extension + RTB x10 Reverse clamshell + YTB x10 Isometric deadbug Dead bug progression Quadruped bird dog progression    TREATMENT DATE:                                                                                                                               12/11/23 Manual:  UPA mobs to B lumbar  TPR to L QL STM to B lumbar L>R TherEx: Prone press ups x 10 Prone press ups x10 with overpressure at various levels. L2/3 causes tingling in to buttock R to leg Relief with double knee to chest DKTC 2x10 Long sitting fig 4 stretch 2x30 sec B Standing R QL doorway stretch x 50 seconds Standing QL stretch with active plantar flexion of back foot x 10 (feels in R low back)  12/09/23 Manual: UPA mobs to B lumbar - pain at B L3/4, L2/3 Manual lumbar traction with ball and belt 3x20  Self traction techniques: sink and hanging from bed and matrix 90/90 alt leg press - tough 90/90 heel taps x 10 ea 90/90 Blue OH shoulder pull x 6  - do green next time Seated QL stretch x 30 sec and  Doorway QL stretch  12/04/23 DGI completed BERG completed HS stretch with strap 2 x 30 x sec R, x 1 min L Quad stretch with strap of Side of bed x 1 min  R - caused some tingling in her lower leg and foot (stick with 30 second hold). L x 30 sec Checked prone quad stretch - WNL Plank on knees x 30 sec Standard plank x 2 10 sec with cues for correct form Added SLS and tandem stance to HEP  11/26/23 See pt ed and HEP    PATIENT EDUCATION:  Education details: PT eval findings, anticipated POC, initial HEP, and role of DN  Person educated: Patient Education method: Explanation, Demonstration,  and Handouts Education comprehension: verbalized understanding and returned demonstration  HOME EXERCISE PROGRAM: Access Code: [email or web address] URL: [email or web address] Date: 12/11/2023 Prepared by: Concha Deed  Exercises - Supine Hamstring Stretch with Strap  - 2 x daily - 7 x weekly - 2 sets - 3 reps - 30 sec hold - Supine Quadriceps Stretch with Strap on Table  - 2 x daily - 7 x weekly - 1 sets - 3 reps - 30 sec hold - Seated Piriformis Stretch with Trunk Bend  - 2 x daily - 7 x weekly - 1 sets - 3 reps - 30-60 sec  hold - Single Leg Stance  - 1 x daily - 7 x weekly - 1 sets - 10 reps - max hold - Tandem Stance in Corner  - 1 x daily - 7 x weekly - 1 sets - 10 reps - max  hold - Step Taps on High Step  - 1 x daily - 7 x weekly - 2 sets - 10 reps - Standard Plank  - 2 x daily - 7 x weekly - 1 sets - 5 reps - max hold - Standing Lumbar Spine Flexion Stretch Counter  - 1 x daily - 3 x weekly - 2 sets - 10 reps - Standing Quadratus Lumborum Stretch with Doorway (Mirrored)  - 2 x daily - 7 x weekly - 1 sets - 2 reps - 30 sec hold - Supine 90/90 Alternating Heel Touches with Posterior Pelvic Tilt  - 1 x daily - 3 x weekly - 2 sets - 10 reps - Supine Figure 4 Piriformis Stretch  - 1 x daily - 7 x weekly - 1 sets - 3 reps - 30-60 sec hold - Supine Double Knee to Chest  - 2 x daily - 7 x weekly - 1  sets - 3 reps - 20 sec hold    ASSESSMENT:  CLINICAL IMPRESSION:  Session focused on progressing TA activation and abdominal strengthening. Tactile cues provided to improve pelvic stability and postural awareness during bird dog progression. Patient able to complete hip abduction variations in side lying with no aggravation of symptoms.    OBJECTIVE IMPAIRMENTS: Abnormal gait, decreased activity tolerance, decreased balance, decreased ROM, decreased strength, hypomobility, increased muscle spasms, impaired flexibility, postural dysfunction, and pain.   ACTIVITY LIMITATIONS: bending, squatting, sleeping, and stairs  PARTICIPATION LIMITATIONS: cleaning, community activity, and yard work  PERSONAL FACTORS: Age, Past/current experiences, Time since onset of injury/illness/exacerbation, and 1-2 comorbidities: LBP, DM  are also affecting patient's functional outcome.   REHAB POTENTIAL: Excellent  CLINICAL DECISION MAKING: Evolving/moderate complexity  EVALUATION COMPLEXITY: Moderate   GOALS: Goals reviewed with patient? Yes  SHORT TERM GOALS: Target date: 12/24/2023   Patient will be independent with initial HEP.  Baseline:  Goal status: IN PROGRESS  2.  Patient will report decreased buttock pain by 25% with ADLS and sleeping.  Baseline:  Goal status:IN PROGRESS  3.  Balance assessment completed and goals set at 2nd visit. Baseline:  Goal status: MET   LONG TERM GOALS: Target date: 01/21/2024   Patient will be independent with advanced/ongoing HEP to improve outcomes and carryover.  Baseline:  Goal status: INITIAL  2.  Patient will report >= 80% improvement in buttock pain with ADLs to improve QOL.  Baseline:  Goal status: INITIAL  3.  Patient able to stand from squatting position without LOB to improve safety with gardening.   Baseline:  Goal status: INITIAL  4.  Patient will report improvement of pain with sleeping  by 50% or more. Baseline:  Goal status:  INITIAL  5.  Patient will improve 4-5 points on the BERG to decrease fall risk.  Baseline: 51/56 Goal status: INITIAL  6.  Patient to score > 19 on DGI to decrease fall risk . Baseline: TBD Goal status: MET 12/04/23  23/24    PLAN:  PT FREQUENCY: 2x/week  PT DURATION: 8 weeks  PLANNED INTERVENTIONS: [id number]- PT Re-evaluation, 97110-Therapeutic exercises, 97530- Therapeutic activity, 97112- Neuromuscular re-education, 97535- Self Care, [id number]- Manual therapy, [phone]- Gait training, [phone]- Electrical stimulation (unattended), [phone]- Traction (mechanical), Patient/Family education, Balance training, Stair training, Taping, Dry Needling, Joint mobilization, Spinal mobilization, Cryotherapy, and Moist heat.  PLAN FOR NEXT SESSION:  DN lumbar left lateral quads, assess response to flexion biased exercise, focus on core and functional hip strength, eccentric quad strength, squat to stand    Sims Duck, PTA 12/16/2023, 10:15 AM

## 2023-12-17 NOTE — Therapy (Addendum)
 OUTPATIENT PHYSICAL THERAPY THORACOLUMBAR TREATMENT PHYSICAL THERAPY DISCHARGE SUMMARY  Visits from Start of Care: 6  Current functional level related to goals / functional outcomes: See progress note for discharge status    Remaining deficits: Unknown    Education / Equipment: HEP    Patient agrees to discharge. Patient goals were partially met. Patient is being discharged due to not returning since the last visit.  Celyn P. Ina PT, MPH 05/05/24 2:49 PM Mliss JINNY Cummins, PT    Patient Name: Ann Griffith MRN: [id number] DOB:18-Aug-1950, 73 y.o., female Today's Date: 12/18/2023  END OF SESSION:  PT End of Session - 12/18/23 0926     Visit Number 6    Date for PT Re-Evaluation 01/21/24    Authorization Type [employer] mcr    Progress Note Due on Visit 10    PT Start Time 0927    PT Stop Time 1000   patient had to leave early   PT Time Calculation (min) 33 min    Activity Tolerance Patient tolerated treatment well    Behavior During Therapy [facility] for tasks assessed/performed             Past Medical History:  Diagnosis Date   CHOLELITHIASIS, HX OF 11/09/2007   Qualifier: Diagnosis of  By: Claudene GUSS Railing     DIABETES MELLITUS, TYPE II 11/09/2007   Qualifier: Diagnosis of  By: Claudene GUSS Railing     Diverticulosis    Hyperlipidemia    Past Surgical History:  Procedure Laterality Date   bone spur removed  2003   left heel   CATARACT EXTRACTION Left 02/2016   CHOLECYSTECTOMY     COLONOSCOPY  08/2003   Patient Active Problem List   Diagnosis Date Noted   Need for RSV immunization 10/29/2023   Macrocytic anemia 08/12/2023   Elevated ferritin 08/12/2023   Hyponatremia 08/12/2023   Elevated MCV 08/12/2023   It band syndrome, left 12/27/2022   Thyroid  disorder screening 09/27/2022   Trouble in sleeping 06/26/2022   Type 2 diabetes mellitus with stage 3a chronic kidney disease, without long-term current use of insulin (HCC) 06/26/2022   Elevated vitamin B12  level 02/13/2022   Decreased GFR 02/13/2022   Mild cognitive impairment 01/14/2022   Irritable bowel syndrome with diarrhea 12/24/2021   Left lumbar radiculitis 07/31/2021   Gastroesophageal reflux disease without esophagitis 06/13/2021   Change in consistency of stool 06/13/2021   Squamous cell carcinoma in situ 03/19/2021   Stage 3a chronic kidney disease (HCC) 03/14/2021   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 03/13/2021   DDD (degenerative disc disease), lumbar 12/12/2020   Facet arthritis, degenerative, lumbar spine 06/28/2020   Acute left-sided low back pain with left-sided sciatica 06/28/2020   Iron deficiency anemia 06/13/2020   Myalgia 03/14/2020   Degenerative disc disease, cervical 12/07/2019   Toe pain, right 12/01/2019   Leg cramping 09/03/2019   Statin intolerance 07/03/2018   Itching 04/27/2018   Chronic pain of both ankles 04/27/2018   Bilateral carpal tunnel syndrome 04/27/2018   Raynaud's phenomenon without gangrene 02/03/2018   Mitral regurgitation 12/19/2017   Tricuspid regurgitation 12/19/2017   Orthostatic hypotension 12/16/2017   Syncope and collapse 12/16/2017   Essential hypertension 12/10/2017   Concussion 12/08/2017   Fear of flying 09/28/2017   Primary osteoarthritis of both knees 06/26/2017   Carpal tunnel syndrome, right 06/26/2017   Dry skin dermatitis 06/26/2017   Elevated serum creatinine 12/10/2016   Cataract cortical, senile, left 06/04/2016   Chronic right  shoulder pain 04/01/2016   RLS (restless legs syndrome) 03/29/2016   Insomnia 08/08/2015   Bilateral leg pain 08/08/2015   Shoulder bursitis 08/08/2015   Memory changes 05/05/2015   Seborrheic dermatitis 03/14/2015   Hyperlipidemia LDL goal <70 11/03/2014   BPPV (benign paroxysmal positional vertigo), right 11/02/2014   Microalbuminuria 11/02/2014   Raynaud's syndrome 08/09/2013   Diverticulosis 06/07/2013   Avitaminosis D 10/09/2012   Colon polyp 04/24/2011   Combined  hyperlipidemia associated with type 2 diabetes mellitus (HCC) 04/24/2011   Type 2 diabetes mellitus with complication, without long-term current use of insulin (HCC) 11/09/2007   SCHATZKI'S RING 11/09/2007   CHOLELITHIASIS, HX OF 11/09/2007    PCP: Antoniette Vermell CROME, PA-C   REFERRING PROVIDER: Carilyn Prentice BRAVO, MD   REFERRING DIAG: [phone] (ICD-10-CM) - Spondylosis without myelopathy or radiculopathy, lumbar region   Rationale for Evaluation and Treatment: Rehabilitation  THERAPY DIAG:  Other low back pain  Pain in right hip  Unsteadiness on feet  Cramp and spasm  ONSET DATE: 2 years  SUBJECTIVE:                                                                                                                                                                                           SUBJECTIVE STATEMENT: Just sore this morning. Getting an MRI 5/19. I'm cancelling my appointments until I get the MRI.  PERTINENT HISTORY:  DM, OA in knees/back  PAIN:  Are you having pain? Yes: NPRS scale: 0 LEFT LOW BACK Pain location: B gluteals R>L Pain description: pinch Aggravating factors: in the morning Relieving factors: nothing  PRECAUTIONS: None  RED FLAGS: None   WEIGHT BEARING RESTRICTIONS: No  FALLS:  Has patient fallen in last 6 months? No  LIVING ENVIRONMENT: Lives with: lives with their spouse Lives in: House/apartment Stairs: 3 porch steps - no issues Has following equipment at home: Single point cane  OCCUPATION: retired  PLOF: Independent and Leisure: yard work, travels a lot  PATIENT GOALS: stop my butt form hurting, improve my balance  NEXT MD VISIT: 01/21/24  OBJECTIVE:  Note: Objective measures were completed at Evaluation unless otherwise noted.  DIAGNOSTIC FINDINGS:  none  PATIENT SURVEYS:  Modified Oswestry 10 / 50 = 20.0 %   COGNITION: Overall cognitive status: Within functional limits for tasks assessed     SENSATION: WFL  MUSCLE  LENGTH: R piriformis, R hip flexor quad, R HS  POSTURE: forward head and decreased lumbar lordosis  PALPATION: Pain with UPA lumbar mobs L, tightness with UPA mobs R some pain in L1/2 B  LUMBAR ROM:   AROM eval  Flexion [facility]  Extension WFL  Right lateral flexion Full  Left lateral flexion Full * in R buttock  Right rotation 75%  Left rotation 75%   (Blank rows = not tested)  LOWER EXTREMITY ROM:   WNL  LOWER EXTREMITY MMT:    MMT Right eval Left eval  Hip flexion 4+ 5  Hip extension 5 5  Hip abduction 5 5  Hip adduction 5 5  Hip internal rotation    Hip external rotation    Knee flexion 4- 5  Knee extension 5 5  Ankle dorsiflexion 5 5  Ankle plantarflexion    Ankle inversion    Ankle eversion     (Blank rows = not tested)  LUMBAR SPECIAL TESTS:  Quadrant test: Positive and FABER test: Negative  FUNCTIONAL TESTS:  5 times sit to stand: 13.15 sec SLS < 2 sec B Stairs eccentric quad weakness Romberg 30 sec EO and EC     TREATMENT DATE:   12/18/23  Neuromuscular re-ed: SLS SLS with dot taps  [employer] 10# KB --> on foam semi tandem stance 6 inch step taps - difficult 8 inch step one foot on step --> head turns EO --> EC  Tandem stance ---> head turns, head nods, EC Step up with contralateral knee raise x 10 B HEP update  variations added to corner tandems stance, step up to knee raise   [facility] Adult PT Treatment:                                                DATE: 12/16/2023 Therapeutic Exercise: Prone press up Child's pose stretch Supine dynamic HS stretch --> sciatic nerve glide Supine modified piriformis stretch (Rt) Neuromuscular re-ed: Side Lying: Clamshell + RTB x10 Bent knee hip abd + RTB x10  Hip abduction & extension + RTB x10 Reverse clamshell + YTB x10 Isometric deadbug Dead bug progression Quadruped bird dog progression                                                                                                                                12/11/23 Manual:  UPA mobs to B lumbar  TPR to L QL STM to B lumbar L>R TherEx: Prone press ups x 10 Prone press ups x10 with overpressure at various levels. L2/3 causes tingling in to buttock R to leg Relief with double knee to chest DKTC 2x10 Long sitting fig 4 stretch 2x30 sec B Standing R QL doorway stretch x 50 seconds Standing QL stretch with active plantar flexion of back foot x 10 (feels in R low back)  12/09/23 Manual: UPA mobs to B lumbar - pain at B L3/4, L2/3 Manual lumbar traction with ball and belt 3x20  Self traction techniques: sink and hanging from bed and matrix 90/90 alt leg press - tough 90/90  heel taps x 10 ea 90/90 Blue OH shoulder pull x 6  - do green next time Seated QL stretch x 30 sec and Doorway QL stretch  12/04/23 DGI completed BERG completed HS stretch with strap 2 x 30 x sec R, x 1 min L Quad stretch with strap of Side of bed x 1 min  R - caused some tingling in her lower leg and foot (stick with 30 second hold). L x 30 sec Checked prone quad stretch - WNL Plank on knees x 30 sec Standard plank x 2 10 sec with cues for correct form Added SLS and tandem stance to HEP  11/26/23 See pt ed and HEP    PATIENT EDUCATION:  Education details: PT eval findings, anticipated POC, initial HEP, and role of DN  Person educated: Patient Education method: Explanation, Demonstration, and Handouts Education comprehension: verbalized understanding and returned demonstration  HOME EXERCISE PROGRAM: Access Code: [email or web address] URL: [email or web address] Date: 12/18/2023 Prepared by: Mliss  Exercises - Supine Hamstring Stretch with Strap  - 2 x daily - 7 x weekly - 2 sets - 3 reps - 30 sec hold - Supine Quadriceps Stretch with Strap on Table  - 2 x daily - 7 x weekly - 1 sets - 3 reps - 30 sec hold - Seated Piriformis Stretch with Trunk Bend  - 2 x daily - 7 x weekly - 1 sets - 3 reps - 30-60 sec  hold - Single Leg Stance  - 1 x  daily - 7 x weekly - 1 sets - 10 reps - max hold - Tandem Stance in Corner  - 1 x daily - 7 x weekly - 1 sets - 10 reps - max  hold - Step Taps on High Step  - 1 x daily - 7 x weekly - 2 sets - 10 reps - Standard Plank  - 2 x daily - 7 x weekly - 1 sets - 5 reps - max hold - Standing Lumbar Spine Flexion Stretch Counter  - 1 x daily - 3 x weekly - 2 sets - 10 reps - Standing Quadratus Lumborum Stretch with Doorway (Mirrored)  - 2 x daily - 7 x weekly - 1 sets - 2 reps - 30 sec hold - Supine 90/90 Alternating Heel Touches with Posterior Pelvic Tilt  - 1 x daily - 3 x weekly - 2 sets - 10 reps - Supine Figure 4 Piriformis Stretch  - 1 x daily - 7 x weekly - 1 sets - 3 reps - 30-60 sec hold - Supine Double Knee to Chest  - 2 x daily - 7 x weekly - 1 sets - 3 reps - 20 sec hold - Runner's Step Up/Down  - 1 x daily - 3 x weekly - 2 sets - 10 reps    ASSESSMENT:  CLINICAL IMPRESSION:  Ann Griffith presents today with no back pain. She is getting an MRI on 5/19 and is cancelling her appointments until she gets her results. She also states that she needs to leave at 10:00. We focused on balance today. She has met her LTG of squatting to stand without LOB. She demonstrated improved SLS to over 5 sec today, but she still experiences LOB with step taps, tandem stance and semi- tandem stance with head movements. She also has significant LOB when her eyes are closed. HEP updated with many of these items. Plan to place patient on hold pending MRI results. She will call to update.   OBJECTIVE IMPAIRMENTS:  Abnormal gait, decreased activity tolerance, decreased balance, decreased ROM, decreased strength, hypomobility, increased muscle spasms, impaired flexibility, postural dysfunction, and pain.   ACTIVITY LIMITATIONS: bending, squatting, sleeping, and stairs  PARTICIPATION LIMITATIONS: cleaning, community activity, and yard work  PERSONAL FACTORS: Age, Past/current experiences, Time since onset of  injury/illness/exacerbation, and 1-2 comorbidities: LBP, DM are also affecting patient's functional outcome.   REHAB POTENTIAL: Excellent  CLINICAL DECISION MAKING: Evolving/moderate complexity  EVALUATION COMPLEXITY: Moderate   GOALS: Goals reviewed with patient? Yes  SHORT TERM GOALS: Target date: 12/24/2023   Patient will be independent with initial HEP.  Baseline:  Goal status: IN PROGRESS  2.  Patient will report decreased buttock pain by 25% with ADLS and sleeping.  Baseline:  Goal status:IN PROGRESS  3.  Balance assessment completed and goals set at 2nd visit. Baseline:  Goal status: MET   LONG TERM GOALS: Target date: 01/21/2024   Patient will be independent with advanced/ongoing HEP to improve outcomes and carryover.  Baseline:  Goal status: INITIAL  2.  Patient will report >= 80% improvement in buttock pain with ADLs to improve QOL.  Baseline:  Goal status: INITIAL  3.  Patient able to stand from squatting position without LOB to improve safety with gardening.   Baseline:  Goal status: MET 12/18/23  4.  Patient will report improvement of pain with sleeping by 50% or more. Baseline:  Goal status: INITIAL  5.  Patient will improve 4-5 points on the BERG to decrease fall risk.  Baseline: 51/56 Goal status: INITIAL  6.  Patient to score > 19 on DGI to decrease fall risk . Baseline: TBD Goal status: MET 12/04/23  23/24    PLAN:  PT FREQUENCY: 2x/week  PT DURATION: 8 weeks  PLANNED INTERVENTIONS: 97164- PT Re-evaluation, 97110-Therapeutic exercises, 97530- Therapeutic activity, 97112- Neuromuscular re-education, 97535- Self Care, 02859- Manual therapy, [phone]- Gait training, [phone]- Electrical stimulation (unattended), [phone]- Traction (mechanical), Patient/Family education, Balance training, Stair training, Taping, Dry Needling, Joint mobilization, Spinal mobilization, Cryotherapy, and Moist heat.  PLAN FOR NEXT SESSION:  on hold until MRI 12/29/23. Pt will  call to update.   Mliss Cummins, PT  12/18/2023, 10:10 AM

## 2023-12-18 ENCOUNTER — Ambulatory Visit: Admitting: Physical Therapy

## 2023-12-18 ENCOUNTER — Encounter: Payer: Self-pay | Admitting: Physical Therapy

## 2023-12-18 DIAGNOSIS — M5459 Other low back pain: Secondary | ICD-10-CM

## 2023-12-18 DIAGNOSIS — M25551 Pain in right hip: Secondary | ICD-10-CM | POA: Diagnosis not present

## 2023-12-18 DIAGNOSIS — R2681 Unsteadiness on feet: Secondary | ICD-10-CM

## 2023-12-18 DIAGNOSIS — R252 Cramp and spasm: Secondary | ICD-10-CM | POA: Diagnosis not present

## 2023-12-23 ENCOUNTER — Ambulatory Visit: Admitting: Physical Therapy

## 2023-12-25 ENCOUNTER — Encounter: Admitting: Physical Therapy

## 2023-12-29 ENCOUNTER — Ambulatory Visit

## 2023-12-29 DIAGNOSIS — M5416 Radiculopathy, lumbar region: Secondary | ICD-10-CM | POA: Diagnosis not present

## 2023-12-29 DIAGNOSIS — M48061 Spinal stenosis, lumbar region without neurogenic claudication: Secondary | ICD-10-CM | POA: Diagnosis not present

## 2023-12-29 DIAGNOSIS — M4727 Other spondylosis with radiculopathy, lumbosacral region: Secondary | ICD-10-CM | POA: Diagnosis not present

## 2023-12-29 DIAGNOSIS — M5117 Intervertebral disc disorders with radiculopathy, lumbosacral region: Secondary | ICD-10-CM | POA: Diagnosis not present

## 2023-12-29 DIAGNOSIS — M4807 Spinal stenosis, lumbosacral region: Secondary | ICD-10-CM | POA: Diagnosis not present

## 2023-12-29 MED ORDER — GADOBUTROL 1 MMOL/ML IV SOLN
7.0000 mL | Freq: Once | INTRAVENOUS | Status: AC | PRN
  Administered 2023-12-29: 7 mL via INTRAVENOUS

## 2024-01-12 DIAGNOSIS — H1012 Acute atopic conjunctivitis, left eye: Secondary | ICD-10-CM | POA: Diagnosis not present

## 2024-01-12 DIAGNOSIS — D23122 Other benign neoplasm of skin of left lower eyelid, including canthus: Secondary | ICD-10-CM | POA: Diagnosis not present

## 2024-01-12 DIAGNOSIS — H401422 Capsular glaucoma with pseudoexfoliation of lens, left eye, moderate stage: Secondary | ICD-10-CM | POA: Diagnosis not present

## 2024-01-16 ENCOUNTER — Other Ambulatory Visit: Payer: Self-pay | Admitting: Physician Assistant

## 2024-01-16 DIAGNOSIS — I10 Essential (primary) hypertension: Secondary | ICD-10-CM

## 2024-01-23 ENCOUNTER — Other Ambulatory Visit: Payer: Self-pay | Admitting: Physician Assistant

## 2024-01-23 DIAGNOSIS — L853 Xerosis cutis: Secondary | ICD-10-CM

## 2024-01-27 ENCOUNTER — Encounter: Payer: Self-pay | Admitting: Physician Assistant

## 2024-01-27 ENCOUNTER — Ambulatory Visit (INDEPENDENT_AMBULATORY_CARE_PROVIDER_SITE_OTHER): Admitting: Physician Assistant

## 2024-01-27 VITALS — BP 150/70 | HR 60 | Ht 62.0 in | Wt 154.0 lb

## 2024-01-27 DIAGNOSIS — N1831 Chronic kidney disease, stage 3a: Secondary | ICD-10-CM

## 2024-01-27 DIAGNOSIS — E785 Hyperlipidemia, unspecified: Secondary | ICD-10-CM | POA: Diagnosis not present

## 2024-01-27 DIAGNOSIS — D539 Nutritional anemia, unspecified: Secondary | ICD-10-CM

## 2024-01-27 DIAGNOSIS — R7989 Other specified abnormal findings of blood chemistry: Secondary | ICD-10-CM

## 2024-01-27 DIAGNOSIS — G2581 Restless legs syndrome: Secondary | ICD-10-CM

## 2024-01-27 DIAGNOSIS — G3184 Mild cognitive impairment, so stated: Secondary | ICD-10-CM

## 2024-01-27 DIAGNOSIS — I10 Essential (primary) hypertension: Secondary | ICD-10-CM

## 2024-01-27 DIAGNOSIS — H01119 Allergic dermatitis of unspecified eye, unspecified eyelid: Secondary | ICD-10-CM

## 2024-01-27 DIAGNOSIS — Z7984 Long term (current) use of oral hypoglycemic drugs: Secondary | ICD-10-CM

## 2024-01-27 DIAGNOSIS — E1122 Type 2 diabetes mellitus with diabetic chronic kidney disease: Secondary | ICD-10-CM | POA: Diagnosis not present

## 2024-01-27 LAB — POCT GLYCOSYLATED HEMOGLOBIN (HGB A1C): Hemoglobin A1C: 5.8 % — AB (ref 4.0–5.6)

## 2024-01-27 MED ORDER — LISINOPRIL 20 MG PO TABS: 20.0000 mg | ORAL_TABLET | Freq: Every day | ORAL | 1 refills | Status: DC

## 2024-01-27 MED ORDER — HYDROCHLOROTHIAZIDE 12.5 MG PO TABS: 12.5000 mg | ORAL_TABLET | Freq: Every day | ORAL | 3 refills | Status: AC

## 2024-01-27 MED ORDER — METFORMIN HCL 500 MG PO TABS: 500.0000 mg | ORAL_TABLET | Freq: Every day | ORAL | 1 refills | Status: DC

## 2024-01-27 NOTE — Patient Instructions (Addendum)
 Hydrocortisone with aquafor over inflamed part of eye twice daily as needed.  Lisinopril  20mg   Metformin  500mg 

## 2024-01-27 NOTE — Progress Notes (Signed)
 Established Patient Office Visit  Subjective   Patient ID: Ann Griffith, female    DOB: 05/07/1951  Age: 73 y.o. MRN: [id number]  Chief Complaint  Patient presents with   Medical Management of Chronic Issues    Last A1c 5.6, pt would like to discuss left eye, med changes would like to go down on lisinopril  and metformin     HPI Pt is a 73 yo female with T2DM, HTN, HLD, RLS, CkD-III who presents to the clinic for follow up and medication refills.   Pt is not checking her sugars. Her last A1c was 5.6. she decreased to metformin  500mg  once with breakfast. She is tolerating well. She denies any CP, palpitations, headaches or vision changes. She is checking BP at home and running under 130/80.   RLS controlled on requip .   She would like to be tested for the gene for alzheimers. She continues to be concerned about her memory. She has seen neurology in the past and never dx with dementia. She has forgotten her way home twice and that really concerned her.   She does have some itchy red dry skin over her left upper and lower eyelid. She has not tried anything but vasoline to make better. She has started using different eye drops for her glaucoma.      ROS See HPI.    Objective:     BP (!) 150/70   Pulse 60   Ht 5' 2 (1.575 m)   Wt 154 lb (69.9 kg)   LMP 08/12/2000   SpO2 99%   BMI 28.17 kg/m  BP Readings from Last 3 Encounters:  01/27/24 (!) 150/70  12/12/23 106/69  10/31/23 136/77   Wt Readings from Last 3 Encounters:  01/27/24 154 lb (69.9 kg)  12/12/23 150 lb 12.8 oz (68.4 kg)  10/31/23 149 lb 9.6 oz (67.9 kg)    .Aaron Aas    01/27/2024    9:46 AM 01/14/2022    2:11 PM  Montreal Cognitive Assessment   Visuospatial/ Executive (0/5) 4 5  Naming (0/3) 3 3  Attention: Read list of digits (0/2) 2 0  Attention: Read list of letters (0/1) 1 1  Attention: Serial 7 subtraction starting at 100 (0/3) 3 1  Language: Repeat phrase (0/2) 2 2  Language : Fluency (0/1) 1 1   Abstraction (0/2) 2 2  Delayed Recall (0/5) 2 3  Orientation (0/6) 6 6  Total 26 24  Adjusted Score (based on education) 27      Physical Exam Constitutional:      Appearance: Normal appearance.  HENT:     Head: Normocephalic.   Cardiovascular:     Rate and Rhythm: Normal rate and regular rhythm.     Pulses: Normal pulses.     Heart sounds: Murmur heard.  Pulmonary:     Effort: Pulmonary effort is normal.     Breath sounds: Normal breath sounds.   Neurological:     General: No focal deficit present.     Mental Status: She is alert and oriented to person, place, and time.   Psychiatric:        Mood and Affect: Mood normal.      Results for orders placed or performed in visit on 01/27/24  CMP14+EGFR  Result Value Ref Range   Glucose 126 (H) 70 - 99 mg/dL   BUN 19 8 - 27 mg/dL   Creatinine, Ser 1.19 0.57 - 1.00 mg/dL   eGFR 73 >14 NW/GNF/6.21   BUN/Creatinine Ratio  23 12 - 28   Sodium 133 (L) 134 - 144 mmol/L   Potassium 5.1 3.5 - 5.2 mmol/L   Chloride 99 96 - 106 mmol/L   CO2 20 20 - 29 mmol/L   Calcium  9.8 8.7 - 10.3 mg/dL   Total Protein 6.8 6.0 - 8.5 g/dL   Albumin 4.4 3.8 - 4.8 g/dL   Globulin, Total 2.4 1.5 - 4.5 g/dL   Bilirubin Total 0.3 0.0 - 1.2 mg/dL   Alkaline Phosphatase 45 44 - 121 IU/L   AST 19 0 - 40 IU/L   ALT 17 0 - 32 IU/L  APOE Alzheimer's Risk  Result Value Ref Range   Methodology: WILL FOLLOW    APO E Genotyping Result: WILL FOLLOW    Interpretation: WILL FOLLOW    Comment: WILL FOLLOW   B12 and Folate Panel  Result Value Ref Range   Vitamin B-12 1,030 232 - 1,245 pg/mL   Folate 14.4 >3.0 ng/mL  CBC w/Diff/Platelet  Result Value Ref Range   WBC 5.1 3.4 - 10.8 x10E3/uL   RBC 3.28 (L) 3.77 - 5.28 x10E6/uL   Hemoglobin 10.9 (L) 11.1 - 15.9 g/dL   Hematocrit 96.0 (L) 45.4 - 46.6 %   MCV 101 (H) 79 - 97 fL   MCH 33.2 (H) 26.6 - 33.0 pg   MCHC 33.0 31.5 - 35.7 g/dL   RDW 09.8 11.9 - 14.7 %   Platelets 234 150 - 450 x10E3/uL    Neutrophils 61 Not Estab. %   Lymphs 25 Not Estab. %   Monocytes 7 Not Estab. %   Eos 6 Not Estab. %   Basos 1 Not Estab. %   Neutrophils Absolute 3.1 1.4 - 7.0 x10E3/uL   Lymphocytes Absolute 1.2 0.7 - 3.1 x10E3/uL   Monocytes Absolute 0.4 0.1 - 0.9 x10E3/uL   EOS (ABSOLUTE) 0.3 0.0 - 0.4 x10E3/uL   Basophils Absolute 0.0 0.0 - 0.2 x10E3/uL   Immature Granulocytes 0 Not Estab. %   Immature Grans (Abs) 0.0 0.0 - 0.1 x10E3/uL  Fe+TIBC+Fer  Result Value Ref Range   Total Iron Binding Capacity 293 250 - 450 ug/dL   UIBC 829 562 - 130 ug/dL   Iron 865 27 - 784 ug/dL   Iron Saturation 41 15 - 55 %   Ferritin 102 15 - 150 ng/mL  POCT HgB A1C  Result Value Ref Range   Hemoglobin A1C 5.8 (A) 4.0 - 5.6 %   HbA1c POC (<> result, manual entry)     HbA1c, POC (prediabetic range)     HbA1c, POC (controlled diabetic range)      The 10-year ASCVD risk score (Arnett DK, et al., 2019) is: 43.4%    Assessment & Plan:  Aaron AasAaron AasSherrie was seen today for medical management of chronic issues.  Diagnoses and all orders for this visit:  Type 2 diabetes mellitus with stage 3a chronic kidney disease, without long-term current use of insulin (HCC) -     POCT HgB A1C -     CMP14+EGFR -     metFORMIN  (GLUCOPHAGE ) 500 MG tablet; Take 1 tablet (500 mg total) by mouth daily with breakfast.  RLS (restless legs syndrome)  Hyperlipidemia LDL goal <70  Essential hypertension -     lisinopril  (ZESTRIL ) 20 MG tablet; Take 1 tablet (20 mg total) by mouth daily. -     hydrochlorothiazide  (HYDRODIURIL ) 12.5 MG tablet; Take 1 tablet (12.5 mg total) by mouth daily.  Macrocytic anemia -     CBC  w/Diff/Platelet -     Fe+TIBC+Fer  Elevated ferritin -     CBC w/Diff/Platelet -     Fe+TIBC+Fer  Mild cognitive impairment -     APOE Alzheimer's Risk -     B12 and Folate Panel  Elevated vitamin B12 level -     B12 and Folate Panel  Eyelid dermatitis, allergic/contact   A1C to goal Continue on same  metformin  dose Continue with DM diet and regular exercise BP not to goal today but patient checking at home and to goal, refilled lisinopril .  On statin Eye and foot exam UTD. Declined covid vaccine. Follow up in 3 months  MOCA improved which is great news Rechecked b12/folate/vitamin D  Ordered alzheimer gene  Discussed eyelid eczema Trial of hydrocortisone and aquafor if not improving will consider a stronger topical steroid such as tramcinolone.    Return in about 3 months (around 04/28/2024).    Dawsyn Zurn, PA-C

## 2024-01-28 ENCOUNTER — Ambulatory Visit: Payer: Self-pay | Admitting: Physician Assistant

## 2024-01-28 LAB — APOE ALZHEIMER'S RISK

## 2024-01-28 NOTE — Progress Notes (Signed)
 Ann Griffith,   Still waiting for the alzheimer gene.   Kidney function is BETTER. This is GREAT news.  Your hemoglobin dropped a little but your iron stores and serum iron look great.  B12 and folate look GREAT!

## 2024-02-05 ENCOUNTER — Other Ambulatory Visit: Payer: Self-pay | Admitting: Physician Assistant

## 2024-02-05 DIAGNOSIS — E1122 Type 2 diabetes mellitus with diabetic chronic kidney disease: Secondary | ICD-10-CM

## 2024-02-06 LAB — IRON,TIBC AND FERRITIN PANEL
Ferritin: 102 ng/mL (ref 15–150)
Iron Saturation: 41 % (ref 15–55)
Iron: 119 ug/dL (ref 27–139)
Total Iron Binding Capacity: 293 ug/dL (ref 250–450)
UIBC: 174 ug/dL (ref 118–369)

## 2024-02-06 LAB — CMP14+EGFR
ALT: 17 IU/L (ref 0–32)
AST: 19 IU/L (ref 0–40)
Albumin: 4.4 g/dL (ref 3.8–4.8)
Alkaline Phosphatase: 45 IU/L (ref 44–121)
BUN/Creatinine Ratio: 23 (ref 12–28)
BUN: 19 mg/dL (ref 8–27)
Bilirubin Total: 0.3 mg/dL (ref 0.0–1.2)
CO2: 20 mmol/L (ref 20–29)
Calcium: 9.8 mg/dL (ref 8.7–10.3)
Chloride: 99 mmol/L (ref 96–106)
Creatinine, Ser: 0.84 mg/dL (ref 0.57–1.00)
Globulin, Total: 2.4 g/dL (ref 1.5–4.5)
Glucose: 126 mg/dL — ABNORMAL HIGH (ref 70–99)
Potassium: 5.1 mmol/L (ref 3.5–5.2)
Sodium: 133 mmol/L — ABNORMAL LOW (ref 134–144)
Total Protein: 6.8 g/dL (ref 6.0–8.5)
eGFR: 73 mL/min/{1.73_m2} (ref >59)

## 2024-02-06 LAB — CBC WITH DIFFERENTIAL/PLATELET
Basophils Absolute: 0 10*3/uL (ref 0.0–0.2)
Basos: 1 %
EOS (ABSOLUTE): 0.3 10*3/uL (ref 0.0–0.4)
Eos: 6 %
Hematocrit: 33 % — ABNORMAL LOW (ref 34.0–46.6)
Hemoglobin: 10.9 g/dL — ABNORMAL LOW (ref 11.1–15.9)
Immature Grans (Abs): 0 10*3/uL (ref 0.0–0.1)
Immature Granulocytes: 0 %
Lymphocytes Absolute: 1.2 10*3/uL (ref 0.7–3.1)
Lymphs: 25 %
MCH: 33.2 pg — ABNORMAL HIGH (ref 26.6–33.0)
MCHC: 33 g/dL (ref 31.5–35.7)
MCV: 101 fL — ABNORMAL HIGH (ref 79–97)
Monocytes Absolute: 0.4 10*3/uL (ref 0.1–0.9)
Monocytes: 7 %
Neutrophils Absolute: 3.1 10*3/uL (ref 1.4–7.0)
Neutrophils: 61 %
Platelets: 234 10*3/uL (ref 150–450)
RBC: 3.28 x10E6/uL — ABNORMAL LOW (ref 3.77–5.28)
RDW: 11.9 % (ref 11.7–15.4)
WBC: 5.1 10*3/uL (ref 3.4–10.8)

## 2024-02-06 LAB — B12 AND FOLATE PANEL
Folate: 14.4 ng/mL (ref >3.0)
Vitamin B-12: 1030 pg/mL (ref 232–1245)

## 2024-02-06 NOTE — Progress Notes (Signed)
 Good news,Negative for the APOE4 variant that is associated with  increased risk for late onset Alzheimer's disease (AD).  Just wanted to let you know since Vermell is out of the office this week did not want you to wait to hear the good news.

## 2024-02-11 ENCOUNTER — Ambulatory Visit: Attending: Family Medicine

## 2024-02-11 ENCOUNTER — Ambulatory Visit

## 2024-02-11 ENCOUNTER — Ambulatory Visit: Admitting: Family Medicine

## 2024-02-11 ENCOUNTER — Encounter: Payer: Self-pay | Admitting: Family Medicine

## 2024-02-11 ENCOUNTER — Ambulatory Visit: Payer: Self-pay | Admitting: Family Medicine

## 2024-02-11 VITALS — BP 149/66 | HR 68 | Ht 62.0 in | Wt 155.0 lb

## 2024-02-11 DIAGNOSIS — S299XXA Unspecified injury of thorax, initial encounter: Secondary | ICD-10-CM | POA: Diagnosis not present

## 2024-02-11 DIAGNOSIS — R55 Syncope and collapse: Secondary | ICD-10-CM | POA: Diagnosis not present

## 2024-02-11 DIAGNOSIS — R0781 Pleurodynia: Secondary | ICD-10-CM | POA: Diagnosis not present

## 2024-02-11 MED ORDER — TRAMADOL HCL 50 MG PO TABS: 50.0000 mg | ORAL_TABLET | Freq: Three times a day (TID) | ORAL | 0 refills | Status: AC | PRN

## 2024-02-11 NOTE — Progress Notes (Unsigned)
 EP to read.

## 2024-02-11 NOTE — Progress Notes (Signed)
 Hi Ann Griffith, they did see a fracture in the 5th rib.  So this can take 8-12 weeks to heal.  No other acute findings.  We will stick with current plan.

## 2024-02-11 NOTE — Progress Notes (Signed)
 Acute Office Visit  Subjective:     Patient ID: Ann Griffith, female    DOB: Aug 25, 1950, 73 y.o.   MRN: [id number]  Chief Complaint  Patient presents with   Fall   Dizziness    HPI Patient is in today for dizziness. Started last night when she got up to go to restroom.  She made it to the bathroom okay and was sitting on the toilet when she suddenly felt very nauseated to the point that she actually put her head between her knees.  She said she sat there for quite a while until she felt a little better.  And then she got up to head back to bed and that is when she got close to the edge of the bed and actually fell forward because she felt like she was passing out.  She actually hit her right upper anterior chest on the ball shaped finial on the edge of her bed she is worried she may have broken a rib.  It hurts now to cough or take a deep breath.  She did ice it.  She is actually had a couple of episodes of syncope and near syncope the first time it happened was back in 2017.  Then it happened again in 2019 where she actually had a head injury and ended up hospitalized for couple of days she is actually had a couple of episodes of syncope and near syncope the first time it happened was back in 2017.  Then it happened again in 2019 where she actually had a head injury and ended up hospitalized for couple of days.  In the third time was about a month ago.  She was leaving a doctor's office and crossing over requires walk and felt like she was going to pass out she was able to grab hold of a pilon and just stand there for a while until she could finally get to her car.  And then again last night.  She says normally her blood pressures all over the place when she checks it at home it can range from the 120s up to 170s sometimes in the middle in the 140s.  She had been doing some yard work earlier in the week but not yesterday.  ROS      Objective:    BP (!) 149/66   Pulse 68   Ht 5' 2  (1.575 m)   Wt 155 lb 0.6 oz (70.3 kg)   LMP 08/12/2000   SpO2 95%   BMI 28.36 kg/m    Physical Exam Vitals and nursing note reviewed.  Constitutional:      Appearance: Normal appearance.  HENT:     Head: Normocephalic and atraumatic.  Eyes:     Conjunctiva/sclera: Conjunctivae normal.  Cardiovascular:     Rate and Rhythm: Normal rate and regular rhythm.  Pulmonary:     Effort: Pulmonary effort is normal.     Breath sounds: Normal breath sounds.  Skin:    General: Skin is warm and dry.  Neurological:     Mental Status: She is alert.  Psychiatric:        Mood and Affect: Mood normal.     No results found for any visits on 02/11/24.      Assessment & Plan:   Problem List Items Addressed This Visit   None Visit Diagnoses       Rib injury    -  Primary   Relevant Orders   DG Ribs Unilateral  W/Chest Right     Near syncope       Relevant Orders   ECHOCARDIOGRAM COMPLETE   LONG TERM MONITOR XT (3-14 DAYS)      Rib injury - from fall secondary to near syncope.  Will get rib films today.  She can use Tylenol  and if she needs something stronger I did send over prescription for tramadol  for her to try.  We did discuss that if she has syncope or near syncope again or even just feels lightheaded I really want her to check her blood pressure and check a glucose level and let us  know.  Her last A1c was under 6 and she is on metformin  so she might be a candidate to actually stop that medication her husband says she does not eat well.  She says that she tends to graze so she is not skipping meals.  We also discussed further workup for near syncope including an echocardiogram and cardiac monitor she is okay with proceeding forward.  Meds ordered this encounter  Medications   traMADol  (ULTRAM ) 50 MG tablet    Sig: Take 1 tablet (50 mg total) by mouth every 8 (eight) hours as needed for up to 5 days.    Dispense:  15 tablet    Refill:  0    No follow-ups on  file.  Dorothyann Byars, MD

## 2024-02-12 ENCOUNTER — Ambulatory Visit (HOSPITAL_BASED_OUTPATIENT_CLINIC_OR_DEPARTMENT_OTHER)
Admission: RE | Admit: 2024-02-12 | Discharge: 2024-02-12 | Disposition: A | Discharge status: Home / Self Care | Source: Ambulatory Visit | Attending: Family Medicine | Admitting: Family Medicine

## 2024-02-12 DIAGNOSIS — I5030 Unspecified diastolic (congestive) heart failure: Secondary | ICD-10-CM

## 2024-02-12 DIAGNOSIS — R55 Syncope and collapse: Secondary | ICD-10-CM

## 2024-02-13 LAB — ECHOCARDIOGRAM COMPLETE
AR max vel: 2.74 cm2
AV Area VTI: 2.71 cm2
AV Area mean vel: 2.87 cm2
AV Mean grad: 3 mmHg
AV Peak grad: 6.2 mmHg
Ao pk vel: 1.24 m/s
Area-P 1/2: 3.23 cm2
Calc EF: 64.8 %
S' Lateral: 2.9 cm
Single Plane A2C EF: 64.1 %
Single Plane A4C EF: 63.5 %

## 2024-02-15 NOTE — Progress Notes (Signed)
 Hi Ann Griffith, I am covering for [place] while she is out of the office.  The pumping function of the heart looks great at 6065% which is normal.  The walls are moving normally.  There is just a slight irregularity in the relaxation of the heart.  But nothing causing major impairment or problems.  Mitral valve looks good.  The aortic valve also looks good.  Overall heart ultrasound looks very normal and reassuring.

## 2024-02-20 ENCOUNTER — Encounter: Admitting: Physical Medicine & Rehabilitation

## 2024-03-02 ENCOUNTER — Encounter: Attending: Physical Medicine & Rehabilitation | Admitting: Physical Medicine & Rehabilitation

## 2024-03-02 ENCOUNTER — Encounter: Payer: Self-pay | Admitting: Physical Medicine & Rehabilitation

## 2024-03-02 VITALS — BP 158/84 | HR 69 | Ht 62.0 in | Wt 156.8 lb

## 2024-03-02 DIAGNOSIS — M48062 Spinal stenosis, lumbar region with neurogenic claudication: Secondary | ICD-10-CM | POA: Diagnosis not present

## 2024-03-02 DIAGNOSIS — M47816 Spondylosis without myelopathy or radiculopathy, lumbar region: Secondary | ICD-10-CM | POA: Insufficient documentation

## 2024-03-02 NOTE — Progress Notes (Signed)
 Subjective:  10/31/23 Patient referred by primary care with complaints of bilateral leg pain at night.  Chart review indicates that patient has had some similar symptoms since at least 04/06/2019 which she underwent bilateral knee films pelvic films and lumbar x-rays.  The x-rays showed minimal loss of joint space at medial compartment, pelvic films were essentially unremarkable other than showing some L5-S1 facet osteoarthritis which was confirmed on lumbar x-rays. Patient has a history of diabetes but control has been very good over the last year, hemoglobin A1c has been less than 6.  12/12/23 till with severe pinching pain in Right buttocks, tingling goes into the right foot intermittently .  Symptoms worse first thing in am .  She cannot tell me whether it is any particular toe or the top or bottom of her foot that bother her the most.  Patient has had 4 therapy sessions  Patient ID: Ann Griffith, female    DOB: Jul 31, 1951, 73 y.o.   MRN: [id number]  HPI  Continues with moderate pain in bilateral buttocks area.  It seems a little worse on the right side.  She has had no new falls or trauma. Tingling and numbness to the right ankle but this is only occasional and not a primary concern. She would like to go over the MRI of the lumbar spine which we did today using a spine model to explain the findings. MRI LUMBAR SPINE WITHOUT AND WITH CONTRAST   TECHNIQUE: Multiplanar and multiecho pulse sequences of the lumbar spine were obtained without and with intravenous contrast.   CONTRAST:  7mL GADAVIST  GADOBUTROL  1 MMOL/ML IV SOLN   COMPARISON:  Lumbar spine series dated April 06, 2019.   FINDINGS: Segmentation:  Standard.   Alignment:  Slight degenerative anterolisthesis at L4-5.   Vertebrae: Normal in height and signal intensity, excepting for discogenic reactive changes within the endplates anteriorly at L2-3.   Conus medullaris and cauda equina: Conus extends to the T12-L1 level.  Conus and cauda equina appear normal. There is no abnormal enhancement of the spinal cord or nerve roots of the cauda carina.   Paraspinal and other soft tissues: Unremarkable.   Disc levels:   L1-2: Left paracentral craniad disc extrusion, which in dense of ventral thecal sac and causes mild central spinal canal stenosis. The neural foramina are widely patent.   L2-3: Broad-based disc bulging with mild central spinal canal stenosis and mild-to-moderate bilateral lateral recess stenosis, but no apparent nerve root impingement.   L3-4: Broad-based disc bulging and mild bilateral facet hypertrophy, resulting in moderate central spinal canal stenosis and mild-to-moderate bilateral lateral recess stenosis. No apparent nerve root impingement.   L4-5: Slight degenerative anterolisthesis, mild disc bulging and moderate bilateral facet arthrosis, with moderate central spinal canal stenosis and mild-to-moderate bilateral lateral recess and neural foraminal stenosis. No apparent nerve root impingement.   L5-S1: Mild diffuse disc bulging and moderate bilateral facet hypertrophy, with moderate to severe bilateral neural foraminal stenosis with probable impingement of the L5 nerves in the neural foraminal bilaterally. There is also focal enhancement present within the left lateral recess seen on image 10 of series 11, likely related to facet arthropathy.   IMPRESSION: 1. 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 There is also enhancement within the left lateral recess likely related to facet arthrosis which could be affecting the left S1 nerve. 2. Degenerative anterolisthesis  at L4-5 with mild to moderate bilateral lateral recess and neural foraminal stenosis. 3. Degenerative disc disease L1-2, L2-3 and L3-4, with varying spinal  canal and lateral recess stenosis, but no apparent nerve root impingement.     Electronically Signed   By: Evalene Coho M.D.   On: 02/04/2024 09:21   Pain Inventory Average Pain 7 Pain Right Now 7 My pain is constant and aching  In the last 24 hours, has pain interfered with the following? General activity 6 Relation with others 0 Enjoyment of life 7 What TIME of day is your pain at its worst? morning  and night Sleep (in general) Fair  Pain is worse with: sitting and standing Pain improves with: heat/ice and pacing activities Relief from Meds: na  Family History  Problem Relation Age of Onset   Diabetes Mother    Social History   Socioeconomic History   Marital status: Married    Spouse name: Arley   Number of children: 1   Years of education: 12   Highest education level: High school graduate  Occupational History   Occupation: data entry Solicitor in receiving    Comment: Retired  Tobacco Use   Smoking status: Never   Smokeless tobacco: Never  Vaping Use   Vaping status: Never Used  Substance and Sexual Activity   Alcohol use: Yes    Alcohol/week: 0.0 - 1.0 standard drinks of alcohol    Comment: occasionally   Drug use: No   Sexual activity: Yes    Partners: Male    Birth control/protection: Post-menopausal  Other Topics Concern   Not on file  Social History Narrative   Lives with her husband. Likes to go to movies and travel.    Social Drivers of Corporate investment banker Strain: Low Risk  (04/09/2023)   Overall Financial Resource Strain (CARDIA)    Difficulty of Paying Living Expenses: Not hard at all  Food Insecurity: No Food Insecurity (04/09/2023)   Hunger Vital Sign    Worried About Running Out of Food in the Last Year: Never true    Ran Out of Food in the Last Year: Never true  Transportation Needs: No Transportation Needs (04/09/2023)   PRAPARE - Administrator, Civil Service (Medical): No    Lack of Transportation  (Non-Medical): No  Physical Activity: Sufficiently Active (04/09/2023)   Exercise Vital Sign    Days of Exercise per Week: 3 days    Minutes of Exercise per Session: 60 min  Stress: No Stress Concern Present (04/09/2023)   [employer] of Occupational Health - Occupational Stress Questionnaire    Feeling of Stress : Not at all  Social Connections: Moderately Isolated (04/09/2023)   Social Connection and Isolation Panel    Frequency of Communication with Friends and Family: Once a week    Frequency of Social Gatherings with Friends and Family: More than three times a week    Attends Religious Services: Never    Database administrator or Organizations: No    Attends Banker Meetings: Never    Marital Status: Married   Past Surgical History:  Procedure Laterality Date   bone spur removed  2003   left heel   CATARACT EXTRACTION Left 02/2016   CHOLECYSTECTOMY     COLONOSCOPY  08/2003   Past Surgical History:  Procedure Laterality Date   bone spur removed  2003   left heel   CATARACT EXTRACTION Left 02/2016   CHOLECYSTECTOMY  COLONOSCOPY  08/2003   Past Medical History:  Diagnosis Date   CHOLELITHIASIS, HX OF 11/09/2007   Qualifier: Diagnosis of  By: Claudene GUSS Railing     DIABETES MELLITUS, TYPE II 11/09/2007   Qualifier: Diagnosis of  By: Claudene GUSS Railing     Diverticulosis    Hyperlipidemia    BP (!) 158/84   Pulse 69   Ht 5' 2 (1.575 m)   Wt 156 lb 12.8 oz (71.1 kg)   LMP 08/12/2000   SpO2 98%   BMI 28.68 kg/m   Opioid Risk Score:   Fall Risk Score:  `1  Depression screen [facility] 2/9     03/02/2024    9:42 AM 02/11/2024   11:55 AM 01/27/2024    8:37 AM 12/12/2023   12:58 PM 10/31/2023    2:10 PM 07/29/2023   12:18 PM 04/09/2023    8:22 AM  Depression screen PHQ 2/9  Decreased Interest 0 0 0 0 0 0 0  Down, Depressed, Hopeless 0 0 0 0 0 0 0  PHQ - 2 Score 0 0 0 0 0 0 0  Altered sleeping     1    Tired, decreased energy     1    Change in  appetite     0    Feeling bad or failure about yourself      0    Trouble concentrating     1    Suicidal thoughts     0    PHQ-9 Score     3       Review of Systems  Musculoskeletal:  Positive for back pain.  All other systems reviewed and are negative.      Objective:   Physical Exam General No acute distress Mood and affect are appropriate Extremities without edema Motor strength is 5/5 bilateral hip flexor knee extensor ankle dorsiflexor Ambulates without assistive device no evidence of toe drag or knee instability Lumbar spine range of motion is normal but she does have pain when leaning to the right side and neck pain also radiates down the right lower extremity down the lateral aspect of the thigh and the leg.  Sacral thrust (prone) : Positive Lateral compression: Negative FABER's: Negative Distraction (supine): Negative Thigh thrust test: Negative Right Lateral thigh numbness to pp  Neg SLR bilateral  Lumbar spine      Assessment & Plan:  1.  Chronic low back pain with occasional pain down the right lower extremity.  We discussed that her likely pain generator is the lower lumbar facet joints.  Also facet joint arthropathy seen on the MRI is causing some neuroforaminal stenosis while this appears a little worse on the left side her symptoms are more pronounced on the right side. We discussed continuing with home exercise program and the importance of core strengthening exercises We also discussed lumbar medial branch blocks as a diagnostic tool to help define pain generator.  Would target bilateral L3 and L4 medial branches and L5 dorsal ramus.  We discussed that this would be most beneficial if her pain became more constant.  She states that for instance today she did not have any pain at all this morning. I will see her back in 6 months for follow-up

## 2024-03-02 NOTE — Patient Instructions (Signed)
 Lumbar medial branch blocks are the diagnostic procedure done under Xray guidance that we discussed

## 2024-03-06 ENCOUNTER — Other Ambulatory Visit: Payer: Self-pay | Admitting: Physician Assistant

## 2024-03-06 DIAGNOSIS — L853 Xerosis cutis: Secondary | ICD-10-CM

## 2024-03-09 DIAGNOSIS — R55 Syncope and collapse: Secondary | ICD-10-CM | POA: Diagnosis not present

## 2024-03-12 DIAGNOSIS — R55 Syncope and collapse: Secondary | ICD-10-CM

## 2024-03-15 NOTE — Progress Notes (Signed)
 HI Ann Griffith,  Your heart monitor showed that your heart rate ranged between 49 and 150 but the average was in the 70s.  There was a few beats of supraventricular tachycardia this is where the top part of the heart can beat for several seconds together.  But they did not see any atrial fibrillation and did not see any worrisome arrhythmias.  And when you triggered the device it corresponded to just normal rhythm so again nothing that should be causing you to feel like you are going to pass out.

## 2024-04-13 ENCOUNTER — Encounter: Payer: Self-pay | Admitting: Sports Medicine

## 2024-04-14 ENCOUNTER — Ambulatory Visit: Payer: Medicare HMO

## 2024-04-14 VITALS — Ht 66.5 in | Wt 153.0 lb

## 2024-04-14 DIAGNOSIS — Z0000 Encounter for general adult medical examination without abnormal findings: Secondary | ICD-10-CM

## 2024-04-14 NOTE — Patient Instructions (Signed)
  Ann Griffith , Thank you for taking time to come for your Medicare Wellness Visit. I appreciate your ongoing commitment to your health goals. Please review the following plan we discussed and let me know if I can assist you in the future.   These are the goals we discussed:  Goals       Medication Management      Patient Goals/Self-Care Activities Over the next 180 days, patient will:  take medications as prescribed  Follow Up Plan: Telephone follow up appointment with care management team member scheduled for:  6 months       Patient Stated (pt-stated)      10/06/2020 AWV Goal: Improved Nutrition/Diet  Patient will verbalize understanding that diet plays an important role in overall health and that a poor diet is a risk factor for many chronic medical conditions.  Over the next year, patient will improve self management of their diet by incorporating more water. Patient will utilize available community resources to help with food acquisition if needed (ex: food pantries, [address], etc) Patient will work with nutrition specialist if a referral was made       Patient Stated (pt-stated)      Patient would like to loose 5 lbs.      Patient Stated (pt-stated)      Patient stated that she would like to continue to remain healthy.      Patient Stated      Patient states she would to lose more weight.         This is a list of the screening recommended for you and due dates:  Health Maintenance  Topic Date Due   Eye exam for diabetics  09/19/2023   Flu Shot  03/12/2024   Yearly kidney health urinalysis for diabetes  03/30/2024   COVID-19 Vaccine (4 - 2025-26 season) 04/12/2024   Complete foot exam   03/30/2024   Hemoglobin A1C  07/28/2024   Yearly kidney function blood test for diabetes  01/26/2025   Medicare Annual Wellness Visit  04/14/2025   Mammogram  05/12/2025   DEXA scan (bone density measurement)  05/10/2026   Colon Cancer Screening  09/22/2028   DTaP/Tdap/Td vaccine  (4 - Td or Tdap) 03/30/2033   Pneumococcal Vaccine for age over 67  Completed   Hepatitis C Screening  Completed   Zoster (Shingles) Vaccine  Completed   HPV Vaccine  Aged Out   Meningitis B Vaccine  Aged Out

## 2024-04-14 NOTE — Progress Notes (Signed)
 Subjective:   Ann Griffith is a 73 y.o. female who presents for Medicare Annual (Subsequent) preventive examination.  Visit Complete: Virtual I connected with  Ann Griffith on 04/14/24 by a audio enabled telemedicine application and verified that I am speaking with the correct person using two identifiers.  Patient Location: Home  Provider Location: Office/Clinic  I discussed the limitations of evaluation and management by telemedicine. The patient expressed understanding and agreed to proceed.  Vital Signs: Because this visit was a virtual/telehealth visit, some criteria may be missing or patient reported. Any vitals not documented were not able to be obtained and vitals that have been documented are patient reported.  Patient Medicare AWV questionnaire was completed by the patient on n/a; I have confirmed that all information answered by patient is correct and no changes since this date.  Cardiac Risk Factors include: advanced age (>48men, >60 women);hypertension;diabetes mellitus;dyslipidemia     Objective:    Today's Vitals   04/14/24 0800 04/14/24 0801  Weight: 153 lb (69.4 kg)   Height: 5' 6.5 (1.689 m)   PainSc:  5    Body mass index is 24.32 kg/m.     04/14/2024    8:11 AM 11/26/2023    2:07 PM 04/09/2023    8:22 AM 12/11/2021    9:03 AM 10/06/2020   10:07 AM 04/12/2019    9:01 AM  Advanced Directives  Does Patient Have a Medical Advance Directive? Yes Yes Yes Yes Yes Yes  Type of Estate agent of [place];Living will Healthcare Power of [place];Living will Living will Living will;Healthcare Power of [employer] Power of [place];Living will Living will  Does patient want to make changes to medical advance directive? No - Patient declined No - Patient declined No - Patient declined No - Patient declined No - Patient declined No - Patient declined  Copy of Healthcare Power of Attorney in Chart?  No - copy requested  No - copy requested  No - copy requested     Current Medications (verified) Outpatient Encounter Medications as of 04/14/2024  Medication Sig   AMBULATORY NON FORMULARY MEDICATION Single glucometer with lancets, test strips   blood glucose meter kit and supplies Dispense based on patient and insurance preference. Use up to four times daily as directed. (FOR ICD-10 E10.9, E11.9).   Cyanocobalamin  (VITAMIN B-12 PO) Take 5,000 mcg by mouth once a week.   diclofenac  (VOLTAREN ) 75 MG EC tablet TAKE 1 TABLET BY MOUTH TWICE A DAY   diclofenac  Sodium (VOLTAREN ) 1 % GEL APPLY 4 GRAMS 4 TIMES DAILY TOPICALLY. TO AFFECTED JOINT.   dorzolamide-timolol (COSOPT) 2-0.5 % ophthalmic solution Place 1 drop into the left eye 2 (two) times daily.   fish oil-omega-3 fatty acids 1000 MG capsule Take 1 g by mouth daily.   glucose blood (ONETOUCH VERIO) test strip Dx DM E11.9. Check blood sugar up to 3 times daily.   hydrochlorothiazide  (HYDRODIURIL ) 12.5 MG tablet Take 1 tablet (12.5 mg total) by mouth daily.   Lancets (ONETOUCH DELICA PLUS LANCET30G) MISC DX DM E11.9. CHECK BLOOD SUGAR UP TO 3 TIMES DAILY.   lisinopril  (ZESTRIL ) 20 MG tablet Take 1 tablet (20 mg total) by mouth daily.   Magnesium 125 MG CAPS Take by mouth.   metFORMIN  (GLUCOPHAGE ) 500 MG tablet Take 1 tablet (500 mg total) by mouth daily with breakfast.   rOPINIRole  (REQUIP ) 0.5 MG tablet TAKE 1 TABLET BY MOUTH 3 TIMES DAILY.   rosuvastatin  (CRESTOR ) 20 MG tablet Take 1 tablet (  20 mg total) by mouth daily.   triamcinolone  cream (KENALOG ) 0.1 % APPLY TO AFFECTED AREA TWICE A DAY   TURMERIC PO Take 500 mg by mouth daily.   VITAMIN D  PO Take 5,000 Int'l Units by mouth daily.   No facility-administered encounter medications on file as of 04/14/2024.    Allergies (verified) Amoxicillin, Gabapentin , and Atorvastatin    History: Past Medical History:  Diagnosis Date   CHOLELITHIASIS, HX OF 11/09/2007   Qualifier: Diagnosis of  By: Claudene GUSS Railing     DIABETES  MELLITUS, TYPE II 11/09/2007   Qualifier: Diagnosis of  By: Claudene GUSS Railing     Diverticulosis    Hyperlipidemia    Past Surgical History:  Procedure Laterality Date   bone spur removed  2003   left heel   CATARACT EXTRACTION Left 02/2016   CHOLECYSTECTOMY     COLONOSCOPY  08/2003   Family History  Problem Relation Age of Onset   Diabetes Mother    Social History   Socioeconomic History   Marital status: Married    Spouse name: Arley   Number of children: 1   Years of education: 12   Highest education level: High school graduate  Occupational History   Occupation: data entry Solicitor in receiving    Comment: Retired  Tobacco Use   Smoking status: Never   Smokeless tobacco: Never  Vaping Use   Vaping status: Never Used  Substance and Sexual Activity   Alcohol use: Yes    Alcohol/week: 0.0 - 1.0 standard drinks of alcohol    Comment: occasionally   Drug use: No   Sexual activity: Yes    Partners: Male    Birth control/protection: Post-menopausal  Other Topics Concern   Not on file  Social History Narrative   Lives with her husband. Likes to go to movies and travel.    Social Drivers of Corporate investment banker Strain: Low Risk  (04/14/2024)   Overall Financial Resource Strain (CARDIA)    Difficulty of Paying Living Expenses: Not hard at all  Food Insecurity: No Food Insecurity (04/14/2024)   Hunger Vital Sign    Worried About Running Out of Food in the Last Year: Never true    Ran Out of Food in the Last Year: Never true  Transportation Needs: No Transportation Needs (04/14/2024)   PRAPARE - Administrator, Civil Service (Medical): No    Lack of Transportation (Non-Medical): No  Physical Activity: Sufficiently Active (04/14/2024)   Exercise Vital Sign    Days of Exercise per Week: 3 days    Minutes of Exercise per Session: 60 min  Stress: No Stress Concern Present (04/14/2024)   [employer] of Occupational Health - Occupational Stress  Questionnaire    Feeling of Stress: Not at all  Social Connections: Moderately Integrated (04/14/2024)   Social Connection and Isolation Panel    Frequency of Communication with Friends and Family: Twice a week    Frequency of Social Gatherings with Friends and Family: Once a week    Attends Religious Services: Never    Database administrator or Organizations: Yes    Attends Engineer, structural: More than 4 times per year    Marital Status: Married    Tobacco Counseling Counseling given: Not Answered   Clinical Intake:     Pain : 0-10 Pain Score: 5  Pain Type: Chronic pain Pain Location: Back Pain Orientation: Lower Pain Onset: More than a month ago Pain Frequency:  Intermittent     BMI - recorded: 24.32 Nutritional Status: BMI of 19-24  Normal Nutritional Risks: None Diabetes: Yes CBG done?: No Did pt. bring in CBG monitor from home?: No  How often do you need to have someone help you when you read instructions, pamphlets, or other written materials from your doctor or pharmacy?: 1 - Never What is the last grade level you completed in school?: 12  Interpreter Needed?: No      Activities of Daily Living    04/14/2024    8:03 AM  In your present state of health, do you have any difficulty performing the following activities:  Hearing? 0  Vision? 0  Difficulty concentrating or making decisions? 1  Walking or climbing stairs? 0  Dressing or bathing? 0  Doing errands, shopping? 0  Preparing Food and eating ? N  Using the Toilet? N  In the past six months, have you accidently leaked urine? Y  Do you have problems with loss of bowel control? N  Managing your Medications? N  Managing your Finances? N  Housekeeping or managing your Housekeeping? N    Patient Care Team: Breeback, Jade L, PA-C as PCP - General (Family Medicine) Carilyn Prentice BRAVO, MD as Consulting Physician (Physical Medicine and Rehabilitation) Haverstock, Tawni CROME, MD as Referring  Physician (Dermatology)  Indicate any recent Medical Services you may have received from other than [facility] providers in the past year (date may be approximate).     Assessment:   This is a routine wellness examination for Mackenzie.  Hearing/Vision screen No results found.   Goals Addressed             This Visit's Progress    Patient Stated       Patient states she would to lose more weight.        Depression Screen    04/14/2024    8:10 AM 03/02/2024    9:42 AM 02/11/2024   11:55 AM 01/27/2024    8:37 AM 12/12/2023   12:58 PM 10/31/2023    2:10 PM 07/29/2023   12:18 PM  PHQ 2/9 Scores  PHQ - 2 Score 0 0 0 0 0 0 0  PHQ- 9 Score      3     Fall Risk    04/14/2024    8:11 AM 03/02/2024    9:42 AM 02/11/2024   11:54 AM 01/27/2024    8:37 AM 12/12/2023   12:58 PM  Fall Risk   Falls in the past year? 1  1 0 0  Number falls in past yr: 0 0 0 0   Comment  02/10/24 fell during night and broke rib     Injury with Fall? 1 1 1  0   Risk for fall due to : Impaired balance/gait  Other (Comment)    Follow up Falls evaluation completed  Falls prevention discussed;Education provided;Falls evaluation completed      MEDICARE RISK AT HOME: Medicare Risk at Home Any stairs in or around the home?: Yes If so, are there any without handrails?: Yes Home free of loose throw rugs in walkways, pet beds, electrical cords, etc?: No Adequate lighting in your home to reduce risk of falls?: Yes Life alert?: No Use of a cane, walker or w/c?: No Grab bars in the bathroom?: Yes Shower chair or bench in shower?: Yes Elevated toilet seat or a handicapped toilet?: Yes  TIMED UP AND GO:  Was the test performed?  No    Cognitive  Function:    09/01/2019    9:05 AM  MMSE - Mini Mental State Exam  Orientation to time 5  Orientation to Place 5  Registration 3  Attention/ Calculation 5  Recall 3  Language- name 2 objects 2  Language- repeat 1  Language- follow 3 step command 3  Language- read & follow  direction 1  Write a sentence 1  Copy design 1  Total score 30      01/27/2024    9:46 AM 01/14/2022    2:11 PM  Montreal Cognitive Assessment   Visuospatial/ Executive (0/5) 4 5  Naming (0/3) 3 3  Attention: Read list of digits (0/2) 2 0  Attention: Read list of letters (0/1) 1 1  Attention: Serial 7 subtraction starting at 100 (0/3) 3 1  Language: Repeat phrase (0/2) 2 2  Language : Fluency (0/1) 1 1  Abstraction (0/2) 2 2  Delayed Recall (0/5) 2 3  Orientation (0/6) 6 6  Total 26 24  Adjusted Score (based on education) 27       04/14/2024    8:13 AM 04/09/2023    8:28 AM 12/11/2021    9:11 AM 10/06/2020   10:15 AM  6CIT Screen  What Year? 0 points 0 points 0 points 0 points  What month? 0 points 0 points 0 points 0 points  What time? 0 points 0 points 0 points 0 points  Count back from 20 0 points 0 points 0 points 0 points  Months in reverse 0 points 0 points 0 points 0 points  Repeat phrase 0 points 0 points 0 points 0 points  Total Score 0 points 0 points 0 points 0 points    Immunizations Immunization History  Administered Date(s) Administered   Fluad Quad(high Dose 65+) 05/31/2019, 06/13/2020   Influenza,inj,Quad PF,6+ Mos 05/02/2014, 05/05/2015, 06/04/2016, 06/24/2017, 04/27/2018   Influenza-Unspecified 06/07/2021, 05/06/2022   PFIZER(Purple Top)SARS-COV-2 Vaccination 10/05/2019, 10/26/2019, 04/28/2020   Pneumococcal Conjugate-13 05/05/2015   Pneumococcal Polysaccharide-23 06/04/2016   Respiratory Syncytial Virus Vaccine ,Recomb Aduvanted(Arexvy ) 10/28/2023   Tdap 12/21/2009, 10/14/2012, 03/31/2023   Zoster Recombinant(Shingrix) 11/17/2019, 03/28/2020    TDAP status: Up to date  Flu Vaccine status: Due, Education has been provided regarding the importance of this vaccine. Advised may receive this vaccine at local pharmacy or Health Dept. Aware to provide a copy of the vaccination record if obtained from local pharmacy or Health Dept. Verbalized acceptance and  understanding.  Pneumococcal vaccine status: Due, Education has been provided regarding the importance of this vaccine. Advised may receive this vaccine at local pharmacy or Health Dept. Aware to provide a copy of the vaccination record if obtained from local pharmacy or Health Dept. Verbalized acceptance and understanding.  Covid-19 vaccine status: Declined, Education has been provided regarding the importance of this vaccine but patient still declined. Advised may receive this vaccine at local pharmacy or Health Dept.or vaccine clinic. Aware to provide a copy of the vaccination record if obtained from local pharmacy or Health Dept. Verbalized acceptance and understanding.  Qualifies for Shingles Vaccine? Yes   Zostavax completed No   Shingrix Completed?: Yes  Screening Tests Health Maintenance  Topic Date Due   OPHTHALMOLOGY EXAM  09/19/2023   INFLUENZA VACCINE  03/12/2024   Diabetic kidney evaluation - Urine ACR  03/30/2024   COVID-19 Vaccine (4 - 2025-26 season) 04/12/2024   FOOT EXAM  03/30/2024   HEMOGLOBIN A1C  07/28/2024   Diabetic kidney evaluation - eGFR measurement  01/26/2025   Medicare Annual Wellness (  AWV)  04/14/2025   MAMMOGRAM  05/12/2025   DEXA SCAN  05/10/2026   Colonoscopy  09/22/2028   DTaP/Tdap/Td (4 - Td or Tdap) 03/30/2033   Pneumococcal Vaccine: 50+ Years  Completed   Hepatitis C Screening  Completed   Zoster Vaccines- Shingrix  Completed   HPV VACCINES  Aged Out   Meningococcal B Vaccine  Aged Out    Health Maintenance  Health Maintenance Due  Topic Date Due   OPHTHALMOLOGY EXAM  09/19/2023   INFLUENZA VACCINE  03/12/2024   Diabetic kidney evaluation - Urine ACR  03/30/2024   COVID-19 Vaccine (4 - 2025-26 season) 04/12/2024   FOOT EXAM  03/30/2024    Colorectal cancer screening: Type of screening: Colonoscopy. Completed 09/22/2018. Repeat every 10 years  Mammogram status: Completed 05/13/2023. Repeat every year  Bone Density status: Completed  05/11/2023. Results reflect: Bone density results: NORMAL. Repeat every 5 years.  Lung Cancer Screening: (Low Dose CT Chest recommended if Age 71-80 years, 20 pack-year currently smoking OR have quit w/in 15years.) does not qualify.   Lung Cancer Screening Referral: n/a  Additional Screening:  Hepatitis C Screening: does qualify; Completed 10/30/2015  Vision Screening: Recommended annual ophthalmology exams for early detection of glaucoma and other disorders of the eye. Is the patient up to date with their annual eye exam?  Yes  Who is the provider or what is the name of the office in which the patient attends annual eye exams? Dr Arloa If pt is not established with a provider, would they like to be referred to a provider to establish care? N/a.   Dental Screening: Recommended annual dental exams for proper oral hygiene  Diabetic Foot Exam: Diabetic Foot Exam: Completed 03/31/2023  Community Resource Referral / Chronic Care Management: CRR required this visit?  No   CCM required this visit?  No     Plan:     I have personally reviewed and noted the following in the patient's chart:   Medical and social history Use of alcohol, tobacco or illicit drugs  Current medications and supplements including opioid prescriptions. Patient is not currently taking opioid prescriptions. Functional ability and status Nutritional status Physical activity Advanced directives List of other physicians Hospitalizations, surgeries, and ER visits in previous 12 months. None Vitals Screenings to include cognitive, depression, and falls Referrals and appointments  In addition, I have reviewed and discussed with patient certain preventive protocols, quality metrics, and best practice recommendations. A written personalized care plan for preventive services as well as general preventive health recommendations were provided to patient.     Bonny Jon Mayor, CMA   04/14/2024   After Visit Summary:  (MyChart) Due to this being a telephonic visit, the after visit summary with patients personalized plan was offered to patient via MyChart   Nurse Notes:   Brendi Mccarroll is a 73 y.o. female patient of Antoniette Vermell CROME, PA-C who had a [employer] Visit today via telephone. Kourtnee is Retired and lives with their spouse. She has 1 child. She reports that she is socially active and does interact with friends/family regularly. She is moderately physically active and enjoys travelling and going to the movies.

## 2024-04-20 ENCOUNTER — Other Ambulatory Visit: Payer: Self-pay | Admitting: Physician Assistant

## 2024-04-20 DIAGNOSIS — I10 Essential (primary) hypertension: Secondary | ICD-10-CM

## 2024-04-21 ENCOUNTER — Other Ambulatory Visit: Payer: Self-pay | Admitting: Physician Assistant

## 2024-04-21 DIAGNOSIS — G2581 Restless legs syndrome: Secondary | ICD-10-CM

## 2024-04-22 ENCOUNTER — Other Ambulatory Visit: Payer: Self-pay | Admitting: Physician Assistant

## 2024-04-22 DIAGNOSIS — L853 Xerosis cutis: Secondary | ICD-10-CM

## 2024-05-18 ENCOUNTER — Telehealth: Payer: Self-pay | Admitting: Physical Medicine & Rehabilitation

## 2024-05-18 ENCOUNTER — Ambulatory Visit: Admitting: Physician Assistant

## 2024-05-18 DIAGNOSIS — Z1231 Encounter for screening mammogram for malignant neoplasm of breast: Secondary | ICD-10-CM | POA: Diagnosis not present

## 2024-05-18 LAB — HM MAMMOGRAPHY

## 2024-05-18 NOTE — Telephone Encounter (Signed)
 P would like a call about a code for a lower back procedure for insurance purposes. She also stated the follow up she has in the future is supposed to be a procedure. Can someone look into this and give her a call when available

## 2024-05-19 ENCOUNTER — Ambulatory Visit: Admitting: Physician Assistant

## 2024-05-19 VITALS — BP 141/70 | HR 71 | Ht 62.0 in | Wt 153.0 lb

## 2024-05-19 DIAGNOSIS — Z7984 Long term (current) use of oral hypoglycemic drugs: Secondary | ICD-10-CM

## 2024-05-19 DIAGNOSIS — I10 Essential (primary) hypertension: Secondary | ICD-10-CM

## 2024-05-19 DIAGNOSIS — G2581 Restless legs syndrome: Secondary | ICD-10-CM | POA: Diagnosis not present

## 2024-05-19 DIAGNOSIS — R0981 Nasal congestion: Secondary | ICD-10-CM

## 2024-05-19 DIAGNOSIS — E1122 Type 2 diabetes mellitus with diabetic chronic kidney disease: Secondary | ICD-10-CM

## 2024-05-19 DIAGNOSIS — N1831 Chronic kidney disease, stage 3a: Secondary | ICD-10-CM | POA: Diagnosis not present

## 2024-05-19 DIAGNOSIS — R42 Dizziness and giddiness: Secondary | ICD-10-CM

## 2024-05-19 LAB — POCT GLYCOSYLATED HEMOGLOBIN (HGB A1C): Hemoglobin A1C: 5.8 % — AB (ref 4.0–5.6)

## 2024-05-19 MED ORDER — ROPINIROLE HCL 0.5 MG PO TABS: ORAL_TABLET | ORAL | 1 refills | Status: AC

## 2024-05-19 MED ORDER — FLUTICASONE PROPIONATE 50 MCG/ACT NA SUSP: 2.0000 | Freq: Every day | NASAL | 0 refills | Status: DC

## 2024-05-19 NOTE — Progress Notes (Unsigned)
 Established Patient Office Visit  Subjective   Patient ID: Ann Griffith, female    DOB: 12-Oct-1950  Age: 73 y.o. MRN: [id number]  Chief Complaint  Patient presents with   Medical Management of Chronic Issues    HPI Discussed the use of AI scribe software for clinical note transcription with the patient, who gave verbal consent to proceed.  History of Present Illness Ann Griffith is a 73 year old female who presents for a routine follow-up visit.  Cognitive impairment and memory concerns - Persistent memory issues despite a previously negative Alzheimer's test - Interest in non-pharmacological interventions for memory support, including sauna use at her gym  Orthostatic symptoms and syncope - History of lightheadedness and syncope, with four episodes over the past five years - Most recent syncopal episode resulted in a fall and rib fracture - Lightheadedness occurs when standing or walking - Unilateral facial puffiness, suspected to be related to orthostatic symptoms - No current use of nasal sprays  Peripheral sensory changes - Sensory changes in the feet, particularly in the heels, midfoot, and one big toe - Feet feel cold at times  Blood pressure variability - Home blood pressure readings elevated in the morning (140-160 mmHg), with improvement later in the day - Takes antihypertensive medication at night  Medication use and concerns - Takes Requip  four times daily (two doses at night, one at 2 PM, one at 7 PM) - Takes two Tylenol  PMs at night - Concerned about liver enzyme levels due to Tylenol  use; liver enzymes are monitored regularly  Glycemic control - Hemoglobin A1c remains stable at 5.8     ROS See HPI.    Objective:     BP (!) 141/70   Pulse 71   Ht 5' 2 (1.575 m)   Wt 153 lb (69.4 kg)   LMP 08/12/2000   SpO2 99%   BMI 27.98 kg/m  BP Readings from Last 3 Encounters:  05/19/24 (!) 141/70  03/02/24 (!) 158/84  02/11/24 (!) 149/66   Wt  Readings from Last 3 Encounters:  05/19/24 153 lb (69.4 kg)  04/14/24 153 lb (69.4 kg)  03/02/24 156 lb 12.8 oz (71.1 kg)    .[name] Diabetic Foot Exam - Simple   Simple Foot Form Diabetic Foot exam was performed with the following findings: Yes 05/19/2024  9:32 AM  Visual Inspection No deformities, no ulcerations, no other skin breakdown bilaterally: Yes Sensation Testing Intact to touch and monofilament testing bilaterally: Yes Pulse Check Posterior Tibialis and Dorsalis pulse intact bilaterally: Yes Comments      Physical Exam Constitutional:      Appearance: Normal appearance.  HENT:     Head: Normocephalic.  Neck:     Vascular: No carotid bruit.  Cardiovascular:     Rate and Rhythm: Normal rate and regular rhythm.  Musculoskeletal:     Cervical back: Normal range of motion and neck supple. No tenderness.     Right lower leg: No edema.     Left lower leg: No edema.  Lymphadenopathy:     Cervical: No cervical adenopathy.  Neurological:     General: No focal deficit present.     Mental Status: She is alert and oriented to person, place, and time.  Psychiatric:        Mood and Affect: Mood normal.         Assessment & Plan:  .Frederick was seen today for medical management of chronic issues.  Diagnoses and all orders for this visit:  Type 2 diabetes mellitus with stage 3a chronic kidney disease, without long-term current use of insulin (HCC) -     POCT HgB A1C  Dizziness  Nasal congestion -     fluticasone  (FLONASE ) 50 MCG/ACT nasal spray; Place 2 sprays into both nostrils daily.  RLS (restless legs syndrome) -     rOPINIRole  (REQUIP ) 0.5 MG tablet; Take one tablet at 2pm and 7pm and two tablets at bedtime.   Assessment & Plan Lightheadedness and recurrent syncope Orthostatic hypotension considered but orthostatic vitals normal today. Lightheadedness possibly related to orthostatic changes. - ?sinuses with dizziness, start flonase   Type 2 diabetes mellitus  associated with CKD 3a Type 2 diabetes well-controlled with A1c of 5.8. Diabetic neuropathy with some sensation loss in heels and middle of feet. - Perform foot exam today, with no acute concerns. Some areas of neuropathy at heels.  - patient already has her flu shot - pt will schedule eye exam - on statin - pneumonia vaccine UTD  Hypertension Blood pressure higher in morning, improves throughout day. Medications taken at night.  Restless legs syndrome Managed with Requip , currently taking four tablets daily. - Adjust Requip  prescription to four tablets daily.  Chronic lumbar pain Evaluating for lumbar medial branch block. Considered safe and potentially effective for pain relief and improved mobility.  Nasal congestion, likely allergic rhinitis Likely due to allergic rhinitis, possibly exacerbated by ragweed season. Puffiness on one side of face and lightheadedness may relate to sinus congestion. - Prescribe Flonase  for nasal congestion.  Follow up in 3 months  Vermell Bologna, PA-C

## 2024-05-20 ENCOUNTER — Encounter: Payer: Self-pay | Admitting: Physician Assistant

## 2024-05-21 ENCOUNTER — Encounter: Payer: Self-pay | Admitting: Physician Assistant

## 2024-06-06 ENCOUNTER — Other Ambulatory Visit: Payer: Self-pay | Admitting: Physician Assistant

## 2024-06-06 DIAGNOSIS — L853 Xerosis cutis: Secondary | ICD-10-CM

## 2024-06-11 ENCOUNTER — Other Ambulatory Visit: Payer: Self-pay | Admitting: Physician Assistant

## 2024-06-11 DIAGNOSIS — R0981 Nasal congestion: Secondary | ICD-10-CM

## 2024-06-16 ENCOUNTER — Encounter: Payer: Self-pay | Admitting: Physician Assistant

## 2024-06-25 NOTE — Progress Notes (Signed)
 Shanece Cochrane                                          MRN: [id number]   06/25/2024   The [facility] Quality Team Specialist reviewed this patient medical record for the purposes of chart review for care gap closure. The following were reviewed: chart review for care gap closure-controlling blood pressure.    [facility] Quality Team

## 2024-07-12 DIAGNOSIS — H401423 Capsular glaucoma with pseudoexfoliation of lens, left eye, severe stage: Secondary | ICD-10-CM | POA: Diagnosis not present

## 2024-07-13 ENCOUNTER — Other Ambulatory Visit: Payer: Self-pay | Admitting: Physician Assistant

## 2024-07-13 DIAGNOSIS — E785 Hyperlipidemia, unspecified: Secondary | ICD-10-CM

## 2024-07-13 DIAGNOSIS — Z789 Other specified health status: Secondary | ICD-10-CM

## 2024-07-15 ENCOUNTER — Ambulatory Visit: Payer: Self-pay

## 2024-07-15 NOTE — Telephone Encounter (Signed)
 FYI Only or Action Required?: FYI only for provider: appointment scheduled on 07/16/2024 at 11:30 AM.  Patient was last seen in primary care on 05/19/2024 by Antoniette Rasher L, PA-C.  Called Nurse Triage reporting Leg Swelling.  Symptoms began several weeks ago.  Interventions attempted: Rest, hydration, or home remedies.  Symptoms are: unchanged.  Triage Disposition: See Physician Within 24 Hours  Patient/caregiver understands and will follow disposition?: Yes  Copied from CRM #[id number]. Topic: Clinical - Red Word Triage >> Jul 15, 2024 10:49 AM Charlet HERO wrote: Red Word that prompted transfer to Nurse Triage: Patient is calling left leg is swollen, she states that there is no redness and pain in back of knee in the evenings . Jade Breeback. Reason for Disposition  [1] MODERATE leg swelling (e.g., swelling extends up to knees) AND [2] new-onset or getting worse  Answer Assessment - Initial Assessment Questions Patient reports mild swelling to left calf area to top of leg. Patient states this has been going on for 6-8 weeks. Wanting to get checked out before she goes out of town in January. Scheduled to see PCP tomorrow at 11:30 AM.   1. ONSET: When did the swelling start? (e.g., minutes, hours, days)     Started 6-8 weeks ago 2. LOCATION: What part of the leg is swollen?  Are both legs swollen or just one leg?     From mid calf to top of leg 3. SEVERITY: How bad is the swelling? (e.g., localized; mild, moderate, severe)     Mild swelling 4. REDNESS: Is there redness or signs of infection?     no 5. PAIN: Is the swelling painful to touch? If Yes, ask: How painful is it?   (Scale 1-10; mild, moderate or severe)     no 6. FEVER: Do you have a fever? If Yes, ask: What is it, how was it measured, and when did it start?      no 7. CAUSE: What do you think is causing the leg swelling?     unsure 8. MEDICAL HISTORY: Do you have a history of blood clots (e.g., DVT),  cancer, heart failure, kidney disease, or liver failure?     no 9. RECURRENT SYMPTOM: Have you had leg swelling before? If Yes, ask: When was the last time? What happened that time?     no 10. OTHER SYMPTOMS: Do you have any other symptoms? (e.g., chest pain, difficulty breathing)       no  Protocols used: Leg Swelling and Edema-A-AH

## 2024-07-16 ENCOUNTER — Telehealth: Payer: Self-pay

## 2024-07-16 ENCOUNTER — Ambulatory Visit: Payer: Self-pay | Admitting: Physician Assistant

## 2024-07-16 ENCOUNTER — Ambulatory Visit: Admitting: Physician Assistant

## 2024-07-16 VITALS — BP 154/93 | HR 90 | Wt 154.0 lb

## 2024-07-16 DIAGNOSIS — M25562 Pain in left knee: Secondary | ICD-10-CM

## 2024-07-16 DIAGNOSIS — G8929 Other chronic pain: Secondary | ICD-10-CM | POA: Diagnosis not present

## 2024-07-16 DIAGNOSIS — M79605 Pain in left leg: Secondary | ICD-10-CM | POA: Diagnosis not present

## 2024-07-16 LAB — BMP8+EGFR: EGFR: 67

## 2024-07-16 MED ORDER — TRAMADOL HCL 50 MG PO TABS: 50.0000 mg | ORAL_TABLET | Freq: Four times a day (QID) | ORAL | 0 refills | Status: AC | PRN

## 2024-07-16 NOTE — Progress Notes (Signed)
 Acute Office Visit  Subjective:     Patient ID: Ann Griffith, female    DOB: 11/23/1950, 73 y.o.   MRN: [id number]  Chief Complaint  Patient presents with   Leg Swelling    HPI .Ann Griffith the use of AI scribe software for clinical note transcription with the patient, who gave verbal consent to proceed.  History of Present Illness Ann Griffith is a 73 year old female who presents with left leg pain and swelling.  Left leg pain and swelling - Persistent pain and swelling in the left leg for the past few weeks, primarily at the back of the knee - Pain described as very tight, especially at night and sometimes during the day - Pain intensity rated as 6/10 during the day, escalating to 9-10/10 in the evening - Pain worsens after prolonged sitting or walking; maximum sitting duration is about two hours before needing to move and stretch - Stretching for approximately fifteen minutes provides relief - Voltaren  gel provides relief for the rest of the leg but not for the tightness at the back of the knee - Measured left upper thigh is one inch larger than the right leg - Concern for possible blood clots, especially with upcoming two-month vacation starting January 3rd  Restless leg syndrome and muscle pain - History of restless leg syndrome and muscle pain - Requip  provides relief for restless leg symptoms but not for severe pain in the back of the knee  Previous hip problem and physical therapy - History of previous hip problem - Completed physical therapy but found the number of exercises overwhelming - Continues to stretch her leg regularly    ROS See HPI.      Objective:    BP (!) 154/93   Pulse 90   Wt 154 lb (69.9 kg)   LMP 08/12/2000   SpO2 99%   BMI 28.17 kg/m  BP Readings from Last 3 Encounters:  07/16/24 (!) 154/93  05/19/24 (!) 141/70  03/02/24 (!) 158/84   Wt Readings from Last 3 Encounters:  07/16/24 154 lb (69.9 kg)  05/19/24 153 lb (69.4 kg)   04/14/24 153 lb (69.4 kg)      Physical Exam Constitutional:      Appearance: Normal appearance.  HENT:     Head: Normocephalic.  Cardiovascular:     Rate and Rhythm: Normal rate and regular rhythm.  Pulmonary:     Effort: Pulmonary effort is normal.  Musculoskeletal:     Comments: No noticeable swelling of bilateral lower extremities.  Bilateral pedal pulses 2+. NROM of both legs No pain to palpation over bony landmarks of knee No evidence of fluid wave or fluid accumulation behind left knee.   Neurological:     General: No focal deficit present.     Mental Status: She is alert and oriented to person, place, and time.  Psychiatric:        Mood and Affect: Mood normal.         Assessment & Plan:  Ann Griffith was seen today for leg swelling.  Diagnoses and all orders for this visit:  Left leg pain -     D-dimer, quantitative -     BMP8+eGFR -     traMADol  (ULTRAM ) 50 MG tablet; Take 1 tablet (50 mg total) by mouth every 6 (six) hours as needed for up to 5 days. -     DG Knee 4 Views W/Patella Left; Future -     DG Knee Complete 4 Views Right;  Future  Chronic pain of left knee -     traMADol  (ULTRAM ) 50 MG tablet; Take 1 tablet (50 mg total) by mouth every 6 (six) hours as needed for up to 5 days. -     DG Knee 4 Views W/Patella Left; Future -     DG Knee Complete 4 Views Right; Future    Assessment & Plan Left leg pain and swelling Chronic pain and swelling behind the knee, worsened by sitting, relieved by stretching. Differential includes arthritis, muscular pain, and potential blood clot. Good circulation and strength observed. Concern for blood clot due to travel. - Ordered D-dimer test to rule out blood clot. - Ordered x-ray of both knees to assess for arthritis. - Prescribed tramadol  for severe pain management. - If D-dimer is elevated, will arrange for venous Doppler ultrasound.  Restless legs syndrome Managed with Requip , providing some relief. No  significant improvement in knee pain with current treatment. - Continue Requip  for restless legs syndrome.     Return if symptoms worsen or fail to improve.  Ann Sherfield, PA-C

## 2024-07-16 NOTE — Patient Instructions (Signed)
 Will order xray to get today.  Tramadol  sent to pharmacy for pain as needed.  Get d-dimer if elevated will do ultrasound of leg.

## 2024-07-16 NOTE — Telephone Encounter (Signed)
 Copied from CRM [id number]. Topic: Clinical - Request for Lab/Test Order >> Jul 16, 2024  1:13 PM Delon DASEN wrote: Reason for CRM: went to get the xrays done but they did not have the orders, will go and get them done on Monday

## 2024-07-16 NOTE — Telephone Encounter (Signed)
 Patient is okay with getting them done on mon and she has confirmed the orders are in

## 2024-07-16 NOTE — Progress Notes (Signed)
 GREAT news. D-dimer negative. No sign of blood clot.

## 2024-07-18 ENCOUNTER — Other Ambulatory Visit: Payer: Self-pay | Admitting: Physician Assistant

## 2024-07-18 DIAGNOSIS — E1122 Type 2 diabetes mellitus with diabetic chronic kidney disease: Secondary | ICD-10-CM

## 2024-07-18 DIAGNOSIS — I10 Essential (primary) hypertension: Secondary | ICD-10-CM

## 2024-07-19 ENCOUNTER — Ambulatory Visit

## 2024-07-19 DIAGNOSIS — M25562 Pain in left knee: Secondary | ICD-10-CM

## 2024-07-19 DIAGNOSIS — M79605 Pain in left leg: Secondary | ICD-10-CM | POA: Diagnosis not present

## 2024-07-19 DIAGNOSIS — M7652 Patellar tendinitis, left knee: Secondary | ICD-10-CM | POA: Diagnosis not present

## 2024-07-19 DIAGNOSIS — M1711 Unilateral primary osteoarthritis, right knee: Secondary | ICD-10-CM | POA: Diagnosis not present

## 2024-07-19 DIAGNOSIS — G8929 Other chronic pain: Secondary | ICD-10-CM

## 2024-07-22 NOTE — Progress Notes (Signed)
 Ann Griffith                                          MRN: [id number]   07/22/2024   The [facility] Quality Team Specialist reviewed this patient medical record for the purposes of chart review for care gap closure. The following were reviewed: chart review for care gap closure-controlling blood pressure.    [facility] Quality Team

## 2024-07-24 ENCOUNTER — Other Ambulatory Visit: Payer: Self-pay | Admitting: Physician Assistant

## 2024-07-24 DIAGNOSIS — L853 Xerosis cutis: Secondary | ICD-10-CM

## 2024-07-26 NOTE — Progress Notes (Signed)
 Left more than right arthritis in knee concentrated in the patellofemoral compartment with some small bone spurs. I do think getting in with sports med for injection consideration would be a great next step. Thoughts?

## 2024-07-29 ENCOUNTER — Ambulatory Visit

## 2024-07-29 VITALS — BP 130/78 | Ht 62.0 in | Wt 154.0 lb

## 2024-07-29 DIAGNOSIS — G8929 Other chronic pain: Secondary | ICD-10-CM

## 2024-07-29 DIAGNOSIS — M1712 Unilateral primary osteoarthritis, left knee: Secondary | ICD-10-CM | POA: Diagnosis not present

## 2024-07-29 DIAGNOSIS — M25562 Pain in left knee: Secondary | ICD-10-CM

## 2024-07-29 MED ORDER — NAPROXEN 500 MG PO TABS: 500.0000 mg | ORAL_TABLET | Freq: Two times a day (BID) | ORAL | 1 refills | Status: AC

## 2024-07-29 NOTE — Patient Instructions (Signed)
 Knee Osteoarthritis Treatment Options  Physical therapy/home exercises (strong body of evidence supporting this. It's the cornerstone of any arthritis treatment regimen I recommend, motion is lotion!)   Turmeric (1000 mg) + black pepper (10mg ) once daily (reasonable anti-inflammatory effect if you want to avoid medicines)   Topical anti-inflammatories (Voltaren  Gel... Very safe. Almost no systemic absorption. Best applied 4x daily but can start with 2x daily)   Oral anti-inflammatories (ibuprofen , naproxen , etc.... good for flares but not a safe long-term option)   Joint injections --- Steroids (also good for flares, can be done as frequently as every 3 months, but can accelerate the thinning of cartilage if done repeatedly, covered by insurance)  --- Anti-inflammatories (good for flares, can be done as frequently as every 3 months, less harmful to cartilage than steroids, covered by insurance) --- Gel injections (probably a better maintenance therapy but still can be helpful for flares, can be done as frequently as every 6 months, less harmful cartilage and than steroids, covered by nearly all insurance) --- Platelet rich plasma (has the strongest body of evidence among injection options, typically done as single injection but can be repeated, not harmful to cartilage, $500 out of pocket, not covered by insurance) --- Dextrose Prolotherapy (actually a sugar water solution, similar treatment mechanism to platelet rich plasma but not as robust, not harmful to cartilage, covered by insurance)   Genicular nerve block (the 3 small nerves that sense pain from the knee joint can be numbed under ultrasound guidance, can be repeated every 3 months, not harmful to cartilage, covered by insurance)   Total knee replacement surgery   None of these options (except surgery, or course) preclude the others so your treatment plan can be totally customized in any order you'd like.

## 2024-07-29 NOTE — Progress Notes (Signed)
 Ann Griffith                                          MRN: [id number]   07/29/2024   The [facility] Quality Team Specialist reviewed this patient medical record for the purposes of chart review for care gap closure. The following were reviewed: abstraction for care gap closure-controlling blood pressure.    [facility] Quality Team

## 2024-07-29 NOTE — Progress Notes (Signed)
 Subjective:    Patient ID: Ann Griffith, female    DOB: 73 y.o., June 30, 1951   MRN: [id number]  Chief Complaint: Left knee pain  Discussed the use of AI scribe software for clinical note transcription with the patient, who gave verbal consent to proceed.  History of Present Illness Patient is a pleasant 73 year old female with past medical history significant for Raynaud's, mitral regurg, hypertension, type 2 diabetes (last a1c 5.8 on 05/19/2024), and restless leg syndrome.  She also has stage III CKD listed, though GFR 67 and creatinine 0.9 when last evaluated on 07/16/2024.  She presents today specifically regarding posterior left knee pain  Patient reporting approximately 1 year history of pain in her posterior left knee.  Seems to surface most commonly at night and last for several hours Improves with stretching mostly or moving the leg up and down repeatedly. Interrupts her sleep She also has restless leg syndrome which seems to overlap with this symptom wise in terms of its timing No formal treatments tried for this apart from movement and stretching Takes maybe 30 minutes to 1 hour of stretching in order for it to improve No known inciting injury No numbness or tingling radiating down the leg No pain in anterior knee No history of surgery in left knee Sometimes will complain of this while sitting for prolonged periods during the day as well No other known inciting factors   Review of Pertinent Imaging: 4 view plain film radiographs obtained of the right knee on 07/19/2024 from independent review revealing moderate osteophytosis present along the superior and inferior patella.  Enthesophyte change noted at both the patellar tendon origin and quadriceps tendon insertion.  Fabella noted posteriorly and additional smaller opacity also posterior to the knee but which appears closer to the joint.  Prominent osteophyte noted along the anteriormost aspect of the tibia in the same plane as  the tibial spines.    Objective:   Vitals:   07/29/24 0920  BP: 130/78    Left Knee (compared to normal) -Inspection: No swelling, erythema, deformity or visible effusion. -Palpation: TTP + quad tendon, + patella, - patellar tendon, - tibial tuberosity, - pes bursa, - gerdy tubercle, - medial joint line, - lateral joint line, - posterior knee, minimally + TTP at lateral hamstring / IT band interface near posterolateral corner. No significant crepitus with flexion/extension. -AROM/PROM: 0 degrees extension, 140 degrees flexion, normal hamstring flexibility -Strength: 5/5 flexion, 5/5 extension -Special tests:    -ACL: - lachman, - lever test   -MCL: stable and painless with- valgus at 0/30 degrees   -LCL: 1-2+ and painless with varus at 0/30 degrees   -PCL: - sag sign   -Meniscus: - thessaly, - McMurray   -Patellofemoral: - patellar grind   - Posterior corner: Negative dial test     Assessment & Plan:   Assessment & Plan Christopher is a pleasant 73 year old female with a 1 year history of gradual, atraumatic, and predominantly nocturnal posterior left knee pain.  After reviewing her plain film radiographs, she certainly has arthritis in her knee, but this does not seem to be the cause of her peculiar pain.  Differential for this includes sequela of restless leg syndrome, resultant from the arthritis, popliteal artery entrapment, high gastrocnemius injury, low hamstring insertion injury.  She also does have 2 fabellae vs. 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 If the latter is present this could cause some pain but I find this less likely.  We discussed  options including salutary neglect, joint injection, dynamic vascular ultrasound, complete MSK ultrasound, oral anti-inflammatory, MRI.  Ultimately, we elected to initiate oral anti-inflammatory therapy and follow-up for a complete MSK ultrasound in 1-2 weeks.

## 2024-08-09 ENCOUNTER — Other Ambulatory Visit: Payer: Self-pay

## 2024-08-09 ENCOUNTER — Ambulatory Visit

## 2024-08-09 VITALS — BP 138/80 | Ht 62.0 in | Wt 154.0 lb

## 2024-08-09 DIAGNOSIS — G8929 Other chronic pain: Secondary | ICD-10-CM

## 2024-08-09 DIAGNOSIS — M1712 Unilateral primary osteoarthritis, left knee: Secondary | ICD-10-CM

## 2024-08-09 DIAGNOSIS — M25561 Pain in right knee: Secondary | ICD-10-CM

## 2024-08-09 DIAGNOSIS — M25562 Pain in left knee: Secondary | ICD-10-CM

## 2024-08-09 DIAGNOSIS — G2581 Restless legs syndrome: Secondary | ICD-10-CM | POA: Diagnosis not present

## 2024-08-09 MED ORDER — PREGABALIN 75 MG PO CAPS: 75.0000 mg | ORAL_CAPSULE | Freq: Every evening | ORAL | 0 refills | Status: AC

## 2024-08-09 NOTE — Progress Notes (Signed)
" ° °  Subjective:    Patient ID: Ann Griffith, female    DOB: 73 y.o., [birth year]   MRN: [id number]  Chief Complaint: Posterior left knee pain follow-up ultrasound  Discussed the use of AI scribe software for clinical note transcription with the patient, who gave verbal consent to proceed.  History of Present Illness Ann Griffith is a pleasant 73 year old female presenting to me for follow-up ultrasonographic evaluation of posterior left knee pain.  She does certainly have evidence of osteoarthritis on her plain film radiography of her left knee, however the posterior nature of this and nocturnal predominance of her symptoms seem to suggest more of a restless leg syndrome variant, dynamic popliteal artery entrapment, low hamstring injury, or high gastrocnemius injury rather than solely due to arthritis. Initiated oral anti-inflammatory therapy last visit.     Objective:   Vitals:   08/09/24 1103  BP: 138/80   Complete US  Examination of the left knee: The suprapatellar pouch is well-visualized and does not appear to carry hypoechogenic fluid contents. The quadriceps tendon is well-visualized and appears normal. The quadriceps tendon insertion has enthesophytic change present at the superior patellar pole. The patellar tendon origin has enthesophytic change present at the inferior patellar pole. The patella tendon is well-visualized and appears normal The medial meniscus is well-visualized and appears normal The lateral meniscus is well-visualized and appears to have fissuring present. The biceps femoris is well-visualized near its insertion and appears to have a small area of partial-thickness fiber disruption with increased Doppler flow. There is a fabella noted posterior laterally. Impression: - Small partial-thickness tear of biceps femoris tendon   Complete ultrasound evaluation of the right knee: Suprapatellar pouch is well-visualized and does not appear to carry hypoechogenic fluid  contents. The quadriceps tendon is well-visualized and appears normal. The quadriceps tendon insertion has enthesophytic change present at the superior patellar pole. The patellar tendon origin has enthesophytic change present at the inferior patellar pole. The patella tendon is well-visualized and appears normal. Medial meniscus well-visualized and appears to have some mild amount of fissuring present within the body of the meniscus. The lateral meniscus is well-visualized and appears to have fissuring present. The biceps femoris tendon is well-visualized near the insertion and appears normal. There is a fabella noted posterior laterally. The tibial nerve is visualized traversing superficial to the popliteal artery and there is prominent tenderness to sono palpation directly over it at the level of the fabella. Impression: -Potential tibial nerve irritation/entrapment  Ultrasound examination performed and interpreted by Redell Robes, DO      Assessment & Plan:   Assessment & Plan Given that Kineta has been on ropinirole  for many years and does have formally diagnosed restless leg syndrome, there is a chance that augmentation phenomenon has occurred.  We will attempt to initiate pregabalin  and taper her off of ropinirole  in that order respectively.  If this does not provide a sufficient relief of symptoms, would consider obtaining nerve conduction study to evaluate tibial nerve at the right knee and aggressively rehab hamstring tendon and left.   "

## 2024-08-10 ENCOUNTER — Other Ambulatory Visit: Payer: Self-pay

## 2024-08-10 NOTE — Progress Notes (Signed)
 Notified that pt's insurance denied coverage of lyrica  prescription written by myself on 08/09/24 visit. Called pt's insurance and filed formal expedited appeal. We will purportedly hear back within 72 hrs.   Redell Robes, DO CAQSM

## 2024-08-31 NOTE — Progress Notes (Signed)
 Ann Griffith                                          MRN: [id number]   08/31/2024   The [facility] Quality Team Specialist reviewed this patient medical record for the purposes of chart review for care gap closure. The following were reviewed: abstraction for care gap closure-controlling blood pressure.    [facility] Quality Team

## 2024-10-25 ENCOUNTER — Ambulatory Visit: Admitting: Physician Assistant

## 2024-11-02 ENCOUNTER — Ambulatory Visit: Admitting: Physical Medicine & Rehabilitation

## 2025-05-17 ENCOUNTER — Ambulatory Visit
